# Patient Record
Sex: Female | Born: 1990 | Race: Black or African American | Hispanic: No | Marital: Single | Smoking: Never smoker
Health system: Southern US, Community
[De-identification: ages and names within clinical notes are randomized; demographics above are authoritative.]

## PROBLEM LIST (undated history)

## (undated) ENCOUNTER — Inpatient Hospital Stay (HOSPITAL_COMMUNITY): Payer: Self-pay

## (undated) DIAGNOSIS — R188 Other ascites: Secondary | ICD-10-CM

## (undated) DIAGNOSIS — Z789 Other specified health status: Secondary | ICD-10-CM

## (undated) DIAGNOSIS — R1900 Intra-abdominal and pelvic swelling, mass and lump, unspecified site: Secondary | ICD-10-CM

## (undated) HISTORY — PX: NO PAST SURGERIES: SHX2092

## (undated) HISTORY — PX: THORACENTESIS: SHX235

## (undated) HISTORY — PX: WISDOM TOOTH EXTRACTION: SHX21

---
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
## 2008-02-11 ENCOUNTER — Ambulatory Visit: Payer: Self-pay | Admitting: Family Medicine

## 2008-07-02 ENCOUNTER — Ambulatory Visit: Payer: Self-pay | Admitting: Family Medicine

## 2008-07-02 LAB — CONVERTED CEMR LAB: Beta hcg, urine, semiquantitative: NEGATIVE

## 2008-08-20 ENCOUNTER — Encounter (INDEPENDENT_AMBULATORY_CARE_PROVIDER_SITE_OTHER): Payer: Self-pay | Admitting: Family Medicine

## 2008-08-20 ENCOUNTER — Ambulatory Visit: Payer: Self-pay | Admitting: Family Medicine

## 2008-08-21 LAB — CONVERTED CEMR LAB
Chlamydia, DNA Probe: POSITIVE — AB
GC Probe Amp, Genital: NEGATIVE

## 2008-08-23 ENCOUNTER — Telehealth: Payer: Self-pay | Admitting: *Deleted

## 2008-08-23 ENCOUNTER — Ambulatory Visit: Payer: Self-pay | Admitting: Family Medicine

## 2008-09-30 ENCOUNTER — Ambulatory Visit: Payer: Self-pay | Admitting: Family Medicine

## 2008-12-30 ENCOUNTER — Ambulatory Visit: Payer: Self-pay | Admitting: Family Medicine

## 2009-01-17 ENCOUNTER — Ambulatory Visit: Payer: Self-pay | Admitting: Family Medicine

## 2009-05-03 ENCOUNTER — Telehealth (INDEPENDENT_AMBULATORY_CARE_PROVIDER_SITE_OTHER): Payer: Self-pay | Admitting: *Deleted

## 2009-06-27 ENCOUNTER — Ambulatory Visit: Payer: Self-pay | Admitting: Family Medicine

## 2009-06-27 LAB — CONVERTED CEMR LAB: Beta hcg, urine, semiquantitative: NEGATIVE

## 2009-08-29 ENCOUNTER — Encounter: Payer: Self-pay | Admitting: Family Medicine

## 2009-08-29 ENCOUNTER — Ambulatory Visit: Payer: Self-pay | Admitting: Family Medicine

## 2009-08-29 LAB — CONVERTED CEMR LAB: GC Probe Amp, Genital: NEGATIVE

## 2009-09-23 ENCOUNTER — Ambulatory Visit: Payer: Self-pay | Admitting: Family Medicine

## 2009-12-16 ENCOUNTER — Ambulatory Visit: Payer: Self-pay | Admitting: Family Medicine

## 2009-12-20 ENCOUNTER — Encounter: Payer: Self-pay | Admitting: Family Medicine

## 2009-12-21 ENCOUNTER — Ambulatory Visit: Payer: Self-pay | Admitting: Family Medicine

## 2009-12-21 DIAGNOSIS — B359 Dermatophytosis, unspecified: Secondary | ICD-10-CM | POA: Insufficient documentation

## 2010-05-09 NOTE — Assessment & Plan Note (Signed)
Summary: depo inj,tcb  Nurse Visit   Allergies: No Known Drug Allergies  Medication Administration  Injection # 1:    Medication: Depo-Provera 150mg     Diagnosis: CONTRACEPTIVE MANAGEMENT (ICD-V25.09)    Route: IM    Site: RUOQ gluteus    Exp Date: 06/2012    Lot #: [id number]    Mfr: [email or web address]    Comments: next depo due Nov 24 through Mar 16, 2010    Patient tolerated injection without complications    Given by: Theresia Lo RN (December 16, 2009 10:55 AM)  Orders Added: 1)  Depo-Provera 150mg  [J1055] 2)  Admin of Injection (IM/SQ) [84696]   Medication Administration  Injection # 1:    Medication: Depo-Provera 150mg     Diagnosis: CONTRACEPTIVE MANAGEMENT (ICD-V25.09)    Route: IM    Site: RUOQ gluteus    Exp Date: 06/2012    Lot #: [id number]    Mfr: [email or web address]    Comments: next depo due Nov 24 through Mar 16, 2010    Patient tolerated injection without complications    Given by: Theresia Lo RN (December 16, 2009 10:55 AM)  Orders Added: 1)  Depo-Provera 150mg  [J1055] 2)  Admin of Injection (IM/SQ) [13244]

## 2010-05-09 NOTE — Miscellaneous (Signed)
Summary: depo update  Clinical Lists Changes  Medications: Added new medication of DEPO-PROVERA 150 MG/ML SUSP (MEDROXYPROGESTERONE ACETATE) inject IM q 3 months

## 2010-05-09 NOTE — Progress Notes (Signed)
Summary: Shot Req  Phone Note Call from Patient Call back at [employer] [phone]   Caller: Patient Summary of Call: Would like copy of her shot records. Initial call taken by: Clydell Hakim,  May 03, 2009 10:15 AM  Follow-up for Phone Call        patient notified that record is ready to pick up. Follow-up by: Theresia Lo RN,  May 03, 2009 11:44 AM

## 2010-05-09 NOTE — Assessment & Plan Note (Signed)
Summary: 20 y/o CPE   Vital Signs:  Patient profile:   20 year old female Height:      62.5 inches Weight:      180.4 pounds BMI:     32.59 Temp:     98.4 degrees F oral Pulse rate:   70 / minute BP sitting:   109 / 70  Nutrition Counseling: Patient's BMI is greater than 25 and therefore counseled on weight management options.  Habits & Providers  Alcohol-Tobacco-Diet     Alcohol drinks/day: 0     Tobacco Status: never  Exercise-Depression-Behavior     Does Patient Exercise: no     Exercise Counseling: to improve exercise regimen     Have you felt down or hopeless? no     Have you felt little pleasure in things? no     STD Risk: past     STD Risk Counseling: to avoid increased STD risk     Drug Use: never     Seat Belt Use: always  Well Child Visit/Preventive Care  Age:  20 years old female Patient lives with: grandmother, 3 sibs Concerns: headaches frequently. tylenol and advil help. h/o migraine treated with imitrex in the past  Home:     good family relationships Education:     doing well in school.  Activities:     > 2 hrs TV/Computer Auto/Safety:     seatbelts Diet:     balanced diet and dental hygiene/visit addressed Drugs:     no tobacco use, no alcohol use, and no drug use Sex:     dating Suicide risk:     emotionally healthy, denies feelings of depression, and denies suicidal ideation  Past History:  Past medical, surgical, family and social histories (including risk factors) reviewed, and no changes noted (except as noted below).  Past Medical History: Reviewed history from 08/20/2008 and no changes required. 1/06 Wt= 123 lbs, Ht=5`1`, FT NSVD uncomplicated (patient has never been pregnant), prenatal valproate exposure, menstrual migraine, no hospitalizations   migraines  Past Surgical History: Reviewed history from 08/20/2008 and no changes required. none  Family History: Reviewed history from 08/20/2008 and no changes  required. gparents- HTN, DM, Mom DM, HTN, HLD.  sister healthy      Social History: Reviewed history from 08/20/2008 and no changes required. Lives with grandma and 3 sisters.  Mom is [place], sees occasionally. Currently has boyfriend in prison, so not currently sexually active. No tobacco, EtoH, rec drugs.  [employer] for criminal justice.  Supportive environment.   Goes walking sometimes. Sometimes she runs 15-20 minutes once a week.  Drug Use:  never STD Risk:  past Seat Belt Use:  always Does Patient Exercise:  no  Physical Exam  General:      obese female, NAD. vitals reviewed.  Eyes:      PERRL, EOMI,  fundi normal Ears:      TM's pearly gray with normal light reflex and landmarks, canals clear  Nose:      Clear without Rhinorrhea Mouth:      Clear without erythema, edema or exudate, mucous membranes moist Neck:      supple without adenopathy  Lungs:      Clear to ausc, no crackles, rhonchi or wheezing, no grunting, flaring or retractions  Heart:      RRR without murmur  Abdomen:      BS+, soft, non-tender, no masses, no hepatosplenomegaly  Genitalia:      Pelvic Exam External: normal female genitalia  without lesions or masses Vagina: normal without lesions or masses Cervix: normal without lesions or masses Adnexa: normal bimanual exam without masses or fullness Uterus: normal by palpation Pap smear: not performed Musculoskeletal:      no scoliosis, normal gait, normal posture Pulses:      femoral pulses present  Extremities:      Well perfused with no cyanosis or deformity noted  Neurologic:      Neurologic exam grossly intact  Developmental:      alert and cooperative  Skin:      intact without lesions, rashes  Psychiatric:      alert and cooperative   Impression & Recommendations:  Problem # 1:  PHYSICAL EXAMINATION (ICD-V70.0)  GC/Chl performed. wet prep unremarkable. no pap needed. f/u in 1 year.   Orders: [facility] - Est  18-39 yrs (04540)  Problem  # 2:  MIGRAINE, UNSPEC., W/O INTRACTABLE MIGRAINE (ICD-346.90) Assessment: Deteriorated refill sumatriptan, and rx for ibuprofen provided.   Her updated medication list for this problem includes:    Sumatriptan Succinate 50 Mg Tabs (Sumatriptan succinate) .Marland Kitchen... 1 tablet at onset of headache, may repeat again after 2 hours if headache still present.  max 4 tablets in 1 day.    Ibuprofen 800 Mg Tabs (Ibuprofen) ..... One tab by mouth q8 hours as needed for headache  Other Orders: GC/Chlamydia-[facility] (87591/87491) [facility]  Patient Instructions: 1)  It is important that you exercise reguarly at least 20 minutes 5 times a week. If you develop chest pain, have severe difficulty breathing, or feel very tired, stop exercising immediately and seek medical attention.  2)  You need to lose weight. Consider a lower calorie diet and regular exercise.  3)  If you could be exposed to sexually transmitted diseases. you should use a condom.  Prescriptions: SUMATRIPTAN SUCCINATE 50 MG TABS (SUMATRIPTAN SUCCINATE) 1 tablet at onset of headache, may repeat again after 2 hours if headache still present.  max 4 tablets in 1 day.  #12 x 1   Entered and Authorized by:   Lequita Asal  MD   Signed by:   Lequita Asal  MD on 08/29/2009   Method used:   Electronically to        [facility] (retail)       [address]       [place], [place]  [id number]       Ph: [phone]       Fax: [phone]   RxID:   [id number] IBUPROFEN 800 MG TABS (IBUPROFEN) one tab by mouth q8 hours as needed for headache  #90 x 1   Entered and Authorized by:   Lequita Asal  MD   Signed by:   Lequita Asal  MD on 08/29/2009   Method used:   Electronically to        [facility] [address] [address] (retail)       [address]       [place], [place]  [id number]       Ph: [phone]       Fax: [phone]   RxID:   [id number]  ] Laboratory Results  Date/Time  Received: Aug 29, 2009 10:46 AM  Date/Time Reported: Aug 29, 2009 10:56 AM   [employer] Source: VAG WBC/hpf: >20 Bacteria/hpf: 3+  Rods Clue cells/hpf: none  Negative whiff Yeast/hpf: none Trichomonas/hpf: none Comments: ...............test performed by......Marland KitchenBonnie A. Swaziland, MLS (ASCP)cm

## 2010-05-09 NOTE — Assessment & Plan Note (Signed)
Summary: spot on leg,tcb   Vital Signs:  Patient profile:   20 year old female Height:      62.5 inches Weight:      175.9 pounds BMI:     31.77 Temp:     98.5 degrees F oral Pulse rate:   64 / minute BP sitting:   107 / 65  (left arm) Cuff size:   regular  Vitals Entered By: Garen Grams LPN (December 21, 2009 2:38 PM) CC: knot on leg x 2 weeks Is Patient Diabetic? No Pain Assessment Patient in pain? no        Primary Care Provider:  Sarah Swaziland MD  CC:  knot on leg x 2 weeks.  History of Present Illness: 20 yo here for evaluation of spot on right leg x 2 weeks.  Itchy round spot on anterior right leg.  no fevers, recent outdoor acitvity, pets, or household members with similar spots.  no other areas ntoed.  Has not tried anythign on the area for treatment.  Habits & Providers  Alcohol-Tobacco-Diet     Alcohol drinks/day: 0     Tobacco Status: never  Current Medications (verified): 1)  Sumatriptan Succinate 50 Mg Tabs (Sumatriptan Succinate) .Marland Kitchen.. 1 Tablet At Onset of Headache, May Repeat Again After 2 Hours If Headache Still Present.  Max 4 Tablets in 1 Day. 2)  Ibuprofen 800 Mg Tabs (Ibuprofen) .... One Tab By Mouth Q8 Hours As Needed For Headache 3)  Depo-Provera 150 Mg/ml Susp (Medroxyprogesterone Acetate) .... Inject Im Q 3 Months  Allergies: No Known Drug Allergies PMH-FH-SH reviewed for relevance  Review of Systems      See HPI  Physical Exam  General:  obese female, NAD. vitals reviewed.  Skin:  approx 2 cm diameter red macule consistant with ringworm   Impression & Recommendations:  Problem # 1:  RINGWORM (ICD-110.9)  isolated lesion.  patient given hadnout from [email or web address] and advised to use lamisil OTC on area.  return if no improvement.  Orders: [facility]- Est Level  3 (95621)  Complete Medication List: 1)  Sumatriptan Succinate 50 Mg Tabs (Sumatriptan succinate) .Marland Kitchen.. 1 tablet at onset of headache, may repeat again after 2 hours if  headache still present.  max 4 tablets in 1 day. 2)  Ibuprofen 800 Mg Tabs (Ibuprofen) .... One tab by mouth q8 hours as needed for headache 3)  Depo-provera 150 Mg/ml Susp (Medroxyprogesterone acetate) .... Inject im q 3 months

## 2010-05-09 NOTE — Assessment & Plan Note (Signed)
Summary: depo,df  Nurse Visit   Allergies: No Known Drug Allergies  Medication Administration  Injection # 1:    Medication: Depo-Provera 150mg     Diagnosis: CONTRACEPTIVE MANAGEMENT (ICD-V25.09)    Route: IM    Site: RUOQ gluteus    Exp Date: 05/2012    Lot #: [id number]    Mfr: [email or web address]    Comments: next depo due Sept 2 thru Sept 16, 2011    Patient tolerated injection without complications    Given by: Theresia Lo RN (September 23, 2009 9:41 AM)  Orders Added: 1)  Depo-Provera 150mg  [J1055] 2)  Admin of Injection (IM/SQ) [11914]   Medication Administration  Injection # 1:    Medication: Depo-Provera 150mg     Diagnosis: CONTRACEPTIVE MANAGEMENT (ICD-V25.09)    Route: IM    Site: RUOQ gluteus    Exp Date: 05/2012    Lot #: [id number]    Mfr: [email or web address]    Comments: next depo due Sept 2 thru Sept 16, 2011    Patient tolerated injection without complications    Given by: Theresia Lo RN (September 23, 2009 9:41 AM)  Orders Added: 1)  Depo-Provera 150mg  [J1055] 2)  Admin of Injection (IM/SQ) [21308]

## 2010-05-09 NOTE — Assessment & Plan Note (Signed)
Summary: UPREG/DEPO/KH  Nurse Visit   Allergies: No Known Drug Allergies Laboratory Results   Urine Tests  Date/Time Received: June 27, 2009 1:31 PM  Date/Time Reported: June 27, 2009 1:40 PM     Urine HCG: negative Comments: ...........test performed by...........Marland KitchenTerese Door, CMA     Medication Administration  Injection # 1:    Medication: Depo-Provera 150mg     Diagnosis: CONTRACEPTIVE MANAGEMENT (ICD-V25.09)    Route: IM    Site: LUOQ gluteus    Exp Date: 08/2011    Lot #: [id number]    Mfr: [email or web address]    Comments: next depo due June 6 trhu September 26, 2009    Patient tolerated injection without complications    Given by: Theresia Lo RN (June 27, 2009 2:04 PM)  Orders Added: 1)  U Preg-[facility] [81025] 2)  Depo-Provera 150mg  [J1055] 3)  Admin of Injection (IM/SQ) [78469]    Medication Administration  Injection # 1:    Medication: Depo-Provera 150mg     Diagnosis: CONTRACEPTIVE MANAGEMENT (ICD-V25.09)    Route: IM    Site: LUOQ gluteus    Exp Date: 08/2011    Lot #: [id number]    Mfr: [email or web address]    Comments: next depo due June 6 trhu September 26, 2009    Patient tolerated injection without complications    Given by: Theresia Lo RN (June 27, 2009 2:04 PM)  Orders Added: 1)  U Preg-[facility] [81025] 2)  Depo-Provera 150mg  [J1055] 3)  Admin of Injection (IM/SQ) [41324]  patient denies any sexual  activity in several months. Depo given today with instruction to use extra protection for next 7 days if she does have sex. Theresia Lo RN  June 27, 2009 2:06 PM

## 2010-05-19 ENCOUNTER — Ambulatory Visit: Payer: Self-pay

## 2010-05-23 ENCOUNTER — Ambulatory Visit: Payer: Self-pay

## 2010-08-16 ENCOUNTER — Ambulatory Visit: Payer: Self-pay

## 2010-08-31 ENCOUNTER — Other Ambulatory Visit: Payer: Self-pay | Admitting: Family Medicine

## 2010-08-31 MED ORDER — IBUPROFEN 800 MG PO TABS: 800.0000 mg | ORAL_TABLET | Freq: Three times a day (TID) | ORAL | Status: DC | PRN

## 2010-10-12 ENCOUNTER — Other Ambulatory Visit: Payer: Self-pay | Admitting: Family Medicine

## 2010-11-16 ENCOUNTER — Ambulatory Visit (INDEPENDENT_AMBULATORY_CARE_PROVIDER_SITE_OTHER): Payer: Medicaid Other | Admitting: Family Medicine

## 2010-11-16 DIAGNOSIS — Z3009 Encounter for other general counseling and advice on contraception: Secondary | ICD-10-CM | POA: Insufficient documentation

## 2010-11-16 DIAGNOSIS — Z309 Encounter for contraceptive management, unspecified: Secondary | ICD-10-CM

## 2010-11-16 DIAGNOSIS — G43909 Migraine, unspecified, not intractable, without status migrainosus: Secondary | ICD-10-CM

## 2010-11-16 MED ORDER — MEDROXYPROGESTERONE ACETATE 150 MG/ML IM SUSP: 150.0000 mg | Freq: Once | INTRAMUSCULAR | Status: DC

## 2010-11-16 NOTE — Assessment & Plan Note (Signed)
Headaches 1-2/month.  No red flags, just like previous migraines.  Does well with ibuprofen.  Will refill

## 2010-11-16 NOTE — Progress Notes (Signed)
  Subjective:    Patient ID: Brandy Knight, female    DOB: 10/31/1990, 20 y.o.   MRN: [id number]  HPI HA- migraines 1-2/ month.  They are stronger this month but the character is the same.  She will occasionaly vomit with bad migraines.  The ibuprofen helps and she needs a refill     Review of Systems    Denies CP, SOB, N/V/D, fever  Objective:   Physical Exam  Vital signs reviewed General appearance - alert, well appearing, and in no distress and oriented to person, place, and time Neurological - alert, oriented, normal speech, no focal findings or movement disorder noted, screening mental status exam normal, neck supple without rigidity, cranial nerves II through XII intact       Assessment & Plan:  Family planning Liked depo, would like to start again.  Will restart today. upreg neg  MIGRAINE, UNSPEC., W/O INTRACTABLE MIGRAINE Headaches 1-2/month.  No red flags, just like previous migraines.  Does well with ibuprofen.  Will refill

## 2010-11-16 NOTE — Assessment & Plan Note (Signed)
Liked depo, would like to start again.  Will restart today. upreg neg

## 2011-02-05 ENCOUNTER — Ambulatory Visit: Payer: Medicaid Other | Admitting: Family Medicine

## 2012-07-04 ENCOUNTER — Ambulatory Visit (INDEPENDENT_AMBULATORY_CARE_PROVIDER_SITE_OTHER): Payer: Medicaid Other | Admitting: Family Medicine

## 2012-07-04 VITALS — BP 100/58 | HR 72 | Temp 98.3°F | Ht 62.0 in | Wt 176.8 lb

## 2012-07-04 DIAGNOSIS — J029 Acute pharyngitis, unspecified: Secondary | ICD-10-CM

## 2012-07-04 DIAGNOSIS — B9789 Other viral agents as the cause of diseases classified elsewhere: Secondary | ICD-10-CM

## 2012-07-04 DIAGNOSIS — J028 Acute pharyngitis due to other specified organisms: Secondary | ICD-10-CM | POA: Insufficient documentation

## 2012-07-04 DIAGNOSIS — R070 Pain in throat: Secondary | ICD-10-CM

## 2012-07-04 NOTE — Patient Instructions (Signed)
I am sorry your throat hurts- I think you have a virus causing your sore throat.  Please drink plenty of fluids.  You can take tylenol or ibuprofen for the pain, and try chloraseptic sore throat spray.

## 2012-07-04 NOTE — Assessment & Plan Note (Signed)
Rapid strep negative, Discussed viral pharyngitis and supportive care (see pt instructions).  F/U if not improving in 1 week.

## 2012-07-04 NOTE — Progress Notes (Signed)
  Subjective:    Patient ID: Brandy Knight, female    DOB: Jun 25, 1990, 22 y.o.   MRN: [id number]  HPI  Arina comes in with a sore throat that has been going on for 3-4 days.  She says she noticed a little discomfort at work a few days ago and thought she had just not drank enough water.  Then the next morning she says it was very sore and her voice is quieter than normal.  It is uncomfortable to swallow. She has felt generally sick but not had a fever.  No sick contacts but she does work at [employer].    Review of Systems See HPI    Objective:   Physical Exam BP 100/58  Pulse 72  Temp(Src) 98.3 F (36.8 C) (Oral)  Ht 5\' 2"  (1.575 m)  Wt 176 lb 12.8 oz (80.196 kg)  BMI 32.33 kg/m2  LMP 06/23/2012 General appearance: alert, cooperative and no distress Ears: normal TM's and external ear canals both ears Nose: mild congestion Throat: Mild erythema on tonsills but no exudates, oral mucosa moist Neck: mild anterior cervical adenopathy and supple, symmetrical, trachea midline Lungs: clear to auscultation bilaterally Heart: regular rate and rhythm, S1, S2 normal, no murmur, click, rub or gallop       Assessment & Plan:

## 2012-08-04 ENCOUNTER — Ambulatory Visit: Payer: Medicaid Other | Admitting: Family Medicine

## 2013-04-09 NOTE — L&D Delivery Note (Signed)
Delivery Note At 7:15 AM a viable female was delivered via Vaginal, Spontaneous Delivery (Presentation: Left Occiput Anterior).  APGAR: 9, 9; weight pending.   Placenta status: Intact, Spontaneous.  Cord: 3 vessels with the following complications: None.  Cord pH: N/A  Anesthesia: None  Episiotomy: None Lacerations: 2nd degree;Perineal Suture Repair: 3.0 vicryl Est. Blood Loss (mL): 300 ml   Mom to postpartum.  Baby to Couplet care / Skin to Skin.  Kennith Maes 09/16/2013, 7:55 AM    I was present for this delivery and agree with the above resident's note.  [place], [place] Certified Nurse-Midwife

## 2013-04-16 ENCOUNTER — Ambulatory Visit (INDEPENDENT_AMBULATORY_CARE_PROVIDER_SITE_OTHER): Payer: Medicaid Other | Admitting: Family Medicine

## 2013-04-16 VITALS — BP 111/69 | HR 93 | Temp 98.6°F | Ht 62.0 in | Wt 152.0 lb

## 2013-04-16 DIAGNOSIS — N912 Amenorrhea, unspecified: Secondary | ICD-10-CM

## 2013-04-16 DIAGNOSIS — Z331 Pregnant state, incidental: Secondary | ICD-10-CM

## 2013-04-16 DIAGNOSIS — Z3490 Encounter for supervision of normal pregnancy, unspecified, unspecified trimester: Secondary | ICD-10-CM

## 2013-04-16 LAB — POCT URINE PREGNANCY: Preg Test, Ur: POSITIVE

## 2013-04-16 NOTE — Progress Notes (Signed)
Family Medicine Office Visit Note   Subjective:   Patient ID: Brandy Knight, female  DOB: [birth year], 23 y.o.. MRN: [id number]   Pt that comes today for same-day appointment complaining of amenorrhea for ~ 4 months. She reports her LMP was sometime in September, then she did not have her period in October and had some spotting in Nov and Dec. She denies nausea, vomiting or other symptoms. She thinks is pregnant an would like to get tested for it.  Denies vaginal discharge,or leakage.   Review of Systems:  Pt denies SOB, chest pain, palpitations, headaches, dizziness, numbness or weakness. No changes on urinary or BM habits. Weight loss of 24 Lb in 9 months.   Objective:   Physical Exam: Gen:  NAD HEENT: Moist mucous membranes  CV: Regular rate and rhythm, no murmurs PULM: Normal effort. Clear to auscultation bilaterally.  ABD: Soft, non tender, non distended, normal bowel sounds. FHT present on doppler exam (125bpm). Uterine size felt to be halfway between umbilicus and pubic symphysis.   EXT: No edema Neuro: Alert and oriented x3.  Assessment & Plan:

## 2013-04-16 NOTE — Patient Instructions (Signed)
Congratulations in your pregnancy! Since your period was irregular and there is heart beat in our exam you need to get ultrasound in order to determine the date of pregnancy.  You also need to come for prenatal labs and after that you will be scheduled for your first OB visit here.

## 2013-04-17 DIAGNOSIS — O0990 Supervision of high risk pregnancy, unspecified, unspecified trimester: Secondary | ICD-10-CM | POA: Insufficient documentation

## 2013-04-17 DIAGNOSIS — Z3490 Encounter for supervision of normal pregnancy, unspecified, unspecified trimester: Secondary | ICD-10-CM | POA: Insufficient documentation

## 2013-04-17 NOTE — Assessment & Plan Note (Signed)
Pregnancy test positive. Pt with no planned pregnancy but happy with results and desires to continue pregnancy. Unreliable dating. Ultrasound ordered. Instructed to come for Nurse Visit in order to get OB labs drawn and then start corresponding prenatal care. She wishes to f/u in our [facility] clinic.

## 2013-04-20 ENCOUNTER — Ambulatory Visit (HOSPITAL_COMMUNITY): Payer: Medicaid Other

## 2013-04-27 ENCOUNTER — Ambulatory Visit (HOSPITAL_COMMUNITY): Payer: Medicaid Other

## 2013-04-30 ENCOUNTER — Encounter (HOSPITAL_COMMUNITY): Payer: Self-pay

## 2013-04-30 ENCOUNTER — Inpatient Hospital Stay (HOSPITAL_COMMUNITY)
Admission: AD | Admit: 2013-04-30 | Discharge: 2013-04-30 | Disposition: A | Discharge status: Home / Self Care | Payer: Medicaid Other | Source: Ambulatory Visit | Attending: Obstetrics & Gynecology | Admitting: Obstetrics & Gynecology

## 2013-04-30 DIAGNOSIS — O9989 Other specified diseases and conditions complicating pregnancy, childbirth and the puerperium: Principal | ICD-10-CM

## 2013-04-30 DIAGNOSIS — R109 Unspecified abdominal pain: Secondary | ICD-10-CM | POA: Insufficient documentation

## 2013-04-30 DIAGNOSIS — O99891 Other specified diseases and conditions complicating pregnancy: Secondary | ICD-10-CM | POA: Insufficient documentation

## 2013-04-30 DIAGNOSIS — N949 Unspecified condition associated with female genital organs and menstrual cycle: Secondary | ICD-10-CM

## 2013-04-30 HISTORY — DX: Other specified health status: Z78.9

## 2013-04-30 LAB — URINALYSIS, ROUTINE W REFLEX MICROSCOPIC
Bilirubin Urine: NEGATIVE
Glucose, UA: NEGATIVE mg/dL
Hgb urine dipstick: NEGATIVE
Ketones, ur: NEGATIVE mg/dL
Leukocytes, UA: NEGATIVE
Nitrite: NEGATIVE
PH: 8 (ref 5.0–8.0)
PROTEIN: NEGATIVE mg/dL
SPECIFIC GRAVITY, URINE: 1.02 (ref 1.005–1.030)
UROBILINOGEN UA: 0.2 mg/dL (ref 0.0–1.0)

## 2013-04-30 LAB — WET PREP, GENITAL
Clue Cells Wet Prep HPF POC: NONE SEEN
Trich, Wet Prep: NONE SEEN
Yeast Wet Prep HPF POC: NONE SEEN

## 2013-04-30 LAB — OB RESULTS CONSOLE GC/CHLAMYDIA
Chlamydia: NEGATIVE
Gonorrhea: NEGATIVE

## 2013-04-30 MED ORDER — ACETAMINOPHEN 500 MG PO TABS: 1000.0000 mg | ORAL_TABLET | Freq: Once | ORAL | Status: DC

## 2013-04-30 NOTE — MAU Provider Note (Signed)
Attestation of Attending Supervision of Advanced Practitioner (CNM/NP): Evaluation and management procedures were performed by the Advanced Practitioner under my supervision and collaboration. I have reviewed the Advanced Practitioner's note and chart, and I agree with the management and plan.  LEGGETT,KELLY H. 4:45 PM

## 2013-04-30 NOTE — MAU Note (Signed)
Patient states she has been having lower abdominal pain for about one week off and on. Denies bleeding, nausea or vomiting, sore throat or cough.

## 2013-04-30 NOTE — MAU Provider Note (Signed)
History     CSN: [id number]  Arrival date and time: 04/30/13 1337   First Provider Initiated Contact with Patient 04/30/13 1414      Chief Complaint  Patient presents with  . Abdominal Pain   HPI  Brandy Knight is a 23 yo G1P0 at [redacted]w[redacted]d according to her LMP who presents with lower abdominal pain x 3 days.  Patient reports the pain as sharp, intermittent and radiates to her back. States the pain is worse while lying down and is somewhat relieved while standing.  She denies GI, urinary and vaginal bleeding and/or discharge.  OB History   Grav Para Term Preterm Abortions TAB SAB Ect Mult Living   1               Past Medical History  Diagnosis Date  . Medical history non-contributory     Past Surgical History  Procedure Laterality Date  . Wisdom tooth extraction      History reviewed. No pertinent family history.  History  Substance Use Topics  . Smoking status: Never Smoker   . Smokeless tobacco: Not on file  . Alcohol Use: No    Allergies: No Known Allergies  Facility-administered medications prior to admission  Medication Dose Route Frequency Provider Last Rate Last Dose  . medroxyPROGESTERone (DEPO-PROVERA) injection 150 mg  150 mg Intramuscular Once Judithann Sheen, MD       No prescriptions prior to admission    Review of Systems  Constitutional: Negative for fever and chills.  Eyes: Negative for blurred vision.  Respiratory: Negative for cough.   Cardiovascular: Negative for orthopnea and leg swelling.  Gastrointestinal: Positive for abdominal pain. Negative for nausea, vomiting, diarrhea and constipation.  Genitourinary: Negative for dysuria, urgency, frequency, hematuria and flank pain.  Musculoskeletal: Positive for back pain.  Neurological: Negative for dizziness.   Physical Exam   Blood pressure 111/63, pulse 77, resp. rate 16, height 5' 2.5" (1.588 m), weight 67.677 kg (149 lb 3.2 oz), last menstrual period 01/05/2013, SpO2  100.00%.  Physical Exam  Constitutional: She appears well-developed and well-nourished. No distress.  HENT:  Head: Normocephalic.  Cardiovascular: Normal rate and normal heart sounds.   Respiratory: Effort normal and breath sounds normal.  GI: Soft. Bowel sounds are normal. There is tenderness in the right lower quadrant and left lower quadrant.  Genitourinary:  Speculum exam:  Vagina:  No blood noted in vault.  No erythema. No tenderness. Small amount of thin, white discharge noted.  Cervix:  No blood/fluid seen from os. No external erythema or lesions.  Bimanual exam:  Uterus:  Non tender Cervix: No CMT Adnexal: No tenderness and/or masses noted. Wet prep and GC/Chlamydia collected. Chaperone present for exam    MAU Course  Procedures  MDM FHR - 150 bpm with doppler Urinalysis Wet prep  GC/Chlamydia    Assessment and Plan   Assessment:  #Round ligament pain-Intermittent lower abdominal pain that is positional and presents in the second trimester is indicative of round ligament pain.  Common infectious etiologies have been ruled out with negative urinalysis and wet prep.  GC/chlamydia is pending.   Plan:  1. Discharge in stable condition 2. Reassurance and instruction on common remedies for round ligament pain such as OTC acetaminophen, warm bath/shower and abdominal binder. 3. Establish prenatal care in the [facility] at [facility]. Referral sent to [facility] clinic. They will call the patient with an appointment in [place] 4. Patient given note with lifting restrictions for work 5. Patient  given pregnancy confirmation letter 6. Return to the MAU as needed or if condition were to change or worsen  Toil[place], Tifi 04/30/2013, 2:30 PM   I have seen and evaluated the patient with the PA student. I agree with the assessment and plan as written above.   Farris Has, PA-C 04/30/2013 3:12 PM

## 2013-04-30 NOTE — Discharge Instructions (Signed)
Abdominal Pain During Pregnancy  Belly (abdominal) pain is common during pregnancy. Most of the time, it is not a serious problem. Other times, it can be a sign that something is wrong with the pregnancy. Always tell your doctor if you have belly pain.  HOME CARE  Monitor your belly pain for any changes. The following actions may help you feel better:  · Do not have sex (intercourse) or put anything in your vagina until you feel better.  · Rest until your pain stops.  · Drink clear fluids if you feel sick to your stomach (nauseous). Do not eat solid food until you feel better.  · Only take medicine as told by your doctor.  · Keep all doctor visits as told.  GET HELP RIGHT AWAY IF:   · You are bleeding, leaking fluid, or pieces of tissue come out of your vagina.  · You have more pain or cramping.  · You keep throwing up (vomiting).  · You have pain when you pee (urinate) or have blood in your pee.  · You have a fever.  · You do not feel your baby moving as much.  · You feel very weak or feel like passing out.  · You have trouble breathing, with or without belly pain.  · You have a very bad headache and belly pain.  · You have fluid leaking from your vagina and belly pain.  · You keep having watery poop (diarrhea).  · Your belly pain does not go away after resting, or the pain gets worse.  MAKE SURE YOU:   · Understand these instructions.  · Will watch your condition.  · Will get help right away if you are not doing well or get worse.  Document Released: 03/14/2009 Document Revised: 11/26/2012 Document Reviewed: 10/23/2012  Exit[employer]® Patient Information ©2014 [employer].

## 2013-05-01 LAB — GC/CHLAMYDIA PROBE AMP
CT Probe RNA: NEGATIVE
GC PROBE AMP APTIMA: NEGATIVE

## 2013-05-12 ENCOUNTER — Other Ambulatory Visit: Payer: Medicaid Other

## 2013-05-12 DIAGNOSIS — Z331 Pregnant state, incidental: Secondary | ICD-10-CM

## 2013-05-12 NOTE — Progress Notes (Signed)
OB LABS DONE TODAY Brandy Knight

## 2013-05-13 LAB — OBSTETRIC PANEL
ANTIBODY SCREEN: NEGATIVE
BASOS PCT: 0 % (ref 0–1)
Basophils Absolute: 0 10*3/uL (ref 0.0–0.1)
EOS ABS: 0.1 10*3/uL (ref 0.0–0.7)
Eosinophils Relative: 1 % (ref 0–5)
HEMATOCRIT: 30.9 % — AB (ref 36.0–46.0)
Hemoglobin: 10.4 g/dL — ABNORMAL LOW (ref 12.0–15.0)
Hepatitis B Surface Ag: NEGATIVE
Lymphocytes Relative: 17 % (ref 12–46)
Lymphs Abs: 1.8 10*3/uL (ref 0.7–4.0)
MCH: 25.3 pg — AB (ref 26.0–34.0)
MCHC: 33.7 g/dL (ref 30.0–36.0)
MCV: 75.2 fL — AB (ref 78.0–100.0)
Monocytes Absolute: 0.7 10*3/uL (ref 0.1–1.0)
Monocytes Relative: 6 % (ref 3–12)
NEUTROS ABS: 8.1 10*3/uL — AB (ref 1.7–7.7)
NEUTROS PCT: 76 % (ref 43–77)
PLATELETS: 189 10*3/uL (ref 150–400)
RBC: 4.11 MIL/uL (ref 3.87–5.11)
RDW: 15.9 % — ABNORMAL HIGH (ref 11.5–15.5)
RH TYPE: POSITIVE
Rubella: 0.87 Index (ref <0.90)
WBC: 10.7 10*3/uL — ABNORMAL HIGH (ref 4.0–10.5)

## 2013-05-13 LAB — SICKLE CELL SCREEN: SICKLE CELL SCREEN: NEGATIVE

## 2013-05-13 LAB — HIV ANTIBODY (ROUTINE TESTING W REFLEX): HIV: NONREACTIVE

## 2013-05-14 LAB — CULTURE, OB URINE

## 2013-05-18 ENCOUNTER — Encounter: Payer: Self-pay | Admitting: Family Medicine

## 2013-05-18 ENCOUNTER — Ambulatory Visit (INDEPENDENT_AMBULATORY_CARE_PROVIDER_SITE_OTHER): Payer: Medicaid Other | Admitting: Family Medicine

## 2013-05-18 ENCOUNTER — Other Ambulatory Visit (HOSPITAL_COMMUNITY)
Admission: RE | Admit: 2013-05-18 | Discharge: 2013-05-18 | Disposition: A | Discharge status: Home / Self Care | Payer: Medicaid Other | Source: Ambulatory Visit | Attending: Family Medicine | Admitting: Family Medicine

## 2013-05-18 VITALS — BP 109/53 | Temp 98.9°F | Wt 154.7 lb

## 2013-05-18 DIAGNOSIS — Z3490 Encounter for supervision of normal pregnancy, unspecified, unspecified trimester: Secondary | ICD-10-CM

## 2013-05-18 DIAGNOSIS — Z331 Pregnant state, incidental: Secondary | ICD-10-CM

## 2013-05-18 DIAGNOSIS — A630 Anogenital (venereal) warts: Secondary | ICD-10-CM

## 2013-05-18 DIAGNOSIS — Z01419 Encounter for gynecological examination (general) (routine) without abnormal findings: Secondary | ICD-10-CM | POA: Insufficient documentation

## 2013-05-18 DIAGNOSIS — Z3400 Encounter for supervision of normal first pregnancy, unspecified trimester: Secondary | ICD-10-CM

## 2013-05-18 HISTORY — DX: Anogenital (venereal) warts: A63.0

## 2013-05-18 MED ORDER — CEPHALEXIN 500 MG PO CAPS: 500.0000 mg | ORAL_CAPSULE | Freq: Four times a day (QID) | ORAL | Status: DC

## 2013-05-18 NOTE — Assessment & Plan Note (Signed)
May be candidate for Trichloroacetic acid for removal.  Avoid teratogenic agents for now.  F/U in 4 weeks

## 2013-05-18 NOTE — Patient Instructions (Signed)
Second Trimester of Pregnancy The second trimester is from week 13 through week 28, months 4 through 6. The second trimester is often a time when you feel your best. Your body has also adjusted to being pregnant, and you begin to feel better physically. Usually, morning sickness has lessened or quit completely, you may have more energy, and you may have an increase in appetite. The second trimester is also a time when the fetus is growing rapidly. At the end of the sixth month, the fetus is about 9 inches long and weighs about 1 pounds. You will likely begin to feel the baby move (quickening) between 18 and 20 weeks of the pregnancy. BODY CHANGES Your body goes through many changes during pregnancy. The changes vary from woman to woman.   Your weight will continue to increase. You will notice your lower abdomen bulging out.  You may begin to get stretch marks on your hips, abdomen, and breasts.  You may develop headaches that can be relieved by medicines approved by your caregiver.  You may urinate more often because the fetus is pressing on your bladder.  You may develop or continue to have heartburn as a result of your pregnancy.  You may develop constipation because certain hormones are causing the muscles that push waste through your intestines to slow down.  You may develop hemorrhoids or swollen, bulging veins (varicose veins).  You may have back pain because of the weight gain and pregnancy hormones relaxing your joints between the bones in your pelvis and as a result of a shift in weight and the muscles that support your balance.  Your breasts will continue to grow and be tender.  Your gums may bleed and may be sensitive to brushing and flossing.  Dark spots or blotches (chloasma, mask of pregnancy) may develop on your face. This will likely fade after the baby is born.  A dark line from your belly button to the pubic area (linea nigra) may appear. This will likely fade after the  baby is born. WHAT TO EXPECT AT YOUR PRENATAL VISITS During a routine prenatal visit:  You will be weighed to make sure you and the fetus are growing normally.  Your blood pressure will be taken.  Your abdomen will be measured to track your baby's growth.  The fetal heartbeat will be listened to.  Any test results from the previous visit will be discussed. Your caregiver may ask you:  How you are feeling.  If you are feeling the baby move.  If you have had any abnormal symptoms, such as leaking fluid, bleeding, severe headaches, or abdominal cramping.  If you have any questions. Other tests that may be performed during your second trimester include:  Blood tests that check for:  Low iron levels (anemia).  Gestational diabetes (between 24 and 28 weeks).  Rh antibodies.  Urine tests to check for infections, diabetes, or protein in the urine.  An ultrasound to confirm the proper growth and development of the baby.  An amniocentesis to check for possible genetic problems.  Fetal screens for spina bifida and Down syndrome. HOME CARE INSTRUCTIONS   Avoid all smoking, herbs, alcohol, and unprescribed drugs. These chemicals affect the formation and growth of the baby.  Follow your caregiver's instructions regarding medicine use. There are medicines that are either safe or unsafe to take during pregnancy.  Exercise only as directed by your caregiver. Experiencing uterine cramps is a good sign to stop exercising.  Continue to eat regular,   healthy meals.  Wear a good support bra for breast tenderness.  Do not use hot tubs, steam rooms, or saunas.  Wear your seat belt at all times when driving.  Avoid raw meat, uncooked cheese, cat litter boxes, and soil used by cats. These carry germs that can cause birth defects in the baby.  Take your prenatal vitamins.  Try taking a stool softener (if your caregiver approves) if you develop constipation. Eat more high-fiber foods,  such as fresh vegetables or fruit and whole grains. Drink plenty of fluids to keep your urine clear or pale yellow.  Take warm sitz baths to soothe any pain or discomfort caused by hemorrhoids. Use hemorrhoid cream if your caregiver approves.  If you develop varicose veins, wear support hose. Elevate your feet for 15 minutes, 3 4 times a day. Limit salt in your diet.  Avoid heavy lifting, wear low heel shoes, and practice good posture.  Rest with your legs elevated if you have leg cramps or low back pain.  Visit your dentist if you have not gone yet during your pregnancy. Use a soft toothbrush to brush your teeth and be gentle when you floss.  A sexual relationship may be continued unless your caregiver directs you otherwise.  Continue to go to all your prenatal visits as directed by your caregiver. SEEK MEDICAL CARE IF:   You have dizziness.  You have mild pelvic cramps, pelvic pressure, or nagging pain in the abdominal area.  You have persistent nausea, vomiting, or diarrhea.  You have a bad smelling vaginal discharge.  You have pain with urination. SEEK IMMEDIATE MEDICAL CARE IF:   You have a fever.  You are leaking fluid from your vagina.  You have spotting or bleeding from your vagina.  You have severe abdominal cramping or pain.  You have rapid weight gain or loss.  You have shortness of breath with chest pain.  You notice sudden or extreme swelling of your face, hands, ankles, feet, or legs.  You have not felt your baby move in over an hour.  You have severe headaches that do not go away with medicine.  You have vision changes. Document Released: 03/20/2001 Document Revised: 11/26/2012 Document Reviewed: 05/27/2012 [employer] Patient Information 2014 [employer].  

## 2013-05-18 NOTE — Progress Notes (Signed)
Subjective:    Brandy Knight is being seen today for her first obstetrical visit.  This is not a planned pregnancy. She is at [redacted]w[redacted]d gestation. Her obstetrical history is significant for nothing. Relationship with FOB: significant other, not living together. Patient does intend to breast feed. Pregnancy history fully reviewed.  Menstrual History: OB History   Grav Para Term Preterm Abortions TAB SAB Ect Mult Living   1               Menarche age: 23  Patient's last menstrual period was 01/05/2013.    The following portions of the patient's history were reviewed and updated as appropriate: allergies, current medications, past family history, past medical history, past social history, past surgical history and problem list.  Review of Systems Pertinent items are noted in HPI.    Objective:    BP 109/53  Temp(Src) 98.9 F (37.2 C)  Wt 154 lb 11.2 oz (70.171 kg)  LMP 01/05/2013  General Appearance:    Alert, cooperative, no distress, appears stated age  Head:    Normocephalic, without obvious abnormality, atraumatic  Eyes:    PERRL, conjunctiva/corneas clear, EOM's intact, fundi    benign, both eyes  Ears:    Normal TM's and external ear canals, both ears  Nose:   Nares normal, septum midline, mucosa normal, no drainage    or sinus tenderness  Throat:   Lips, mucosa, and tongue normal; teeth and gums normal  Neck:   Supple, symmetrical, trachea midline, no adenopathy;    thyroid:  no enlargement/tenderness/nodules; no carotid   bruit or JVD  Back:     Symmetric, no curvature, ROM normal, no CVA tenderness  Lungs:     Clear to auscultation bilaterally, respirations unlabored  Chest Wall:    No tenderness or deformity   Heart:    Regular rate and rhythm, S1 and S2 normal, no murmur, rub   or gallop  Breast Exam:    No tenderness, masses, or nipple abnormality  Abdomen:     Soft, non-tender, bowel sounds active all four quadrants,    no masses, no organomegaly  Genitalia:     Normal female without lesion, discharge or tenderness  Rectal:    Normal tone, normal prostate, no masses or tenderness;   guaiac negative stool  Extremities:   Extremities normal, atraumatic, no cyanosis or edema  Pulses:   2+ and symmetric all extremities  Skin:   Skin color, texture, turgor normal, no rashes or lesions  Lymph nodes:   Cervical, supraclavicular, and axillary nodes normal  Neurologic:   CNII-XII intact, normal strength, sensation and reflexes    throughout      Assessment:    Pregnancy at 19 and 0/7 weeks    Plan:    Initial labs reviewed.  Pap obtained  Prenatal vitamins. Problem list reviewed and updated. Role of ultrasound in pregnancy discussed; fetal survey: ordered. Follow up in 4 weeks. UCx + for [facility].   Will tx with Keflex 500 mg QID x 7 days and Test of Cure at next visit.  Pregnancy Medical Home form completed.  PHQ 9 = 0 today.  50% of 60 min visit spent on counseling and coordination of care.

## 2013-05-21 ENCOUNTER — Ambulatory Visit (HOSPITAL_COMMUNITY)
Admission: RE | Admit: 2013-05-21 | Discharge: 2013-05-21 | Disposition: A | Discharge status: Home / Self Care | Payer: Medicaid Other | Source: Ambulatory Visit | Attending: Family Medicine | Admitting: Family Medicine

## 2013-05-21 DIAGNOSIS — Z3400 Encounter for supervision of normal first pregnancy, unspecified trimester: Secondary | ICD-10-CM

## 2013-05-21 DIAGNOSIS — Z3689 Encounter for other specified antenatal screening: Secondary | ICD-10-CM | POA: Insufficient documentation

## 2013-06-12 ENCOUNTER — Encounter: Payer: Medicaid Other | Admitting: Obstetrics and Gynecology

## 2013-06-15 ENCOUNTER — Encounter: Payer: Medicaid Other | Admitting: Family Medicine

## 2013-08-04 ENCOUNTER — Encounter: Payer: Self-pay | Admitting: Family Medicine

## 2013-08-04 ENCOUNTER — Ambulatory Visit (INDEPENDENT_AMBULATORY_CARE_PROVIDER_SITE_OTHER): Payer: Medicaid Other | Admitting: Family Medicine

## 2013-08-04 VITALS — BP 110/61 | HR 94 | Temp 99.2°F | Wt 165.0 lb

## 2013-08-04 DIAGNOSIS — Z3400 Encounter for supervision of normal first pregnancy, unspecified trimester: Secondary | ICD-10-CM

## 2013-08-04 DIAGNOSIS — Z23 Encounter for immunization: Secondary | ICD-10-CM

## 2013-08-04 LAB — GLUCOSE, CAPILLARY: GLUCOSE-CAPILLARY: 103 mg/dL — AB (ref 70–99)

## 2013-08-04 NOTE — Addendum Note (Signed)
Addended by: Levert Feinstein F on: 08/04/2013 02:55 PM   Modules accepted: Orders

## 2013-08-04 NOTE — Patient Instructions (Signed)
Second Trimester of Pregnancy The second trimester is from week 13 through week 28, months 4 through 6. The second trimester is often a time when you feel your best. Your body has also adjusted to being pregnant, and you begin to feel better physically. Usually, morning sickness has lessened or quit completely, you may have more energy, and you may have an increase in appetite. The second trimester is also a time when the fetus is growing rapidly. At the end of the sixth month, the fetus is about 9 inches long and weighs about 1 pounds. You will likely begin to feel the baby move (quickening) between 18 and 20 weeks of the pregnancy. BODY CHANGES Your body goes through many changes during pregnancy. The changes vary from woman to woman.   Your weight will continue to increase. You will notice your lower abdomen bulging out.  You may begin to get stretch marks on your hips, abdomen, and breasts.  You may develop headaches that can be relieved by medicines approved by your caregiver.  You may urinate more often because the fetus is pressing on your bladder.  You may develop or continue to have heartburn as a result of your pregnancy.  You may develop constipation because certain hormones are causing the muscles that push waste through your intestines to slow down.  You may develop hemorrhoids or swollen, bulging veins (varicose veins).  You may have back pain because of the weight gain and pregnancy hormones relaxing your joints between the bones in your pelvis and as a result of a shift in weight and the muscles that support your balance.  Your breasts will continue to grow and be tender.  Your gums may bleed and may be sensitive to brushing and flossing.  Dark spots or blotches (chloasma, mask of pregnancy) may develop on your face. This will likely fade after the baby is born.  A dark line from your belly button to the pubic area (linea nigra) may appear. This will likely fade after the  baby is born. WHAT TO EXPECT AT YOUR PRENATAL VISITS During a routine prenatal visit:  You will be weighed to make sure you and the fetus are growing normally.  Your blood pressure will be taken.  Your abdomen will be measured to track your baby's growth.  The fetal heartbeat will be listened to.  Any test results from the previous visit will be discussed. Your caregiver may ask you:  How you are feeling.  If you are feeling the baby move.  If you have had any abnormal symptoms, such as leaking fluid, bleeding, severe headaches, or abdominal cramping.  If you have any questions. Other tests that may be performed during your second trimester include:  Blood tests that check for:  Low iron levels (anemia).  Gestational diabetes (between 24 and 28 weeks).  Rh antibodies.  Urine tests to check for infections, diabetes, or protein in the urine.  An ultrasound to confirm the proper growth and development of the baby.  An amniocentesis to check for possible genetic problems.  Fetal screens for spina bifida and Down syndrome. HOME CARE INSTRUCTIONS   Avoid all smoking, herbs, alcohol, and unprescribed drugs. These chemicals affect the formation and growth of the baby.  Follow your caregiver's instructions regarding medicine use. There are medicines that are either safe or unsafe to take during pregnancy.  Exercise only as directed by your caregiver. Experiencing uterine cramps is a good sign to stop exercising.  Continue to eat regular,   healthy meals.  Wear a good support bra for breast tenderness.  Do not use hot tubs, steam rooms, or saunas.  Wear your seat belt at all times when driving.  Avoid raw meat, uncooked cheese, cat litter boxes, and soil used by cats. These carry germs that can cause birth defects in the baby.  Take your prenatal vitamins.  Try taking a stool softener (if your caregiver approves) if you develop constipation. Eat more high-fiber foods,  such as fresh vegetables or fruit and whole grains. Drink plenty of fluids to keep your urine clear or pale yellow.  Take warm sitz baths to soothe any pain or discomfort caused by hemorrhoids. Use hemorrhoid cream if your caregiver approves.  If you develop varicose veins, wear support hose. Elevate your feet for 15 minutes, 3 4 times a day. Limit salt in your diet.  Avoid heavy lifting, wear low heel shoes, and practice good posture.  Rest with your legs elevated if you have leg cramps or low back pain.  Visit your dentist if you have not gone yet during your pregnancy. Use a soft toothbrush to brush your teeth and be gentle when you floss.  A sexual relationship may be continued unless your caregiver directs you otherwise.  Continue to go to all your prenatal visits as directed by your caregiver. SEEK MEDICAL CARE IF:   You have dizziness.  You have mild pelvic cramps, pelvic pressure, or nagging pain in the abdominal area.  You have persistent nausea, vomiting, or diarrhea.  You have a bad smelling vaginal discharge.  You have pain with urination. SEEK IMMEDIATE MEDICAL CARE IF:   You have a fever.  You are leaking fluid from your vagina.  You have spotting or bleeding from your vagina.  You have severe abdominal cramping or pain.  You have rapid weight gain or loss.  You have shortness of breath with chest pain.  You notice sudden or extreme swelling of your face, hands, ankles, feet, or legs.  You have not felt your baby move in over an hour.  You have severe headaches that do not go away with medicine.  You have vision changes. Document Released: 03/20/2001 Document Revised: 11/26/2012 Document Reviewed: 05/27/2012 [employer] Patient Information 2014 [employer].  

## 2013-08-04 NOTE — Progress Notes (Signed)
Subjective:    Brandy Knight is being seen today for f/u obstetrical visit.  This is not a planned pregnancy. She is at 71.4 gestation by US performed on February 12,2015 . Her obstetrical history is significant for nothing. Denies Specifically abdominal pain, contractions, nausea, vomiting, edema, vaginal bleeding/discharge/loss of fluid, HA, blurred vision.   Menstrual History: OB History   Grav Para Term Preterm Abortions TAB SAB Ect Mult Living   1               Menarche age: 70  Patient's last menstrual period was 01/05/2013.     Review of Systems Pertinent items are noted in HPI.    Objective:    BP 110/61  Pulse 94  Temp(Src) 99.2 F (37.3 C)  Wt 165 lb (74.844 kg)  LMP 01/05/2013  Gen:  HEENT: [place]/AT Neck: normal thyroid Cardio: RRR Lung: CTA B Extremities: No edema B/L                      Assessment:    Pregnancy at 31.4 wks  Plan:     Due for 1 hr GTT, CBC, RPR, HIV today as well as Tdap Prenatal vitamins. Problem list reviewed and updated. Follow up in 2 weeks  POCT Urine Culture for TOC 2/2 UTI at last visit  Pregnancy Medical Home form completed.  PHQ 9 = 0 today.

## 2013-08-05 LAB — CBC WITH DIFFERENTIAL/PLATELET
BASOS ABS: 0 10*3/uL (ref 0.0–0.1)
Basophils Relative: 0 % (ref 0–1)
EOS ABS: 0.1 10*3/uL (ref 0.0–0.7)
EOS PCT: 1 % (ref 0–5)
HEMATOCRIT: 32.7 % — AB (ref 36.0–46.0)
Hemoglobin: 10.9 g/dL — ABNORMAL LOW (ref 12.0–15.0)
Lymphocytes Relative: 15 % (ref 12–46)
Lymphs Abs: 1.7 10*3/uL (ref 0.7–4.0)
MCH: 25.8 pg — AB (ref 26.0–34.0)
MCHC: 33.3 g/dL (ref 30.0–36.0)
MCV: 77.3 fL — ABNORMAL LOW (ref 78.0–100.0)
Monocytes Absolute: 0.8 10*3/uL (ref 0.1–1.0)
Monocytes Relative: 7 % (ref 3–12)
Neutro Abs: 8.9 10*3/uL — ABNORMAL HIGH (ref 1.7–7.7)
Neutrophils Relative %: 77 % (ref 43–77)
Platelets: 139 10*3/uL — ABNORMAL LOW (ref 150–400)
RBC: 4.23 MIL/uL (ref 3.87–5.11)
RDW: 14.7 % (ref 11.5–15.5)
WBC: 11.6 10*3/uL — ABNORMAL HIGH (ref 4.0–10.5)

## 2013-08-05 LAB — HIV ANTIBODY (ROUTINE TESTING W REFLEX): HIV 1&2 Ab, 4th Generation: NONREACTIVE

## 2013-08-05 LAB — RPR

## 2013-08-17 ENCOUNTER — Ambulatory Visit (INDEPENDENT_AMBULATORY_CARE_PROVIDER_SITE_OTHER): Payer: Medicaid Other | Admitting: Family Medicine

## 2013-08-17 VITALS — BP 107/55 | HR 66 | Temp 98.3°F | Wt 169.4 lb

## 2013-08-17 DIAGNOSIS — N390 Urinary tract infection, site not specified: Secondary | ICD-10-CM

## 2013-08-17 NOTE — Patient Instructions (Signed)
Second Trimester of Pregnancy The second trimester is from week 13 through week 28, months 4 through 6. The second trimester is often a time when you feel your best. Your body has also adjusted to being pregnant, and you begin to feel better physically. Usually, morning sickness has lessened or quit completely, you may have more energy, and you may have an increase in appetite. The second trimester is also a time when the fetus is growing rapidly. At the end of the sixth month, the fetus is about 9 inches long and weighs about 1 pounds. You will likely begin to feel the baby move (quickening) between 18 and 20 weeks of the pregnancy. BODY CHANGES Your body goes through many changes during pregnancy. The changes vary from woman to woman.   Your weight will continue to increase. You will notice your lower abdomen bulging out.  You may begin to get stretch marks on your hips, abdomen, and breasts.  You may develop headaches that can be relieved by medicines approved by your caregiver.  You may urinate more often because the fetus is pressing on your bladder.  You may develop or continue to have heartburn as a result of your pregnancy.  You may develop constipation because certain hormones are causing the muscles that push waste through your intestines to slow down.  You may develop hemorrhoids or swollen, bulging veins (varicose veins).  You may have back pain because of the weight gain and pregnancy hormones relaxing your joints between the bones in your pelvis and as a result of a shift in weight and the muscles that support your balance.  Your breasts will continue to grow and be tender.  Your gums may bleed and may be sensitive to brushing and flossing.  Dark spots or blotches (chloasma, mask of pregnancy) may develop on your face. This will likely fade after the baby is born.  A dark line from your belly button to the pubic area (linea nigra) may appear. This will likely fade after the  baby is born. WHAT TO EXPECT AT YOUR PRENATAL VISITS During a routine prenatal visit:  You will be weighed to make sure you and the fetus are growing normally.  Your blood pressure will be taken.  Your abdomen will be measured to track your baby's growth.  The fetal heartbeat will be listened to.  Any test results from the previous visit will be discussed. Your caregiver may ask you:  How you are feeling.  If you are feeling the baby move.  If you have had any abnormal symptoms, such as leaking fluid, bleeding, severe headaches, or abdominal cramping.  If you have any questions. Other tests that may be performed during your second trimester include:  Blood tests that check for:  Low iron levels (anemia).  Gestational diabetes (between 24 and 28 weeks).  Rh antibodies.  Urine tests to check for infections, diabetes, or protein in the urine.  An ultrasound to confirm the proper growth and development of the baby.  An amniocentesis to check for possible genetic problems.  Fetal screens for spina bifida and Down syndrome. HOME CARE INSTRUCTIONS   Avoid all smoking, herbs, alcohol, and unprescribed drugs. These chemicals affect the formation and growth of the baby.  Follow your caregiver's instructions regarding medicine use. There are medicines that are either safe or unsafe to take during pregnancy.  Exercise only as directed by your caregiver. Experiencing uterine cramps is a good sign to stop exercising.  Continue to eat regular,   healthy meals.  Wear a good support bra for breast tenderness.  Do not use hot tubs, steam rooms, or saunas.  Wear your seat belt at all times when driving.  Avoid raw meat, uncooked cheese, cat litter boxes, and soil used by cats. These carry germs that can cause birth defects in the baby.  Take your prenatal vitamins.  Try taking a stool softener (if your caregiver approves) if you develop constipation. Eat more high-fiber foods,  such as fresh vegetables or fruit and whole grains. Drink plenty of fluids to keep your urine clear or pale yellow.  Take warm sitz baths to soothe any pain or discomfort caused by hemorrhoids. Use hemorrhoid cream if your caregiver approves.  If you develop varicose veins, wear support hose. Elevate your feet for 15 minutes, 3 4 times a day. Limit salt in your diet.  Avoid heavy lifting, wear low heel shoes, and practice good posture.  Rest with your legs elevated if you have leg cramps or low back pain.  Visit your dentist if you have not gone yet during your pregnancy. Use a soft toothbrush to brush your teeth and be gentle when you floss.  A sexual relationship may be continued unless your caregiver directs you otherwise.  Continue to go to all your prenatal visits as directed by your caregiver. SEEK MEDICAL CARE IF:   You have dizziness.  You have mild pelvic cramps, pelvic pressure, or nagging pain in the abdominal area.  You have persistent nausea, vomiting, or diarrhea.  You have a bad smelling vaginal discharge.  You have pain with urination. SEEK IMMEDIATE MEDICAL CARE IF:   You have a fever.  You are leaking fluid from your vagina.  You have spotting or bleeding from your vagina.  You have severe abdominal cramping or pain.  You have rapid weight gain or loss.  You have shortness of breath with chest pain.  You notice sudden or extreme swelling of your face, hands, ankles, feet, or legs.  You have not felt your baby move in over an hour.  You have severe headaches that do not go away with medicine.  You have vision changes. Document Released: 03/20/2001 Document Revised: 11/26/2012 Document Reviewed: 05/27/2012 [employer] Patient Information 2014 [employer].  

## 2013-08-17 NOTE — Progress Notes (Signed)
Subjective:    Brandy Knight is being seen today for f/u obstetrical visit.   She is at 25 gestation by US performed on February 12,2015 . Her obstetrical history is significant for nothing. Denies Specifically abdominal pain, contractions, nausea, vomiting, edema, vaginal bleeding/discharge/loss of fluid, HA, blurred vision.   Menstrual History: OB History   Grav Para Term Preterm Abortions TAB SAB Ect Mult Living   1               Menarche age: 23  Patient's last menstrual period was 01/05/2013.     Review of Systems Pertinent items are noted in HPI.    Objective:    BP 107/55  Pulse 66  Temp(Src) 98.3 F (36.8 C)  Wt 169 lb 6.4 oz (76.839 kg)  LMP 01/05/2013  Gen:  HEENT: [place]/AT Neck: normal thyroid Cardio: RRR Lung: CTA B Extremities: No edema B/L                      Assessment:    Pregnancy at 25 wks  Plan:     Lab work reviewed, no evidence of gestational DM Prenatal vitamins. Problem list reviewed and updated. Follow up in 2 weeks  Needs POCT Ob Culture for TOC 2/2 UTI in February

## 2013-09-01 ENCOUNTER — Encounter: Payer: Medicaid Other | Admitting: Family Medicine

## 2013-09-09 ENCOUNTER — Inpatient Hospital Stay (HOSPITAL_COMMUNITY)
Admission: AD | Admit: 2013-09-09 | Discharge: 2013-09-09 | Disposition: A | Discharge status: Home / Self Care | Payer: Medicaid Other | Source: Ambulatory Visit | Attending: Obstetrics & Gynecology | Admitting: Obstetrics & Gynecology

## 2013-09-09 ENCOUNTER — Encounter (HOSPITAL_COMMUNITY): Payer: Self-pay | Admitting: *Deleted

## 2013-09-09 DIAGNOSIS — O9989 Other specified diseases and conditions complicating pregnancy, childbirth and the puerperium: Principal | ICD-10-CM

## 2013-09-09 DIAGNOSIS — R109 Unspecified abdominal pain: Secondary | ICD-10-CM | POA: Insufficient documentation

## 2013-09-09 DIAGNOSIS — O99891 Other specified diseases and conditions complicating pregnancy: Secondary | ICD-10-CM | POA: Insufficient documentation

## 2013-09-09 LAB — URINALYSIS, ROUTINE W REFLEX MICROSCOPIC
Bilirubin Urine: NEGATIVE
Glucose, UA: NEGATIVE mg/dL
Hgb urine dipstick: NEGATIVE
Ketones, ur: NEGATIVE mg/dL
NITRITE: POSITIVE — AB
PROTEIN: NEGATIVE mg/dL
Specific Gravity, Urine: 1.01 (ref 1.005–1.030)
Urobilinogen, UA: 0.2 mg/dL (ref 0.0–1.0)
pH: 6.5 (ref 5.0–8.0)

## 2013-09-09 LAB — URINE MICROSCOPIC-ADD ON

## 2013-09-09 MED ORDER — NITROFURANTOIN MONOHYD MACRO 100 MG PO CAPS: 100.0000 mg | ORAL_CAPSULE | Freq: Two times a day (BID) | ORAL | Status: DC

## 2013-09-09 NOTE — Discharge Instructions (Signed)
Braxton Hicks Contractions Pregnancy is commonly associated with contractions of the uterus throughout the pregnancy. Towards the end of pregnancy (32 to 34 weeks), these contractions [facility] Ishmael Holter) can develop more often and may become more forceful. This is not true labor because these contractions do not result in opening (dilatation) and thinning of the cervix. They are sometimes difficult to tell apart from true labor because these contractions can be forceful and people have different pain tolerances. You should not feel embarrassed if you go to the hospital with false labor. Sometimes, the only way to tell if you are in true labor is for your caregiver to follow the changes in the cervix. How to tell the difference between true and false labor:  False labor.  The contractions of false labor are usually shorter, irregular and not as hard as those of true labor.  They are often felt in the front of the lower abdomen and in the groin.  They may leave with walking around or changing positions while lying down.  They get weaker and are shorter lasting as time goes on.  These contractions are usually irregular.  They do not usually become progressively stronger, regular and closer together as with true labor.  True labor.  Contractions in true labor last 30 to 70 seconds, become very regular, usually become more intense, and increase in frequency.  They do not go away with walking.  The discomfort is usually felt in the top of the uterus and spreads to the lower abdomen and low back.  True labor can be determined by your caregiver with an exam. This will show that the cervix is dilating and getting thinner. If there are no prenatal problems or other health problems associated with the pregnancy, it is completely safe to be sent home with false labor and await the onset of true labor. HOME CARE INSTRUCTIONS   Keep up with your usual exercises and instructions.  Take medications as  directed.  Keep your regular prenatal appointment.  Eat and drink lightly if you think you are going into labor.  If BH contractions are making you uncomfortable:  Change your activity position from lying down or resting to walking/walking to resting.  Sit and rest in a tub of warm water.  Drink 2 to 3 glasses of water. Dehydration may cause B-H contractions.  Do slow and deep breathing several times an hour. SEEK IMMEDIATE MEDICAL CARE IF:   Your contractions continue to become stronger, more regular, and closer together.  You have a gushing, burst or leaking of fluid from the vagina.  An oral temperature above 102 F (38.9 C) develops.  You have passage of blood-tinged mucus.  You develop vaginal bleeding.  You develop continuous belly (abdominal) pain.  You have low back pain that you never had before.  You feel the baby's head pushing down causing pelvic pressure.  The baby is not moving as much as it used to. Document Released: 03/26/2005 Document Revised: 06/18/2011 Document Reviewed: 01/05/2013 [facility] Patient Information 2014 [place], [place]. Pregnancy and Urinary Tract Infection A urinary tract infection (UTI) is a bacterial infection of the urinary tract. Infection of the urinary tract can include the ureters, kidneys (pyelonephritis), bladder (cystitis), and urethra (urethritis). All pregnant women should be screened for bacteria in the urinary tract. Identifying and treating a UTI will decrease the risk of preterm labor and developing more serious infections in both the mother and baby. CAUSES Bacteria germs cause almost all UTIs.  RISK FACTORS Many  factors can increase your chances of getting a UTI during pregnancy. These include:  Having a short urethra.  Poor toilet and hygiene habits.  Sexual intercourse.  Blockage of urine along the urinary tract.  Problems with the pelvic muscles or nerves.  Diabetes.  Obesity.  Bladder problems after  having several children.  Previous history of UTI. SIGNS AND SYMPTOMS   Pain, burning, or a stinging feeling when urinating.  Suddenly feeling the need to urinate right away (urgency).  Loss of bladder control (urinary incontinence).  Frequent urination, more than is common with pregnancy.  Lower abdominal or back discomfort.  Cloudy urine.  Blood in the urine (hematuria).  Fever. When the kidneys are infected, the symptoms may be:  Back pain.  Flank pain on the right side more so than the left.  Fever.  Chills.  Nausea.  Vomiting. DIAGNOSIS  A urinary tract infection is usually diagnosed through urine tests. Additional tests and procedures are sometimes done. These may include:  Ultrasound exam of the kidneys, ureters, bladder, and urethra.  Looking in the bladder with a lighted tube (cystoscopy). TREATMENT Typically, UTIs can be treated with antibiotic medicines.  HOME CARE INSTRUCTIONS   Only take over-the-counter or prescription medicines as directed by your health care provider. If you were prescribed antibiotics, take them as directed. Finish them even if you start to feel better.  Drink enough fluids to keep your urine clear or pale yellow.  Do not have sexual intercourse until the infection is gone and your health care provider says it is okay.  Make sure you are tested for UTIs throughout your pregnancy. These infections often come back. Preventing a UTI in the Future  Practice good toilet habits. Always wipe from front to back. Use the tissue only once.  Do not hold your urine. Empty your bladder as soon as possible when the urge comes.  Do not douche or use deodorant sprays.  Wash with soap and warm water around the genital area and the anus.  Empty your bladder before and after sexual intercourse.  Wear underwear with a cotton crotch.  Avoid caffeine and carbonated drinks. They can irritate the bladder.  Drink cranberry juice or take  cranberry pills. This may decrease the risk of getting a UTI.  Do not drink alcohol.  Keep all your appointments and tests as scheduled. SEEK MEDICAL CARE IF:   Your symptoms get worse.  You are still having fevers 2 or more days after treatment begins.  You have a rash.  You feel that you are having problems with medicines prescribed.  You have abnormal vaginal discharge. SEEK IMMEDIATE MEDICAL CARE IF:   You have back or flank pain.  You have chills.  You have blood in your urine.  You have nausea and vomiting.  You have contractions of your uterus.  You have a gush of fluid from the vagina. MAKE SURE YOU:  Understand these instructions.   Will watch your condition.   Will get help right away if you are not doing well or get worse.  Document Released: 07/21/2010 Document Revised: 01/14/2013 Document Reviewed: 10/23/2012 [facility] Patient Information 2014 [place], [place].

## 2013-09-09 NOTE — MAU Note (Signed)
Mid left abdominal pain, intermittent, started at 3pm today. States sometimes feels like abdomen tightens with the pain. Denies vaginal bleeding/discharge or leaking. Positive fetal movement. Denies dysuria. Nauseated and vomited x 1 today.

## 2013-09-15 ENCOUNTER — Inpatient Hospital Stay (HOSPITAL_COMMUNITY)
Admission: AD | Admit: 2013-09-15 | Discharge: 2013-09-19 | DRG: 774 | Disposition: A | Discharge status: Home / Self Care | Payer: Medicaid Other | Source: Ambulatory Visit | Attending: Family Medicine | Admitting: Family Medicine

## 2013-09-15 DIAGNOSIS — Z833 Family history of diabetes mellitus: Secondary | ICD-10-CM

## 2013-09-15 DIAGNOSIS — O2390 Unspecified genitourinary tract infection in pregnancy, unspecified trimester: Secondary | ICD-10-CM | POA: Diagnosis present

## 2013-09-15 DIAGNOSIS — O864 Pyrexia of unknown origin following delivery: Secondary | ICD-10-CM | POA: Diagnosis not present

## 2013-09-15 DIAGNOSIS — N390 Urinary tract infection, site not specified: Secondary | ICD-10-CM | POA: Diagnosis present

## 2013-09-15 DIAGNOSIS — IMO0001 Reserved for inherently not codable concepts without codable children: Secondary | ICD-10-CM

## 2013-09-15 NOTE — MAU Note (Signed)
Reports pressure in the lower abdomen that started today. Is also having pain in the left abdomen, thinks its related to baby's position. Denies vaginal bleeding and LOF.

## 2013-09-16 ENCOUNTER — Encounter (HOSPITAL_COMMUNITY): Payer: Self-pay | Admitting: *Deleted

## 2013-09-16 DIAGNOSIS — IMO0001 Reserved for inherently not codable concepts without codable children: Secondary | ICD-10-CM

## 2013-09-16 DIAGNOSIS — O864 Pyrexia of unknown origin following delivery: Secondary | ICD-10-CM

## 2013-09-16 DIAGNOSIS — O2390 Unspecified genitourinary tract infection in pregnancy, unspecified trimester: Secondary | ICD-10-CM

## 2013-09-16 DIAGNOSIS — N390 Urinary tract infection, site not specified: Secondary | ICD-10-CM

## 2013-09-16 LAB — CBC
HEMATOCRIT: 33.3 % — AB (ref 36.0–46.0)
Hemoglobin: 11 g/dL — ABNORMAL LOW (ref 12.0–15.0)
MCH: 26.4 pg (ref 26.0–34.0)
MCHC: 33 g/dL (ref 30.0–36.0)
MCV: 79.9 fL (ref 78.0–100.0)
Platelets: 176 10*3/uL (ref 150–400)
RBC: 4.17 MIL/uL (ref 3.87–5.11)
RDW: 14.1 % (ref 11.5–15.5)
WBC: 22.3 10*3/uL — ABNORMAL HIGH (ref 4.0–10.5)

## 2013-09-16 LAB — RPR

## 2013-09-16 LAB — GROUP B STREP BY PCR: Group B strep by PCR: NEGATIVE

## 2013-09-16 LAB — OB RESULTS CONSOLE GBS: GBS: NEGATIVE

## 2013-09-16 MED ORDER — ONDANSETRON HCL 4 MG/2ML IJ SOLN
4.0000 mg | Freq: Four times a day (QID) | INTRAMUSCULAR | Status: DC | PRN
  Administered 2013-09-16: 4 mg via INTRAVENOUS
  Filled 2013-09-16: qty 2

## 2013-09-16 MED ORDER — FLEET ENEMA 7-19 GM/118ML RE ENEM: 1.0000 | ENEMA | RECTAL | Status: DC | PRN

## 2013-09-16 MED ORDER — DIPHENHYDRAMINE HCL 25 MG PO CAPS: 25.0000 mg | ORAL_CAPSULE | Freq: Four times a day (QID) | ORAL | Status: DC | PRN

## 2013-09-16 MED ORDER — CITRIC ACID-SODIUM CITRATE 334-500 MG/5ML PO SOLN: 30.0000 mL | ORAL | Status: DC | PRN

## 2013-09-16 MED ORDER — SENNOSIDES-DOCUSATE SODIUM 8.6-50 MG PO TABS
2.0000 | ORAL_TABLET | ORAL | Status: DC
  Administered 2013-09-17 – 2013-09-18 (×3): 2 via ORAL
  Filled 2013-09-16 (×3): qty 2

## 2013-09-16 MED ORDER — OXYTOCIN BOLUS FROM INFUSION: 500.0000 mL | INTRAVENOUS | Status: DC

## 2013-09-16 MED ORDER — PRENATAL MULTIVITAMIN CH
1.0000 | ORAL_TABLET | Freq: Every day | ORAL | Status: DC
  Administered 2013-09-17 – 2013-09-18 (×2): 1 via ORAL
  Filled 2013-09-16 (×2): qty 1

## 2013-09-16 MED ORDER — ONDANSETRON HCL 4 MG PO TABS: 4.0000 mg | ORAL_TABLET | ORAL | Status: DC | PRN

## 2013-09-16 MED ORDER — OXYTOCIN 10 UNIT/ML IJ SOLN
INTRAMUSCULAR | Status: AC
  Administered 2013-09-16: 10 [IU]
  Filled 2013-09-16: qty 1

## 2013-09-16 MED ORDER — OXYTOCIN 40 UNITS IN LACTATED RINGERS INFUSION - SIMPLE MED
62.5000 mL/h | INTRAVENOUS | Status: DC
  Filled 2013-09-16: qty 1000

## 2013-09-16 MED ORDER — BENZOCAINE-MENTHOL 20-0.5 % EX AERO
1.0000 "application " | INHALATION_SPRAY | CUTANEOUS | Status: DC | PRN
  Administered 2013-09-16: 1 via TOPICAL
  Filled 2013-09-16: qty 56

## 2013-09-16 MED ORDER — LACTATED RINGERS IV SOLN: 500.0000 mL | INTRAVENOUS | Status: DC | PRN

## 2013-09-16 MED ORDER — LANOLIN HYDROUS EX OINT: TOPICAL_OINTMENT | CUTANEOUS | Status: DC | PRN

## 2013-09-16 MED ORDER — IBUPROFEN 600 MG PO TABS
600.0000 mg | ORAL_TABLET | Freq: Four times a day (QID) | ORAL | Status: DC
  Administered 2013-09-16 – 2013-09-19 (×11): 600 mg via ORAL
  Filled 2013-09-16 (×11): qty 1

## 2013-09-16 MED ORDER — IBUPROFEN 600 MG PO TABS
600.0000 mg | ORAL_TABLET | Freq: Four times a day (QID) | ORAL | Status: DC | PRN
  Administered 2013-09-16: 600 mg via ORAL
  Filled 2013-09-16: qty 1

## 2013-09-16 MED ORDER — DIBUCAINE 1 % RE OINT: 1.0000 "application " | TOPICAL_OINTMENT | RECTAL | Status: DC | PRN

## 2013-09-16 MED ORDER — ACETAMINOPHEN 325 MG PO TABS: 650.0000 mg | ORAL_TABLET | ORAL | Status: DC | PRN

## 2013-09-16 MED ORDER — FENTANYL CITRATE 0.05 MG/ML IJ SOLN
100.0000 ug | INTRAMUSCULAR | Status: DC | PRN
  Administered 2013-09-16 (×2): 100 ug via INTRAVENOUS
  Filled 2013-09-16 (×2): qty 2

## 2013-09-16 MED ORDER — OXYCODONE-ACETAMINOPHEN 5-325 MG PO TABS: 1.0000 | ORAL_TABLET | ORAL | Status: DC | PRN

## 2013-09-16 MED ORDER — LACTATED RINGERS IV SOLN
INTRAVENOUS | Status: DC
  Administered 2013-09-16: 03:00:00 via INTRAVENOUS

## 2013-09-16 MED ORDER — ZOLPIDEM TARTRATE 5 MG PO TABS: 5.0000 mg | ORAL_TABLET | Freq: Every evening | ORAL | Status: DC | PRN

## 2013-09-16 MED ORDER — SIMETHICONE 80 MG PO CHEW: 80.0000 mg | CHEWABLE_TABLET | ORAL | Status: DC | PRN

## 2013-09-16 MED ORDER — WITCH HAZEL-GLYCERIN EX PADS: 1.0000 "application " | MEDICATED_PAD | CUTANEOUS | Status: DC | PRN

## 2013-09-16 MED ORDER — ONDANSETRON HCL 4 MG/2ML IJ SOLN: 4.0000 mg | INTRAMUSCULAR | Status: DC | PRN

## 2013-09-16 MED ORDER — TETANUS-DIPHTH-ACELL PERTUSSIS 5-2.5-18.5 LF-MCG/0.5 IM SUSP: 0.5000 mL | Freq: Once | INTRAMUSCULAR | Status: DC

## 2013-09-16 MED ORDER — LIDOCAINE HCL (PF) 1 % IJ SOLN
30.0000 mL | INTRAMUSCULAR | Status: AC | PRN
  Administered 2013-09-16: 30 mL via SUBCUTANEOUS
  Filled 2013-09-16: qty 30

## 2013-09-16 NOTE — H&P (Signed)
LABOR ADMISSION HISTORY AND PHYSICAL  Brandy Knight is a 23 y.o. female G75P0 with IUP at [redacted]w[redacted]d presenting for active labor.  She is feeling increased pressure today. The pressure has been getting more intense throughout the day. Her pregnancy has been uncomplicated. Dates confirmed by [place] on February 12,2015.     She denies any LOF or VB.   [facility] 16 wks  Prenatal History/Complications:  Past Medical History: Past Medical History  Diagnosis Date  . Medical history non-contributory     Past Surgical History: Past Surgical History  Procedure Laterality Date  . Wisdom tooth extraction    . No past surgeries      Obstetrical History: OB History   Grav Para Term Preterm Abortions TAB SAB Ect Mult Living   1               Social History: History   Social History  . Marital Status: Single    Spouse Name: N/A    Number of Children: N/A  . Years of Education: N/A   Social History Main Topics  . Smoking status: Never Smoker   . Smokeless tobacco: Never Used  . Alcohol Use: No  . Drug Use: No  . Sexual Activity: Yes   Other Topics Concern  . None   Social History Narrative  . None    Family History: Family History  Problem Relation Age of Onset  . Diabetes Mother   . Diabetes Father     Allergies: No Known Allergies  Prescriptions prior to admission  Medication Sig Dispense Refill  . nitrofurantoin, macrocrystal-monohydrate, (MACROBID) 100 MG capsule Take 1 capsule (100 mg total) by mouth 2 (two) times daily.  14 capsule  0  . Prenatal Vit-Fe Fumarate-FA (MULTIVITAMIN-PRENATAL) 27-0.8 MG TABS tablet Take 1 tablet by mouth daily at 12 noon.         Review of Systems   All systems reviewed and negative except as stated in HPI  Blood pressure 113/59, pulse 84, temperature 97.9 F (36.6 C), temperature source Oral, resp. rate 18, height 5\' 2"  (1.575 m), weight 79.096 kg (174 lb 6 oz), last menstrual period 01/05/2013, SpO2 100.00%. General  appearance: alert, cooperative and no distress Abdomen: soft, non-tender; bowel sounds normal Extremities: Homans sign is negative, no sign of DVT Presentation: cephalic Fetal monitoringBaseline: 135 bpm, Variability: Good {> 6 bpm), Accelerations: Reactive and Decelerations: Absent Uterine activityFrequency: Every 3-4 minutes Dilation: 7 Effacement (%): 80 Station: -2 Exam by:: Dr Langston Masker   Prenatal labs: ABO, Rh: AB/POS/-- (02/03 1010) Antibody: NEG (02/03 1010) Rubella:   RPR: NON REAC (04/28 1451)  HBsAg: NEGATIVE (02/03 1010)  HIV: NONREACTIVE (04/28 1451)  GBS:    1 hr Glucola 103 Genetic screening  Too late  Anatomy US normal   Prenatal Transfer Tool  Maternal Diabetes: No Genetic Screening: not performed  Maternal Ultrasounds/Referrals: Normal Fetal Ultrasounds or other Referrals:  None Maternal Substance Abuse:  No Significant Maternal Medications:  None Significant Maternal Lab Results: GBS unknown, GBS PCR: pending   Office  [facility] [place] [address] [place], [phone], Pager [phone]   Genetic Screen  Too Late   Anatomic [place]    Glucose Screen    GC / Chlamydia  Negative   GBS    Feeding Preference    Contraception    Peds    Circumcision        Results for orders placed during the hospital encounter of 09/15/13 (from the past  24 hour(s))  CBC   Collection Time    09/16/13  1:48 AM      Result Value Ref Range   WBC 22.3 (*) 4.0 - 10.5 K/uL   RBC 4.17  3.87 - 5.11 MIL/uL   Hemoglobin 11.0 (*) 12.0 - 15.0 g/dL   HCT 33.3 (*) 36.0 - 46.0 %   MCV 79.9  78.0 - 100.0 fL   MCH 26.4  26.0 - 34.0 pg   MCHC 33.0  30.0 - 36.0 g/dL   RDW 14.1  11.5 - 15.5 %   Platelets 176  150 - 400 K/uL    Assessment: Brandy Knight is a 23 y.o. G1P0 at [redacted]w[redacted]d here for active labor   #Labor: progressing spontaneously  #Pain: Fentanyl PRN, Epidural upon maternal request.  #FWB: Cat 1  #ID:  GBS unknown. GBS pcr: pending  #MOF: Bottle  #MOC:  depo #Circ:  Female, outpatient   [name] 09/16/2013, 3:23 AM  I have seen this patient and agree with the above resident's note.  [place], [place] Certified Nurse-Midwife

## 2013-09-16 NOTE — Progress Notes (Signed)
Brandy Knight is a 23 y.o. G1P0 at [redacted]w[redacted]d admitted for active labor  Subjective: Feeling more pressure and pain.   Objective: BP 108/66  Pulse 88  Temp(Src) 97.9 F (36.6 C) (Oral)  Resp 18  Ht 5\' 2"  (1.575 m)  Wt 79.096 kg (174 lb 6 oz)  BMI 31.89 kg/m2  SpO2 100%  LMP 01/05/2013      FHT:  FHR: 130 bpm, variability: moderate,  accelerations:  Present,  decelerations:  Absent UC:   irregular, every 2-4 minutes SVE:   Dilation: 8.5 Effacement (%): 100 Station: 0;-1 Exam by:: Dr. Raeford Razor   Labs: Lab Results  Component Value Date   WBC 22.3* 09/16/2013   HGB 11.0* 09/16/2013   HCT 33.3* 09/16/2013   MCV 79.9 09/16/2013   PLT 176 09/16/2013    Assessment / Plan: Spontaneous labor, progressing normally  Labor: SROM, progressing spontaneously  Fetal Wellbeing:  Category I Pain Control:  Fentanyl PRN I/D:  GBS pcr pending Anticipated MOD:  NSVD  [name] 09/16/2013, 5:01 AM

## 2013-09-16 NOTE — Progress Notes (Signed)
Ur chart review completed.  

## 2013-09-17 LAB — CBC
HEMATOCRIT: 32.4 % — AB (ref 36.0–46.0)
HEMOGLOBIN: 10.9 g/dL — AB (ref 12.0–15.0)
MCH: 26.5 pg (ref 26.0–34.0)
MCHC: 33.6 g/dL (ref 30.0–36.0)
MCV: 78.8 fL (ref 78.0–100.0)
Platelets: 165 10*3/uL (ref 150–400)
RBC: 4.11 MIL/uL (ref 3.87–5.11)
RDW: 13.9 % (ref 11.5–15.5)
WBC: 22.3 10*3/uL — ABNORMAL HIGH (ref 4.0–10.5)

## 2013-09-17 MED ORDER — PIPERACILLIN-TAZOBACTAM 3.375 G IVPB
3.3750 g | Freq: Three times a day (TID) | INTRAVENOUS | Status: DC
  Administered 2013-09-17 – 2013-09-19 (×5): 3.375 g via INTRAVENOUS
  Filled 2013-09-17 (×6): qty 50

## 2013-09-17 NOTE — Clinical Social Work Maternal (Signed)
    Clinical Social Work Department PSYCHOSOCIAL ASSESSMENT - MATERNAL/CHILD 09/17/2013  Patient:  Brandy Knight, Brandy Knight  Account Number:  [id number]  Admit Date:  09/15/2013  Ardine Eng Name:   Dicie Beam    Clinical Social Worker:  Gerri Spore, LCSW   Date/Time:  09/17/2013 04:12 PM  Date Referred:  09/17/2013   Referral source  CN     Referred reason  Other - See comment   Other referral source:    I:  FAMILY / Vivian legal guardian:  PARENT  Guardian - Name Guardian - Age Guardian - Address  Shequita Peplinski 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:  Medicaid If [place]:  [place] Other  [place] / Grade:   Maternity Care Coordinator / Child Services Coordination / Early Interventions:   Yes  Cultural issues impacting care:    III  STRENGTHS Strengths  Adequate Resources  Home prepared for Child (including basic supplies)  Supportive family/friends   Strength comment:    IV  RISK FACTORS AND CURRENT PROBLEMS Current Problem:  YES   Risk Factor & Current Problem Patient Issue Family Issue Risk Factor / Current Problem Comment  Other - See comment Y N Limited PNC (3 visits)    V  SOCIAL WORK ASSESSMENT CSW referral received to assess pt's reason for limited PNC.  Pt works at [employer] was not allowed any time off work to attend appointments.  She denies any illegal substance use & verbalized an understanding of hospital drug testing policy.  UDS is negative, meconium results are pending.  Pt identified her grandmother as her primary support person.  FOB is incarcerated for drug charges, per pt.  She has all the necessary supplies for the infant & appears to  be bonding well.  CSW will continue to monitor drug screen results & make a referral.      VI SOCIAL WORK PLAN Social Work Plan  No Further Intervention Required / No Barriers to Discharge   Type of pt/family education:   If child protective services report - county:   If child protective services report - date:   Information/referral to community resources comment:   Other social work plan:

## 2013-09-17 NOTE — Progress Notes (Signed)
Post Partum Day 1 Subjective: no complaints, up ad lib, voiding, tolerating PO and + flatus  Objective: Blood pressure 89/57, pulse 59, temperature 97.9 F (36.6 C), temperature source Oral, resp. rate 20, height 5\' 2"  (1.575 m), weight 174 lb 6 oz (79.096 kg), last menstrual period 01/05/2013, SpO2 99.00%, unknown if currently breastfeeding.  Physical Exam:  General: alert, cooperative and appears stated age Lochia: appropriate Uterine Fundus: firm DVT Evaluation: No evidence of DVT seen on physical exam.   Recent Labs  09/16/13 0148 09/17/13 0550  HGB 11.0* 10.9*  HCT 33.3* 32.4*    Assessment/Plan: Plan for discharge tomorrow   LOS: 2 days   Kennith Maes 09/17/2013, 8:19 AM   I have seen and examined this patient and agree with above documentation in the resident's note.   Ebbie Latus, M.D. [facility] Fellow 09/17/2013 8:56 AM

## 2013-09-17 NOTE — Progress Notes (Signed)
Pt has had 2 fevers, 101.3-103.  Blood and urine cx pending.  Placed on zosyn per Dr. Gala Romney.  Examined by Dr. Olevia Bowens and does not appear ill.

## 2013-09-17 NOTE — Discharge Summary (Signed)
Obstetric Discharge Summary Reason for Admission: onset of labor Prenatal Procedures: none Intrapartum Procedures: spontaneous vaginal delivery Postpartum Procedures: none Complications-Operative and Postpartum: 2nd degree perineal lac, postpartum fevers, UTI  CBC Latest Ref Rng 09/17/2013 09/16/2013 08/04/2013  WBC 4.0 - 10.5 K/uL 22.3(H) 22.3(H) 11.6(H)  Hemoglobin 12.0 - 15.0 g/dL 10.9(L) 11.0(L) 10.9(L)  Hematocrit 36.0 - 46.0 % 32.4(L) 33.3(L) 32.7(L)  Platelets 150 - 400 K/uL 165 176 139(L)    Results for orders placed during the hospital encounter of 09/15/13 (from the past 48 hour(s))  CULTURE, BLOOD (ROUTINE X 2)     Status: None   Collection Time    09/17/13 12:40 PM      Result Value Ref Range   Specimen Description BLOOD RIGHT ARM     Special Requests       Value: BOTTLES DRAWN AEROBIC AND ANAEROBIC 5CC EACH BOTTLE   Culture  Setup Time       Value: 09/17/2013 16:21     Performed at [facility]   Culture       Value:        BLOOD CULTURE RECEIVED NO GROWTH TO DATE CULTURE WILL BE HELD FOR 5 DAYS BEFORE ISSUING A FINAL NEGATIVE REPORT     Performed at [facility]   Report Status PENDING    CULTURE, BLOOD (ROUTINE X 2)     Status: None   Collection Time    09/17/13 12:50 PM      Result Value Ref Range   Specimen Description BLOOD LEFT ARM     Special Requests BOTTLES DRAWN AEROBIC ONLY 3CC EACH BOTTLE     Culture  Setup Time       Value: 09/17/2013 16:21     Performed at [facility]   Culture       Value:        BLOOD CULTURE RECEIVED NO GROWTH TO DATE CULTURE WILL BE HELD FOR 5 DAYS BEFORE ISSUING A FINAL NEGATIVE REPORT     Performed at [facility]   Report Status PENDING    URINE CULTURE     Status: None   Collection Time    09/17/13  2:35 PM      Result Value Ref Range   Specimen Description URINE, CLEAN CATCH     Special Requests NONE     Culture  Setup Time       Value: 09/17/2013 18:55     Performed at [facility]       Value: >=100,000 COLONIES/ML     Performed at [facility]   Culture       Value: ESCHERICHIA COLI     Performed at [facility]   Report Status PENDING      Delivery Note At 7:15 AM a viable female was delivered via Vaginal, Spontaneous Delivery (Presentation: Left Occiput Anterior).  APGAR: 9, 9; weight pending.   Placenta status: Intact, Spontaneous.  Cord: 3 vessels with the following complications: None.  Cord pH: N/A Anesthesia: None  Episiotomy: None Lacerations: 2nd degree;Perineal Suture Repair: 3.0 vicryl Est. Blood Loss (mL): 300 ml  Mom to postpartum.  Baby to Couplet care / Skin to Skin. Brandy Knight 09/16/2013, 7:55 AM I was present for this delivery and agree with the above resident's note. Knight, Brandy Certified Nurse-Midwife  Physical Exam:  Temp:  [97.7 F (36.5 C)-98.8 F (37.1 C)] 97.7 F (36.5 C) (06/13 0530) Pulse Rate:  [73-97]  73 (06/13 0530) Resp:  [18] 18 (06/13 0530) BP: (96-108)/(59-62) 96/62 mmHg (06/13 0530) General: alert, cooperative and appears stated age Lochia: appropriate Abd: No tenderness on exam Uterine Fundus: firm DVT Evaluation: No evidence of DVT seen on physical exam.  Brief Hospital Course: 23 y.o. female G1P1 PPD#3 s/p SVD, after presenting for active labor at [redacted]w[redacted]d.  Uncomplicated delivery and was initially stable. However, she developed postpartum fevers and abdominal pain; Tm 103 at 2330 on 09/17/13. She was started on Zosyn and this helped alleviate her symptoms.  Labs showed leukocytosis, and cultures showed 100K E.coli in urine, blood culture results pending at time of discharge.  She remained afebrile for over 24 hours.  She was discharged on Levofloxacin and Metronidazole x 10 days, will follow up cultures.  She will be seen in clinic on 09/21/13.   Discharge Diagnoses: Term Pregnancy-delivered, postpartum fevers, UTI  Discharge Information: Date: 09/17/2013 Activity:  pelvic rest Diet: routine Medications: Ibuprofen, Percocet, Levofloxacin, Metronidazole Condition: stable Instructions: refer to practice specific booklet Discharge to: home  Newborn Data: Live born female  Birth Weight: 6 lb 7.9 oz (2945 g) APGAR: 9, 9  Home with mother.  She plans on bottle feed, would like Depo for [facility], and baby to be circumcised in outpatient clinic.   Will follow up with Dr. Awanda Mink on 09/21/13 at [place], MD, [place] Attending [place], [facility]

## 2013-09-17 NOTE — H&P (Signed)
Attestation of Attending Supervision of Advanced Practitioner (PA/CNM/NP): Evaluation and management procedures were performed by the Advanced Practitioner under my supervision and collaboration.  I have reviewed the Advanced Practitioner's note and chart, and I agree with the management and plan.  Donnamae Jude, MD [facility] Attending 09/17/2013 11:07 AM

## 2013-09-18 ENCOUNTER — Ambulatory Visit: Payer: Medicaid Other

## 2013-09-18 MED ORDER — MEPERIDINE HCL 25 MG/ML IJ SOLN: 6.2500 mg | INTRAMUSCULAR | Status: AC

## 2013-09-18 NOTE — Progress Notes (Signed)
Post Partum Day 2 Subjective: up ad lib, voiding, tolerating PO and c/o rigors and chills overnight  Objective: Blood pressure 96/48, pulse 82, temperature 99.1 F (37.3 C), temperature source Oral, resp. rate 18, height 5\' 2"  (1.575 m), weight 174 lb 6 oz (79.096 kg), last menstrual period 01/05/2013, SpO2 100.00%, unknown if currently breastfeeding.  **temperature has been taken after eating ice.  RN in room when I asked pt about last oral intake and she said she just ate ice prior to the temperature being taken this morning.  Physical Exam:  General: alert, cooperative and no distress; Pt is shivering at time of exam Lochia: appropriate Uterine Fundus: firm Incision: n/a DVT Evaluation: No evidence of DVT seen on physical exam. Negative Homan's sign.   Recent Labs  09/16/13 0148 09/17/13 0550  HGB 11.0* 10.9*  HCT 33.3* 32.4*    Assessment/Plan: 23 yo black female s/p NSVD with pp fevers 1-Continue Zosyn 2-Demerol for rigors 3-Follow culture   LOS: 3 days   Shaquavia Whisonant H. 09/18/2013, 6:45 AM

## 2013-09-19 LAB — URINE CULTURE: Colony Count: >=100000

## 2013-09-19 MED ORDER — METRONIDAZOLE 500 MG PO TABS
500.0000 mg | ORAL_TABLET | Freq: Two times a day (BID) | ORAL | Status: AC
Start: 2013-09-19 — End: 2013-09-26

## 2013-09-19 MED ORDER — OXYCODONE-ACETAMINOPHEN 5-325 MG PO TABS: 1.0000 | ORAL_TABLET | ORAL | Status: DC | PRN

## 2013-09-19 MED ORDER — IBUPROFEN 600 MG PO TABS: 600.0000 mg | ORAL_TABLET | Freq: Four times a day (QID) | ORAL | Status: DC

## 2013-09-19 MED ORDER — LEVOFLOXACIN 500 MG PO TABS: 500.0000 mg | ORAL_TABLET | Freq: Every day | ORAL | Status: DC

## 2013-09-23 LAB — CULTURE, BLOOD (ROUTINE X 2)
CULTURE: NO GROWTH
Culture: NO GROWTH

## 2013-10-06 ENCOUNTER — Encounter: Payer: Self-pay | Admitting: Family Medicine

## 2013-10-06 ENCOUNTER — Telehealth: Payer: Self-pay | Admitting: *Deleted

## 2013-10-06 NOTE — Telephone Encounter (Signed)
Left message on patient voicemail for a return call,form was completed by Dr Gerlean Ren replacing Dr Waldemar Dickens would need to sign medical release form before we could  Fax for privacy/HIPPA reason.will wait until return call before placing upfront.[place], Brandy Knight

## 2013-10-06 NOTE — Progress Notes (Signed)
Filled out form.  Please have patient pick up at front desk and sign.  Thanks!

## 2013-10-06 NOTE — Progress Notes (Unsigned)
Patient dropped off work form to be filled out.  Please fax when completed.

## 2013-10-07 NOTE — Telephone Encounter (Signed)
Pt return phone call from [place], [place].  Pt informed she will need to come into office and sign release of information before information will be faxed.  Pt stated understanding.  Derl Barrow, RN

## 2013-10-22 ENCOUNTER — Ambulatory Visit: Payer: Medicaid Other | Admitting: Family Medicine

## 2013-11-06 ENCOUNTER — Encounter: Payer: Self-pay | Admitting: Family Medicine

## 2013-11-06 ENCOUNTER — Ambulatory Visit (INDEPENDENT_AMBULATORY_CARE_PROVIDER_SITE_OTHER): Payer: Medicaid Other | Admitting: Family Medicine

## 2013-11-06 NOTE — Progress Notes (Signed)
Brandy Knight is a 23 y.o. female who presents today for post partum follow up.  Since delivery, pt has been doing well and has no complaints.  She is doing well with the baby and is ready to go back to work.  Denies any breast pain, vaginal pain, or abdominal pain.  Has started doing activity w/o complaints.   Past Medical History  Diagnosis Date  . Medical history non-contributory     History  Smoking status  . Never Smoker   Smokeless tobacco  . Never Used    Family History  Problem Relation Age of Onset  . Diabetes Mother   . Diabetes Father     Current Outpatient Prescriptions on File Prior to Visit  Medication Sig Dispense Refill  . ibuprofen (ADVIL,MOTRIN) 600 MG tablet Take 1 tablet (600 mg total) by mouth every 6 (six) hours.  60 tablet  2  . levofloxacin (LEVAQUIN) 500 MG tablet Take 1 tablet (500 mg total) by mouth daily.  10 tablet  0  . oxyCODONE-acetaminophen (PERCOCET/ROXICET) 5-325 MG per tablet Take 1-2 tablets by mouth every 4 (four) hours as needed for severe pain (moderate - severe pain).  30 tablet  0   No current facility-administered medications on file prior to visit.    ROS: Per HPI.  All other systems reviewed and are negative.   Physical Exam Filed Vitals:   11/06/13 0902  BP: 109/68  Pulse: 59  Temp: 98.2 F (36.8 C)    Physical Examination: General appearance - alert, well appearing, and in no distress Neck - supple, no significant adenopathy Chest - clear to auscultation, no wheezes, rales or rhonchi, symmetric air entry Heart - normal rate and regular rhythm Abdomen - soft, nontender, nondistended, no masses or organomegaly   Lab Results  Component Value Date   WBC 22.3* 09/17/2013   HGB 10.9* 09/17/2013   HCT 32.4* 09/17/2013   MCV 78.8 09/17/2013   PLT 165 09/17/2013

## 2013-11-06 NOTE — Patient Instructions (Signed)
Ms [place], it was nice seeing you today.  Please call if you need anything.    Thank you, Dr. Awanda Mink

## 2013-11-06 NOTE — Assessment & Plan Note (Signed)
Pt doing well at visit today, has no complaints.  She had a 0 on her [place] and PHQ 9 today and shows no S/Sx of depression.  For Birth control, she is continuing to use depo and will get in another 6 weeks.  Denies any weight gain, headache, or breath through bleeding.

## 2014-02-08 ENCOUNTER — Encounter: Payer: Self-pay | Admitting: Family Medicine

## 2014-05-21 ENCOUNTER — Ambulatory Visit: Payer: Medicaid Other | Admitting: Family Medicine

## 2014-09-16 ENCOUNTER — Encounter: Payer: Self-pay | Admitting: Family Medicine

## 2014-09-16 ENCOUNTER — Ambulatory Visit (INDEPENDENT_AMBULATORY_CARE_PROVIDER_SITE_OTHER): Payer: Self-pay | Admitting: Family Medicine

## 2014-09-16 VITALS — BP 106/54 | HR 62 | Temp 97.7°F | Ht 62.0 in | Wt 180.4 lb

## 2014-09-16 DIAGNOSIS — L03113 Cellulitis of right upper limb: Secondary | ICD-10-CM

## 2014-09-16 MED ORDER — DOXYCYCLINE HYCLATE 100 MG PO TABS: 100.0000 mg | ORAL_TABLET | Freq: Two times a day (BID) | ORAL | Status: DC

## 2014-09-16 NOTE — Progress Notes (Signed)
I was preceptor the day of this visit.   

## 2014-09-16 NOTE — Patient Instructions (Signed)
Thank you for coming in, today!  I think you have a skin infection called cellulitis. You probably did have a bug bite of some type.  I want you to take an antibiotic called doxycycline for 7 days. Take one pill twice a day with food. This medicine can sensitize you to sunlight. If you go out in the sun while taking this medicine, make sure you use sunscreen or wear long-sleeved shirts / pants. You can take cetirizine (Zytec) or loratadine (Claritin) or similar antihistamine medicines to help with the itching.  Come back to see Dr. Lacinda Axon on Monday at 4 PM. If you feel like things are doing much better by Sunday, call Monday morning and cancel the appoitnment. If you get worse between now and then, come back sooner or go to the emergency room.  Please feel free to call with any questions or concerns at any time, at [phone]. --Dr. Venetia Maxon

## 2014-09-16 NOTE — Progress Notes (Signed)
   Subjective:    Patient ID: Brandy Knight, female    DOB: 1991-02-10, 24 y.o.   MRN: [id number]  HPI: Pt presents to clinic for SDA for concern for bug / spider bite. She noticed it about 2 days ago in the morning when she woke up. She had a small bump on her right forearm and put alcohol on it. Through the day that and yesterday, the area around the bite got red, slightly tender, and slightly swollen with some itching. The area has gotten somewater larger through the past 2 days. The area is warm to the touch and has gotten more itchy but is not painful. She has no broken skin, bleeding, or draining. She has no systemic symptoms (specifically denies fever, chills, N/V, abdominal pain, other arm swelling or other rashes). She has not taken any medications. The area is larger, as above, but she otherwise doesn't think it is frankly getting worse in terms of how she feels, otherwise.  She reports no sick contacts and has no recent outdoor activity or pet exposure where she could have been exposed to ticks. She did not notice any definite bug or spider on her arm when she noticed the bite or before.  Review of Systems: As above.     Objective:   Physical Exam BP 106/54 mmHg  Pulse 62  Temp(Src) 97.7 F (36.5 C) (Oral)  Ht 5\' 2"  (1.575 m)  Wt 180 lb 6.4 oz (81.829 kg)  BMI 32.99 kg/m2  LMP 09/10/2014 Gen: well-appearing young adult female in NAD HEENT: [place]/AT, EOMI, PERRLA, MMM Cardio: RRR, no murmur appreciated Pulm: CTAB, no wheezes, normal WOB Abd: soft, nontender, BS+ Ext: warm, well-perfused, no LE edema Skin:  ~0.5 cm welt-like lesion on right flexor forearm, nontender, without obvious break in skin  ~2 cm surrounding mild induration immediately around welt-like lesion  Erythema surrounding welt in ovoid pattern another ~2cm past extent of induration (total ~8 cm diameter)  No broken skin, bleeding, or drainage noted  No areas of frank fluctuance noted  No other rashes       Assessment & Plan:  24yo female with likely forearm cellulitis possibly secondary to insect or spider bite, without systemic symptoms and no abscess amenable to drainage - Rx for doxycycline 100 mg BID for 7 days - recommended OTC antihistamine for itching - counseled on photosensitivity with doxycycline and recommended occlusive clothing or use of sunscreen while taking abx - pt works at [employer]; advised covering area of skin while at work to prevent burns or further damage - scheduled f/u with Dr. Lacinda Axon on Monday 6/13 for recheck; advised pt she can cancel this appointment Monday morning if the lesion is cleared but advised completion of abx regardless - counseled at length on signs / symptoms of worsening infection (development of pain, worse redness / induration / etc, or development of systemic symptoms) - advised close f/u tomorrow or presentation to the ED over the weekend if any frank worsening despite abx - f/u with PCP Dr. Gerlean Ren, otherwise  Note FYI to Drs. [place] and [place].  Emmaline Kluver, MD PGY-3, [facility] 09/16/2014, 10:28 AM

## 2014-09-20 ENCOUNTER — Ambulatory Visit: Payer: Medicaid Other | Admitting: Family Medicine

## 2016-07-03 ENCOUNTER — Inpatient Hospital Stay (HOSPITAL_COMMUNITY)
Admission: AD | Admit: 2016-07-03 | Discharge: 2016-07-03 | Disposition: A | Discharge status: Home / Self Care | Payer: Medicaid Other | Source: Ambulatory Visit | Attending: Family Medicine | Admitting: Family Medicine

## 2016-07-03 ENCOUNTER — Encounter (HOSPITAL_COMMUNITY): Payer: Self-pay | Admitting: *Deleted

## 2016-07-03 DIAGNOSIS — J028 Acute pharyngitis due to other specified organisms: Secondary | ICD-10-CM

## 2016-07-03 DIAGNOSIS — J069 Acute upper respiratory infection, unspecified: Secondary | ICD-10-CM

## 2016-07-03 DIAGNOSIS — Z79899 Other long term (current) drug therapy: Secondary | ICD-10-CM | POA: Insufficient documentation

## 2016-07-03 DIAGNOSIS — N912 Amenorrhea, unspecified: Secondary | ICD-10-CM | POA: Insufficient documentation

## 2016-07-03 DIAGNOSIS — B348 Other viral infections of unspecified site: Secondary | ICD-10-CM | POA: Insufficient documentation

## 2016-07-03 DIAGNOSIS — J029 Acute pharyngitis, unspecified: Secondary | ICD-10-CM | POA: Insufficient documentation

## 2016-07-03 DIAGNOSIS — B9789 Other viral agents as the cause of diseases classified elsewhere: Secondary | ICD-10-CM

## 2016-07-03 LAB — RAPID STREP SCREEN (MED CTR MEBANE ONLY): Streptococcus, Group A Screen (Direct): NEGATIVE

## 2016-07-03 NOTE — MAU Note (Signed)
PT  SAYS HER THROAT  HURTS -    HURTS  TO SWALLOW-    STARTED  Sunday HAS BEEN USING  OTC   COUGH   MED- NOT  WORKING.   NO FEVER.   LAST SEX-  YESTERDAY- NO BIRTH  CONTROL-     NO HPT

## 2016-07-03 NOTE — MAU Provider Note (Signed)
History     CSN: [id number]  Arrival date and time: 07/03/16 1935   None     Chief Complaint  Patient presents with  . Sore Throat   26 y.o. Female here with sore throat x3 days. Endorses productive cough since yesterday. Mucous is thin and white. No fevers or chills. No SOB. No sick contacts. She also reports no menses since January. No VB or pain. She has not taken a HPT.     Past Medical History:  Diagnosis Date  . Medical history non-contributory     Past Surgical History:  Procedure Laterality Date  . NO PAST SURGERIES    . WISDOM TOOTH EXTRACTION      Family History  Problem Relation Age of Onset  . Diabetes Mother   . Diabetes Father     Social History  Substance Use Topics  . Smoking status: Never Smoker  . Smokeless tobacco: Never Used  . Alcohol use No    Allergies: No Known Allergies  Prescriptions Prior to Admission  Medication Sig Dispense Refill Last Dose  . doxycycline (VIBRA-TABS) 100 MG tablet Take 1 tablet (100 mg total) by mouth 2 (two) times daily. 14 tablet 0   . ibuprofen (ADVIL,MOTRIN) 600 MG tablet Take 1 tablet (600 mg total) by mouth every 6 (six) hours. 60 tablet 2   . oxyCODONE-acetaminophen (PERCOCET/ROXICET) 5-325 MG per tablet Take 1-2 tablets by mouth every 4 (four) hours as needed for severe pain (moderate - severe pain). 30 tablet 0     Review of Systems  HENT: Positive for sore throat. Negative for congestion.   Respiratory: Positive for cough. Negative for shortness of breath.   Gastrointestinal: Negative for abdominal pain.  Genitourinary: Negative for pelvic pain and vaginal bleeding.   Physical Exam   Blood pressure 112/68, pulse 94, temperature 98.2 F (36.8 C), temperature source Oral, resp. rate 20, height 5\' 2"  (1.575 m), weight 84 kg (185 lb 4 oz), last menstrual period 04/30/2016, unknown if currently breastfeeding.  Physical Exam  Nursing note and vitals reviewed. Constitutional: She is oriented to person,  place, and time. She appears well-developed and well-nourished. No distress.  HENT:  Head: Normocephalic and atraumatic.  Nose: Nose normal.  Mouth/Throat: Posterior oropharyngeal erythema (mild) present. No oropharyngeal exudate, posterior oropharyngeal edema or tonsillar abscesses.  Neck: Normal range of motion.  Cardiovascular: Normal rate, regular rhythm and normal heart sounds.   Respiratory: Effort normal and breath sounds normal. No respiratory distress. She has no wheezes.  Musculoskeletal: Normal range of motion.  Neurological: She is alert and oriented to person, place, and time.  Skin: Skin is warm and dry.  Psychiatric: She has a normal mood and affect.    MAU Course  Procedures  MDM Without concerning sx routine pregnancy test is not performed today. Recommend she take a HPT or can go to [facility] for UPT. Sx likely viral but rapid strep pending, will notify if positive. Supportive measures reviewed. Stable for discharge home.  Assessment and Plan   1. Sore throat (viral)   2. Upper respiratory virus   3. Amenorrhea    Discharge home Hydrate Rest Mucinex q12 hrs Tylenol prn Robitussin prn cough  Allergies as of 07/03/2016   No Known Allergies     Medication List    STOP taking these medications   doxycycline 100 MG tablet Commonly known as:  VIBRA-TABS   oxyCODONE-acetaminophen 5-325 MG tablet Commonly known as:  PERCOCET/ROXICET     TAKE these medications  ibuprofen 600 MG tablet Commonly known as:  ADVIL,MOTRIN Take 1 tablet (600 mg total) by mouth every 6 (six) hours.      Julianne Handler, CNM 07/03/2016, 8:03 PM

## 2016-07-03 NOTE — Discharge Instructions (Signed)
°  Upper Respiratory Infection, Adult Most upper respiratory infections (URIs) are caused by a virus. A URI affects the nose, throat, and upper air passages. The most common type of URI is often called "the common cold." Follow these instructions at home:  Take medicines only as told by your doctor.  Gargle warm saltwater or take cough drops to comfort your throat as told by your doctor.  Use a warm mist humidifier or inhale steam from a shower to increase air moisture. This may make it easier to breathe.  Drink enough fluid to keep your pee (urine) clear or pale yellow.  Eat soups and other clear broths.  Have a healthy diet.  Rest as needed.  Go back to work when your fever is gone or your doctor says it is okay.  You may need to stay home longer to avoid giving your URI to others.  You can also wear a face mask and wash your hands often to prevent spread of the virus.  Use your inhaler more if you have asthma.  Do not use any tobacco products, including cigarettes, chewing tobacco, or electronic cigarettes. If you need help quitting, ask your doctor. Contact a doctor if:  You are getting worse, not better.  Your symptoms are not helped by medicine.  You have chills.  You are getting more short of breath.  You have brown or red mucus.  You have yellow or brown discharge from your nose.  You have pain in your face, especially when you bend forward.  You have a fever.  You have puffy (swollen) neck glands.  You have pain while swallowing.  You have white areas in the back of your throat. Get help right away if:  You have very bad or constant:  Headache.  Ear pain.  Pain in your forehead, behind your eyes, and over your cheekbones (sinus pain).  Chest pain.  You have long-lasting (chronic) lung disease and any of the following:  Wheezing.  Long-lasting cough.  Coughing up blood.  A change in your usual mucus.  You have a stiff neck.  You have  changes in your:  Vision.  Hearing.  Thinking.  Mood. This information is not intended to replace advice given to you by your health care provider. Make sure you discuss any questions you have with your health care provider. Document Released: 09/12/2007 Document Revised: 11/27/2015 Document Reviewed: 07/01/2013 Elsevier Interactive Patient Education  2017 Elsevier Inc.   Tylenol as needed Mucinex (or generic) every 12 hours Cough medication over the counter as needed Drink a lot of water Rest

## 2016-07-06 LAB — CULTURE, GROUP A STREP (THRC)

## 2017-03-08 ENCOUNTER — Emergency Department (HOSPITAL_COMMUNITY)
Admission: EM | Admit: 2017-03-08 | Discharge: 2017-03-08 | Disposition: A | Discharge status: Home / Self Care | Payer: Self-pay | Attending: Emergency Medicine | Admitting: Emergency Medicine

## 2017-03-08 ENCOUNTER — Encounter (HOSPITAL_COMMUNITY): Payer: Self-pay | Admitting: Emergency Medicine

## 2017-03-08 DIAGNOSIS — L02416 Cutaneous abscess of left lower limb: Secondary | ICD-10-CM | POA: Insufficient documentation

## 2017-03-08 DIAGNOSIS — L0291 Cutaneous abscess, unspecified: Secondary | ICD-10-CM

## 2017-03-08 MED ORDER — LIDOCAINE-EPINEPHRINE (PF) 2 %-1:200000 IJ SOLN
10.0000 mL | Freq: Once | INTRAMUSCULAR | Status: AC
  Administered 2017-03-08: 10 mL
  Filled 2017-03-08: qty 20

## 2017-03-08 MED ORDER — DOXYCYCLINE HYCLATE 100 MG PO CAPS: 100.0000 mg | ORAL_CAPSULE | Freq: Two times a day (BID) | ORAL | 0 refills | Status: DC

## 2017-03-08 NOTE — ED Notes (Signed)
I&D tray placed at bedside for provider.

## 2017-03-08 NOTE — ED Triage Notes (Signed)
Patient reports skin abscess at left shin onset 2 days ago with redness/swelling .

## 2017-03-08 NOTE — ED Provider Notes (Signed)
[facility] EMERGENCY DEPARTMENT Provider Note   CSN: [id number] Arrival date & time: 03/08/17  0224     History   Chief Complaint Chief Complaint  Patient presents with  . Abscess    HPI Brandy Knight is a 26 y.o. female.  Patient presents to the ED with a chief complaint of abscess to left lower leg.  She states that she noticed the infection 2 days ago.  She reports that it has gradually worsened.  She states that she may have been bitten by something, but didn't see anything bite her.  She denies fevers, chills, nausea, or vomiting.  She denies any other associated symptoms.  She has not taken anything for her symptoms.   The history is provided by the patient. No language interpreter was used.    Past Medical History:  Diagnosis Date  . Medical history non-contributory     Patient Active Problem List   Diagnosis Date Noted  . Routine postpartum follow-up 11/06/2013  . 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 NO PAST SURGERIES    . WISDOM TOOTH EXTRACTION      OB History    Gravida Para Term Preterm AB Living   1 1 1     1    SAB TAB Ectopic Multiple Live Births           1       Home Medications    Prior to Admission medications   Medication Sig Start Date End Date Taking? Authorizing Provider  ibuprofen (ADVIL,MOTRIN) 600 MG tablet Take 1 tablet (600 mg total) by mouth every 6 (six) hours. 09/19/13   Osborne Oman, MD    Family History Family History  Problem Relation Age of Onset  . Diabetes Mother   . Diabetes Father     Social History Social History   Tobacco Use  . Smoking status: Never Smoker  . Smokeless tobacco: Never Used  Substance Use Topics  . Alcohol use: No  . Drug use: No     Allergies   Patient has no known allergies.   Review of Systems Review of Systems  All other systems reviewed and are negative.    Physical Exam Updated Vital  Signs BP 129/81 (BP Location: Right Arm)   Pulse 90   Temp 98.1 F (36.7 C) (Oral)   Resp 18   LMP 03/01/2017 (Approximate)   SpO2 100%   Physical Exam  Constitutional: She is oriented to person, place, and time. She appears well-developed and well-nourished.  HENT:  Head: Normocephalic and atraumatic.  Eyes: Conjunctivae and EOM are normal.  Neck: Normal range of motion.  Cardiovascular: Normal rate.  Pulmonary/Chest: Effort normal.  Abdominal: She exhibits no distension.  Musculoskeletal: Normal range of motion.  Neurological: She is alert and oriented to person, place, and time.  Skin: Skin is dry.  Small 1x1 cm abscess to left shin with 4x4cm area of surrounding erythema.  Psychiatric: She has a normal mood and affect. Her behavior is normal. Judgment and thought content normal.  Nursing note and vitals reviewed.    ED Treatments / Results  Labs (all labs ordered are listed, but only abnormal results are displayed) Labs Reviewed - No data to display  EKG  EKG Interpretation None       Radiology No results found.  Procedures Procedures (including critical care time) INCISION AND DRAINAGE Performed by: Montine Circle Consent: Verbal consent obtained. Risks  and benefits: risks, benefits and alternatives were discussed Type: abscess  Body area: left shin  Anesthesia: local infiltration  Incision was made with a scalpel.  Local anesthetic: lidocaine 1% with epinephrine  Anesthetic total: 4 ml  Complexity: complex Blunt dissection to break up loculations  Drainage: purulent  Drainage amount: mild  Packing material: not packed  Patient tolerance: Patient tolerated the procedure well with no immediate complications.    Medications Ordered in ED Medications  lidocaine-EPINEPHrine (XYLOCAINE W/EPI) 2 %-1:200000 (PF) injection 10 mL (not administered)     Initial Impression / Assessment and Plan / ED Course  I have reviewed the triage vital  signs and the nursing notes.  Pertinent labs & imaging results that were available during my care of the patient were reviewed by me and considered in my medical decision making (see chart for details).     Patient with skin abscess amenable to incision and drainage.  Abscess was not large enough to warrant packing or drain,  wound recheck in 2 days. Encouraged home warm soaks and flushing.  Mild signs of cellulitis is surrounding skin.  Will d/c to home.  No antibiotic therapy is indicated.   Final Clinical Impressions(s) / ED Diagnoses   Final diagnoses:  Abscess    ED Discharge Orders    None       Montine Circle, PA-C 03/08/17 0445    Ezequiel Essex, MD 03/08/17 [phone]

## 2017-03-08 NOTE — ED Notes (Signed)
ED Provider at bedside. 

## 2018-02-26 ENCOUNTER — Encounter (HOSPITAL_COMMUNITY): Payer: Self-pay | Admitting: *Deleted

## 2018-02-26 ENCOUNTER — Emergency Department (HOSPITAL_COMMUNITY)
Admission: EM | Admit: 2018-02-26 | Discharge: 2018-02-26 | Disposition: A | Discharge status: Home / Self Care | Payer: Self-pay | Attending: Emergency Medicine | Admitting: Emergency Medicine

## 2018-02-26 DIAGNOSIS — H0014 Chalazion left upper eyelid: Secondary | ICD-10-CM | POA: Insufficient documentation

## 2018-02-26 DIAGNOSIS — Z79899 Other long term (current) drug therapy: Secondary | ICD-10-CM | POA: Insufficient documentation

## 2018-02-26 NOTE — Discharge Instructions (Addendum)
Please read attached information. If you experience any new or worsening signs or symptoms please return to the emergency room for evaluation. Please follow-up with your primary care provider or specialist as discussed.  °

## 2018-02-26 NOTE — ED Provider Notes (Signed)
[facility] EMERGENCY DEPARTMENT Provider Note   CSN: [id number] Arrival date & time: 02/26/18  0940   History   Chief Complaint Chief Complaint  Patient presents with  . Eye Pain    HPI Brandy Knight is a 27 y.o. female.  HPI   27 year old female presents today with complaints of eyelid swelling.  Patient notes symptoms started 2 days ago with redness and pain to the left upper eyelid.  She notes yesterday she had some watering of her eye, no discharge, no watering today, no vision changes, no surrounding redness or fever.  No history of the same.  Patient notes she did put a warm wash rag on the eye this morning which seemed to improve her symptoms.  Past Medical History:  Diagnosis Date  . Medical history non-contributory     Patient Active Problem List   Diagnosis Date Noted  . Routine postpartum follow-up 11/06/2013  . 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 NO PAST SURGERIES    . WISDOM TOOTH EXTRACTION       OB History    Gravida  1   Para  1   Term  1   Preterm      AB      Living  1     SAB      TAB      Ectopic      Multiple      Live Births  1            Home Medications    Prior to Admission medications   Medication Sig Start Date End Date Taking? Authorizing Provider  doxycycline (VIBRAMYCIN) 100 MG capsule Take 1 capsule (100 mg total) by mouth 2 (two) times daily. 03/08/17   Montine Circle, PA-C  ibuprofen (ADVIL,MOTRIN) 600 MG tablet Take 1 tablet (600 mg total) by mouth every 6 (six) hours. 09/19/13   Osborne Oman, MD    Family History Family History  Problem Relation Age of Onset  . Diabetes Mother   . Diabetes Father     Social History Social History   Tobacco Use  . Smoking status: Never Smoker  . Smokeless tobacco: Never Used  Substance Use Topics  . Alcohol use: No  . Drug use: No     Allergies   Patient has no known  allergies.   Review of Systems Review of Systems  All other systems reviewed and are negative.    Physical Exam Updated Vital Signs BP 115/63   Pulse 62   Temp 98.9 F (37.2 C) (Oral)   Resp 20   SpO2 100%   Physical Exam  Constitutional: She is oriented to person, place, and time. She appears well-developed and well-nourished.  HENT:  Head: Normocephalic and atraumatic.  Eyes: Pupils are equal, round, and reactive to light. Conjunctivae are normal. Right eye exhibits no discharge. Left eye exhibits no discharge. No scleral icterus.  Left upper eyelid with slight redness and firmness along the eyelashes, no fluctuance, no discharge, no surrounding redness, no pain with ocular movements, palpebral and bulbar conjunctive a clear with no erythema vision normal  Neck: Normal range of motion. No JVD present. No tracheal deviation present.  Pulmonary/Chest: Effort normal. No stridor.  Neurological: She is alert and oriented to person, place, and time. Coordination normal.  Psychiatric: She has a normal mood and affect. Her behavior is normal. Judgment and thought content normal.  Nursing  note and vitals reviewed.    ED Treatments / Results  Labs (all labs ordered are listed, but only abnormal results are displayed) Labs Reviewed - No data to display  EKG None  Radiology No results found.  Procedures Procedures (including critical care time)  Medications Ordered in ED Medications - No data to display   Initial Impression / Assessment and Plan / ED Course  I have reviewed the triage vital signs and the nursing notes.  Pertinent labs & imaging results that were available during my care of the patient were reviewed by me and considered in my medical decision making (see chart for details).     27 year old female presents today with chalazion.  No comp gating features, warm compress instructions given, strict return precautions given.  Patient verbalized understanding  and agreement to today's plan.  Final Clinical Impressions(s) / ED Diagnoses   Final diagnoses:  Chalazion of left upper eyelid    ED Discharge Orders    None       Okey Regal, PA-C 02/26/18 1001    Charlesetta Shanks, MD 02/27/18 [phone]

## 2018-02-26 NOTE — ED Triage Notes (Signed)
Pt in c/o right eye pain and swelling, noticed discharge this am, denies vision changes

## 2019-10-07 ENCOUNTER — Ambulatory Visit: Payer: Self-pay

## 2020-04-21 ENCOUNTER — Inpatient Hospital Stay (HOSPITAL_COMMUNITY)
Admission: AD | Admit: 2020-04-21 | Discharge: 2020-04-23 | DRG: 607 | Disposition: A | Discharge status: Home / Self Care | Payer: Medicaid Other | Attending: Obstetrics and Gynecology | Admitting: Obstetrics and Gynecology

## 2020-04-21 ENCOUNTER — Other Ambulatory Visit: Payer: Self-pay

## 2020-04-21 ENCOUNTER — Encounter (HOSPITAL_COMMUNITY): Payer: Self-pay | Admitting: Obstetrics and Gynecology

## 2020-04-21 ENCOUNTER — Inpatient Hospital Stay (HOSPITAL_COMMUNITY): Payer: Medicaid Other

## 2020-04-21 DIAGNOSIS — Z3687 Encounter for antenatal screening for uncertain dates: Secondary | ICD-10-CM

## 2020-04-21 DIAGNOSIS — D4959 Neoplasm of unspecified behavior of other genitourinary organ: Secondary | ICD-10-CM | POA: Diagnosis not present

## 2020-04-21 DIAGNOSIS — D279 Benign neoplasm of unspecified ovary: Secondary | ICD-10-CM | POA: Diagnosis present

## 2020-04-21 DIAGNOSIS — Q820 Hereditary lymphedema: Principal | ICD-10-CM

## 2020-04-21 DIAGNOSIS — J90 Pleural effusion, not elsewhere classified: Secondary | ICD-10-CM

## 2020-04-21 DIAGNOSIS — Z833 Family history of diabetes mellitus: Secondary | ICD-10-CM

## 2020-04-21 DIAGNOSIS — O0933 Supervision of pregnancy with insufficient antenatal care, third trimester: Secondary | ICD-10-CM

## 2020-04-21 DIAGNOSIS — N9489 Other specified conditions associated with female genital organs and menstrual cycle: Secondary | ICD-10-CM | POA: Diagnosis not present

## 2020-04-21 DIAGNOSIS — Z20822 Contact with and (suspected) exposure to covid-19: Secondary | ICD-10-CM | POA: Diagnosis present

## 2020-04-21 DIAGNOSIS — R188 Other ascites: Secondary | ICD-10-CM

## 2020-04-21 DIAGNOSIS — Z9889 Other specified postprocedural states: Secondary | ICD-10-CM | POA: Diagnosis not present

## 2020-04-21 DIAGNOSIS — D3911 Neoplasm of uncertain behavior of right ovary: Secondary | ICD-10-CM | POA: Diagnosis not present

## 2020-04-21 LAB — CBC
HCT: 48.5 % — ABNORMAL HIGH (ref 36.0–46.0)
Hemoglobin: 15.1 g/dL — ABNORMAL HIGH (ref 12.0–15.0)
MCH: 24.6 pg — ABNORMAL LOW (ref 26.0–34.0)
MCHC: 31.1 g/dL (ref 30.0–36.0)
MCV: 79.1 fL — ABNORMAL LOW (ref 80.0–100.0)
Platelets: 211 10*3/uL (ref 150–400)
RBC: 6.13 MIL/uL — ABNORMAL HIGH (ref 3.87–5.11)
RDW: 14.5 % (ref 11.5–15.5)
WBC: 6.7 10*3/uL (ref 4.0–10.5)
nRBC: 0 % (ref 0.0–0.2)

## 2020-04-21 LAB — COMPREHENSIVE METABOLIC PANEL
ALT: 21 U/L (ref 0–44)
AST: 35 U/L (ref 15–41)
Albumin: 3 g/dL — ABNORMAL LOW (ref 3.5–5.0)
Alkaline Phosphatase: 50 U/L (ref 38–126)
Anion gap: 13 (ref 5–15)
BUN: 9 mg/dL (ref 6–20)
CO2: 22 mmol/L (ref 22–32)
Calcium: 8.8 mg/dL — ABNORMAL LOW (ref 8.9–10.3)
Chloride: 95 mmol/L — ABNORMAL LOW (ref 98–111)
Creatinine, Ser: 1.05 mg/dL — ABNORMAL HIGH (ref 0.44–1.00)
GFR, Estimated: >60 mL/min (ref >60)
Glucose, Bld: 116 mg/dL — ABNORMAL HIGH (ref 70–99)
Potassium: 5.7 mmol/L — ABNORMAL HIGH (ref 3.5–5.1)
Sodium: 130 mmol/L — ABNORMAL LOW (ref 135–145)
Total Bilirubin: 0.9 mg/dL (ref 0.3–1.2)
Total Protein: 7 g/dL (ref 6.5–8.1)

## 2020-04-21 LAB — URINALYSIS, ROUTINE W REFLEX MICROSCOPIC
Glucose, UA: NEGATIVE mg/dL
Hgb urine dipstick: NEGATIVE
Ketones, ur: 5 mg/dL — AB
Nitrite: NEGATIVE
Protein, ur: 100 mg/dL — AB
Specific Gravity, Urine: 1.029 (ref 1.005–1.030)
pH: 5 (ref 5.0–8.0)

## 2020-04-21 LAB — HIV ANTIBODY (ROUTINE TESTING W REFLEX): HIV Screen 4th Generation wRfx: NONREACTIVE

## 2020-04-21 LAB — C-REACTIVE PROTEIN: CRP: <0.5 mg/dL (ref <1.0)

## 2020-04-21 LAB — PROTIME-INR
INR: 1.2 (ref 0.8–1.2)
Prothrombin Time: 14.7 seconds (ref 11.4–15.2)

## 2020-04-21 LAB — HEMOGLOBIN A1C
Hgb A1c MFr Bld: 5.5 % (ref 4.8–5.6)
Mean Plasma Glucose: 111.15 mg/dL

## 2020-04-21 LAB — HEPATITIS B SURFACE ANTIGEN: Hepatitis B Surface Ag: NONREACTIVE

## 2020-04-21 LAB — HCG, SERUM, QUALITATIVE: Preg, Serum: NEGATIVE

## 2020-04-21 LAB — HCG, QUANTITATIVE, PREGNANCY: hCG, Beta Chain, Quant, S: <1 m[IU]/mL (ref <5)

## 2020-04-21 LAB — TYPE AND SCREEN
ABO/RH(D): AB POS
Antibody Screen: NEGATIVE

## 2020-04-21 LAB — APTT: aPTT: 30 seconds (ref 24–36)

## 2020-04-21 LAB — LACTATE DEHYDROGENASE: LDH: 447 U/L — ABNORMAL HIGH (ref 98–192)

## 2020-04-21 LAB — HEPATITIS C ANTIBODY: HCV Ab: NONREACTIVE

## 2020-04-21 LAB — SARS CORONAVIRUS 2 (TAT 6-24 HRS): SARS Coronavirus 2: NEGATIVE

## 2020-04-21 MED ORDER — IOHEXOL 300 MG/ML  SOLN
100.0000 mL | Freq: Once | INTRAMUSCULAR | Status: AC | PRN
  Administered 2020-04-21: 100 mL via INTRAVENOUS

## 2020-04-21 MED ORDER — OXYCODONE HCL 5 MG PO TABS: 5.0000 mg | ORAL_TABLET | ORAL | Status: DC | PRN

## 2020-04-21 MED ORDER — ACETAMINOPHEN 325 MG PO TABS: 650.0000 mg | ORAL_TABLET | ORAL | Status: DC | PRN

## 2020-04-21 MED ORDER — ONDANSETRON HCL 4 MG PO TABS: 4.0000 mg | ORAL_TABLET | Freq: Four times a day (QID) | ORAL | Status: DC | PRN

## 2020-04-21 MED ORDER — POLYETHYLENE GLYCOL 3350 17 G PO PACK
17.0000 g | PACK | Freq: Every day | ORAL | Status: DC | PRN
  Filled 2020-04-21: qty 1

## 2020-04-21 MED ORDER — PANTOPRAZOLE SODIUM 40 MG PO TBEC
40.0000 mg | DELAYED_RELEASE_TABLET | Freq: Every day | ORAL | Status: DC | PRN
  Filled 2020-04-21: qty 1

## 2020-04-21 MED ORDER — ONDANSETRON HCL 4 MG/2ML IJ SOLN: 4.0000 mg | Freq: Four times a day (QID) | INTRAMUSCULAR | Status: DC | PRN

## 2020-04-21 NOTE — ED Provider Notes (Signed)
HPI:  30 year old female G2, P1, approximately 7-8 months pregnant, unknown last LMP who presents for evaluation of lower abdominal cramping.  Began 2 to 3 days ago.  Pain is not worsened.  No recent trauma.  No fluid leakage, vaginal bleeding.  States she feels baby move.  She has had no established prenatal care. No medications. Rates pain  3/10. Described as cramping.  ROS:   Pos- Abdominal pain, lower abdomen Neg- No vaginal bleeding, fluid leakage, fever, chills, nausea, diarrhea, weakness  PE:   General-appears overall well Head- Atraumatic  CV- Clear Pulm- No respiratory distress Abd- gravid abdomen GU- deferred to MAU MSK- Moves all extremities Neuro- Ambulatory without difficulty Skin- No rshes  Patient medically cleared from ED.  Discussed with APP in MAU.  They agreed to accept patient in transfer for further work-up of abdominal pain in setting of pregnancy.  Patient hemodynamically stable at time of transfer.  MSE was initiated and I personally evaluated the patient and placed orders (if any) at  3:26 PM on April 21, 2020.  The patient appears stable so that the remainder of the MSE may be completed by another provider.   Cristopher Ciccarelli A, PA-C 04/21/20 1527    Elnora Morrison, MD 04/21/20 [phone]

## 2020-04-21 NOTE — ED Notes (Signed)
PA assessed pt in triage. Report given to MAU charge. Pt transported to MAU.

## 2020-04-21 NOTE — MAU Provider Note (Addendum)
History     BP:[id number]  Arrival date and time: 04/21/20 1510    Chief Complaint  Patient presents with  . Shortness of Breath  . Abdominal Pain     HPI Brandy Knight is a 30 y.o. at Unknown gestational age with PMHx notable for 1 prior uncomplicated vaginal delivery, who presents for abdominal pain.   Patient initially presented to the main ED reporting she was 7 to 8 months pregnant and having lower abdominal pain.  Given patient report of pregnancy and gravid appearing abdomen she was transferred to the MAU.  On my history patient reports that she had a positive pregnancy test about a month ago.  She notes that her abdomen has become increasingly uncomfortable over the last several days, and that it has become more distended over the past several months.  She has not been using any form of birth control, she is unsure of her last period.  She denies any loss of fluid or vaginal bleeding, she states she does feel baby move.  She has not had any prenatal care during this pregnancy, unable to articulate to me why.  She denies the use of any illicit substances or alcohol.   --/--/AB POS (01/13 1605)  OB History    Gravida  2   Para  1   Term  1   Preterm      AB      Living  1     SAB      IAB      Ectopic      Multiple      Live Births  1           Past Medical History:  Diagnosis Date  . Medical history non-contributory     Past Surgical History:  Procedure Laterality Date  . NO PAST SURGERIES    . WISDOM TOOTH EXTRACTION      Family History  Problem Relation Age of Onset  . Diabetes Mother   . Diabetes Father     Social History   Socioeconomic History  . Marital status: Single    Spouse name: Not on file  . Number of children: Not on file  . Years of education: Not on file  . Highest education level: Not on file  Occupational History  . Not on file  Tobacco Use  . Smoking status: Never Smoker  . Smokeless tobacco: Never Used   Substance and Sexual Activity  . Alcohol use: No  . Drug use: No  . Sexual activity: Yes  Other Topics Concern  . Not on file  Social History Narrative  . Not on file   Social Determinants of Health   Financial Resource Strain: Not on file  Food Insecurity: Not on file  Transportation Needs: Not on file  Physical Activity: Not on file  Stress: Not on file  Social Connections: Not on file  Intimate Partner Violence: Not on file    No Known Allergies  No current facility-administered medications on file prior to encounter.   Current Outpatient Medications on File Prior to Encounter  Medication Sig Dispense Refill  . doxycycline (VIBRAMYCIN) 100 MG capsule Take 1 capsule (100 mg total) by mouth 2 (two) times daily. 20 capsule 0  . ibuprofen (ADVIL,MOTRIN) 600 MG tablet Take 1 tablet (600 mg total) by mouth every 6 (six) hours. 60 tablet 2     ROS Pertinent positives and negative per HPI, all others reviewed and negative  Physical Exam  BP (!) 118/91   Pulse (!) 124   Temp 98.2 F (36.8 C)   Resp 18   Ht 5\' 2"  (1.575 m)   Wt 77.1 kg   SpO2 96%   Breastfeeding Unknown   BMI 31.09 kg/m   Physical Exam Vitals reviewed.  Constitutional:      General: She is not in acute distress.    Appearance: She is well-developed and well-nourished. She is not diaphoretic.  Eyes:     General: No scleral icterus. Pulmonary:     Effort: Pulmonary effort is normal. No respiratory distress.  Abdominal:     General: There is distension.     Palpations: Abdomen is soft.     Tenderness: There is no abdominal tenderness. There is no guarding or rebound.  Musculoskeletal:        General: No edema.  Skin:    General: Skin is warm and dry.  Neurological:     Mental Status: She is alert.     Coordination: Coordination normal.  Psychiatric:        Mood and Affect: Mood and affect normal.      Cervical Exam    Bedside Ultrasound Pt informed that the ultrasound is  considered a limited OB ultrasound and is not intended to be a complete ultrasound exam.  Patient also informed that the ultrasound is not being completed with the intent of assessing for fetal or placental anomalies or any pelvic abnormalities.  Explained that the purpose of today's ultrasound is to assess for  presentation and viability.  Patient acknowledges the purpose of the exam and the limitations of the study.    My interpretation: Bedside ultrasound demonstrated an empty uterus with large volume ascites throughout the abdomen.  Uterus enlarged and with abnormal appearance.  No pregnancy identified.  FHT n/a  Labs Results for orders placed or performed during the hospital encounter of 04/21/20 (from the past 24 hour(s))  CBC     Status: Abnormal   Collection Time: 04/21/20  3:57 PM  Result Value Ref Range   WBC 6.7 4.0 - 10.5 K/uL   RBC 6.13 (H) 3.87 - 5.11 MIL/uL   Hemoglobin 15.1 (H) 12.0 - 15.0 g/dL   HCT 48.5 (H) 36.0 - 46.0 %   MCV 79.1 (L) 80.0 - 100.0 fL   MCH 24.6 (L) 26.0 - 34.0 pg   MCHC 31.1 30.0 - 36.0 g/dL   RDW 14.5 11.5 - 15.5 %   Platelets 211 150 - 400 K/uL   nRBC 0.0 0.0 - 0.2 %  Hemoglobin A1c     Status: None   Collection Time: 04/21/20  3:57 PM  Result Value Ref Range   Hgb A1c MFr Bld 5.5 4.8 - 5.6 %   Mean Plasma Glucose 111.15 mg/dL  Type and screen [place]     Status: None   Collection Time: 04/21/20  4:05 PM  Result Value Ref Range   ABO/RH(D) AB POS    Antibody Screen NEG    Sample Expiration      04/24/2020,2359 Performed at [facility], [place] [address], [place], [place] [address]     Imaging No results found.  MAU Course  Procedures  Lab Orders     Urine Culture     HIV Antibody (routine testing w rflx)     Hepatitis B surface antigen     Hepatitis C antibody     Urinalysis, Routine w reflex microscopic     CEA  AFP tumor marker     hCG, serum, qualitative     Lactate dehydrogenase     C-reactive  protein     CA 125     Comprehensive metabolic panel     Protime-INR     APTT     CBC with Differential No orders of the defined types were placed in this encounter.   Imaging Orders     CT ABDOMEN PELVIS W CONTRAST  MDM High  Assessment and Plan  #Abdominal Ascites Patient presenting reporting enlarging abdomen and positive pregnancy test previously, however on bedside ultrasound no pregnancy identified a large volume ascites seen throughout the abdomen.  Was concerning for malignancy given her presentation, possibly hormone secreting tumor given positive pregnancy test previously.  Will evaluate with abdominal CT with contrast and also draw basic labs and tumor markers. 5:27 PM  Labs so far mostly unrevealing.  MP abnormal with sodium of 130, potassium 5.7, creatinine 1.05, normal LFTs and bili.  Coags are normal, LDH mildly elevated at 447, HIV/hep C/hep B are all negative.  hCG negative as well, tumor marker still pending.  Patient has completed CT abdomen pelvis with contrast however final read is not available.  On my review of the images large volume ascites is confirmed, possible mass arising from right adnexa but difficult to interpret.  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  Care signed out to Hansel Feinstein, CNM with likely plan to admit to an oncology service at [facility] pending final read of CT scan. 10:22 PM   Clarnce Flock CT results reviewed CT ABDOMEN PELVIS W CONTRAST  Result Date: 04/21/2020 CLINICAL DATA:  Ascites 29 y/o presented thinking she was pregnant, no pregnancy seen on [place], massive ascites with abnormal appearance of uterus on bedside US EXAM: CT ABDOMEN AND PELVIS WITH CONTRAST TECHNIQUE: Multidetector CT imaging of the abdomen and pelvis was performed using the standard protocol following bolus administration of intravenous contrast. CONTRAST:  132mL OMNIPAQUE IOHEXOL 300 MG/ML  SOLN  COMPARISON:  None. FINDINGS: Lower chest: Large right pleural effusion. Compressive atelectasis of the included right lung. There is leftward mediastinal shift. Hepatobiliary: Low-density adjacent to the falciform ligament is typical of focal fatty infiltration. There is no discrete focal liver lesion. The gallbladder is physiologically distended. There is no calcified gallstone. No intrahepatic biliary ductal dilatation. Common bile duct is not well seen. Pancreas: No ductal dilatation or inflammation. There is no evidence of pancreatic mass. Spleen: Normal in size. Cleft posteriorly. No focal splenic lesion. Adrenals/Urinary Tract: No adrenal nodule. No hydronephrosis. There is no perinephric edema. The urinary bladder appears completely decompressed. Stomach/Bowel: Bowel evaluation is limited in the absence of enteric contrast and presence of massive ascites. Ascites causes central displacement of small-bowel loops. No small bowel distension. Normal appendix tentatively visualized, series 3, image 65. Majority of the colon is decompressed. There is no obvious colonic wall thickening. Vascular/Lymphatic: Normal caliber abdominal aorta. Patent portal vein. There is prominence of the left ovarian and gonadal vasculature. No evidence of adenopathy, allowing for limitations related to ascites. Reproductive: Heterogeneous enhancing large right adnexal mass measures 14.7 x 12.8 x 13.6 cm. Lobulated enhancing components peripherally with central low-density. No normal right ovary is seen. There is a clear fat plane between this lesion and the uterus. Normal left ovary is tentatively visualized superior to the uterus just to the left of midline. Cystic structure in the region of the cervix is likely  a nabothian cyst. Other: Massive abdominopelvic ascites. Small amount ascites leaks into the umbilicus. There is generalized subcutaneous edema. No free air. There is no obvious omental thickening, however degree of ascites  partially obscures. Musculoskeletal: There are no acute or suspicious osseous abnormalities. IMPRESSION: 1. 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 Ovarian lesion is typically a fibroma in this setting. There is a cleavage plane between the adnexal mass and the uterus, which makes a pedunculated fibroid unlikely. 2. There is a probable nabothian cyst in the cervix. 3. Ascites causes mass effect on the abdominopelvic structures. Electronically Signed   By: Keith Rake M.D.   On: 04/21/2020 21:59   Consulted Dr Ilda Basset with presentation and results He will arrange for transfer to Gyn/Onc service

## 2020-04-21 NOTE — MAU Note (Incomplete)
Attempted to get FHR in triage. Maternal HR running between 125-135b/m. Fetal doppler picking up maternal HR unable to find fetal HR. Had pt go into exam room and layed her down. Attempted to doppler FHR again. Still hearing only materna HR at 125-135. Requested Dr. Dione Plover to come to bedside with U/S. U/S showed no fetus un uterus but a lot of acuities in abd.. Pt stated she had a positive pregnancy test about a month ago.

## 2020-04-21 NOTE — H&P (Signed)
History     BP:[id number]  Arrival date and time: 04/21/20 1510       Chief Complaint  Patient presents with  . Shortness of Breath  . Abdominal Pain     HPI Brandy Knight is a 30 y.o. at Unknown gestational age with PMHx notable for 1 prior uncomplicated vaginal delivery, who presents for abdominal pain.   Patient initially presented to the main ED reporting she was 7 to 8 months pregnant and having lower abdominal pain.  Given patient report of pregnancy and gravid appearing abdomen she was transferred to the MAU.  On my history patient reports that she had a positive pregnancy test about a month ago.  She notes that her abdomen has become increasingly uncomfortable over the last several days, and that it has become more distended over the past several months.  She has not been using any form of birth control, she is unsure of her last period.  She denies any loss of fluid or vaginal bleeding, she states she does feel baby move.  She has not had any prenatal care during this pregnancy, unable to articulate to me why.  She denies the use of any illicit substances or alcohol.   --/--/AB POS (01/13 1605)          OB History    Gravida  2   Para  1   Term  1   Preterm      AB      Living  1     SAB      IAB      Ectopic      Multiple      Live Births  1               Past Medical History:  Diagnosis Date  . Medical history non-contributory          Past Surgical History:  Procedure Laterality Date  . NO PAST SURGERIES    . WISDOM TOOTH EXTRACTION           Family History  Problem Relation Age of Onset  . Diabetes Mother   . Diabetes Father     Social History        Socioeconomic History  . Marital status: Single    Spouse name: Not on file  . Number of children: Not on file  . Years of education: Not on file  . Highest education level: Not on file  Occupational History  . Not on file  Tobacco Use  .  Smoking status: Never Smoker  . Smokeless tobacco: Never Used  Substance and Sexual Activity  . Alcohol use: No  . Drug use: No  . Sexual activity: Yes  Other Topics Concern  . Not on file  Social History Narrative  . Not on file   Social Determinants of Health   Financial Resource Strain: Not on file  Food Insecurity: Not on file  Transportation Needs: Not on file  Physical Activity: Not on file  Stress: Not on file  Social Connections: Not on file  Intimate Partner Violence: Not on file    No Known Allergies  No current facility-administered medications on file prior to encounter.         Current Outpatient Medications on File Prior to Encounter  Medication Sig Dispense Refill  . doxycycline (VIBRAMYCIN) 100 MG capsule Take 1 capsule (100 mg total) by mouth 2 (two) times daily. 20 capsule 0  . ibuprofen (ADVIL,MOTRIN) 600 MG  tablet Take 1 tablet (600 mg total) by mouth every 6 (six) hours. 60 tablet 2     ROS Pertinent positives and negative per HPI, all others reviewed and negative  Physical Exam   BP (!) 118/91   Pulse (!) 124   Temp 98.2 F (36.8 C)   Resp 18   Ht 5\' 2"  (1.575 m)   Wt 77.1 kg   SpO2 96%   Breastfeeding Unknown   BMI 31.09 kg/m   Physical Exam Vitals reviewed.  Constitutional:      General: She is not in acute distress.    Appearance: She is well-developed and well-nourished. She is not diaphoretic.  Eyes:     General: No scleral icterus. Pulmonary:     Effort: Pulmonary effort is normal. No respiratory distress.  Abdominal:     General: There is distension.     Palpations: Abdomen is soft.     Tenderness: There is no abdominal tenderness. There is no guarding or rebound.  Musculoskeletal:        General: No edema.  Skin:    General: Skin is warm and dry.  Neurological:     Mental Status: She is alert.     Coordination: Coordination normal.  Psychiatric:        Mood and Affect: Mood and affect normal.       Cervical Exam  Bedside Ultrasound Pt informed that the ultrasound is considered a limited OB ultrasound and is not intended to be a complete ultrasound exam.  Patient also informed that the ultrasound is not being completed with the intent of assessing for fetal or placental anomalies or any pelvic abnormalities.  Explained that the purpose of today's ultrasound is to assess for  presentation and viability.  Patient acknowledges the purpose of the exam and the limitations of the study.    My interpretation: Bedside ultrasound demonstrated an empty uterus with large volume ascites throughout the abdomen.  Uterus enlarged and with abnormal appearance.  No pregnancy identified.  FHT n/a  Labs Lab Results Last 24 Hours        Results for orders placed or performed during the hospital encounter of 04/21/20 (from the past 24 hour(s))  CBC     Status: Abnormal   Collection Time: 04/21/20  3:57 PM  Result Value Ref Range   WBC 6.7 4.0 - 10.5 K/uL   RBC 6.13 (H) 3.87 - 5.11 MIL/uL   Hemoglobin 15.1 (H) 12.0 - 15.0 g/dL   HCT 48.5 (H) 36.0 - 46.0 %   MCV 79.1 (L) 80.0 - 100.0 fL   MCH 24.6 (L) 26.0 - 34.0 pg   MCHC 31.1 30.0 - 36.0 g/dL   RDW 14.5 11.5 - 15.5 %   Platelets 211 150 - 400 K/uL   nRBC 0.0 0.0 - 0.2 %  Hemoglobin A1c     Status: None   Collection Time: 04/21/20  3:57 PM  Result Value Ref Range   Hgb A1c MFr Bld 5.5 4.8 - 5.6 %   Mean Plasma Glucose 111.15 mg/dL  Type and screen [place]     Status: None   Collection Time: 04/21/20  4:05 PM  Result Value Ref Range   ABO/RH(D) AB POS    Antibody Screen NEG    Sample Expiration      04/24/2020,2359 Performed at [facility], [place] [address] [place], [place] [address]       Imaging No results found.  MAU Course  Procedures  Lab Orders     Urine Culture     HIV Antibody (routine testing w rflx)     Hepatitis B surface antigen     Hepatitis C antibody      Urinalysis, Routine w reflex microscopic     CEA     AFP tumor marker     hCG, serum, qualitative     Lactate dehydrogenase     C-reactive protein     CA 125     Comprehensive metabolic panel     Protime-INR     APTT     CBC with Differential No orders of the defined types were placed in this encounter.   Imaging Orders     CT ABDOMEN PELVIS W CONTRAST  MDM High  Assessment and Plan  #Abdominal Ascites Patient presenting reporting enlarging abdomen and positive pregnancy test previously, however on bedside ultrasound no pregnancy identified a large volume ascites seen throughout the abdomen.  Was concerning for malignancy given her presentation, possibly hormone secreting tumor given positive pregnancy test previously.  Will evaluate with abdominal CT with contrast and also draw basic labs and tumor markers. 5:27 PM  Labs so far mostly unrevealing.  MP abnormal with sodium of 130, potassium 5.7, creatinine 1.05, normal LFTs and bili.  Coags are normal, LDH mildly elevated at 447, HIV/hep C/hep B are all negative.  hCG negative as well, tumor marker still pending.  Patient has completed CT abdomen pelvis with contrast however final read is not available.  On my review of the images large volume ascites is confirmed, possible mass arising from right adnexa but difficult to interpret.  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  Care signed out to Hansel Feinstein, CNM with likely plan to admit to an oncology service at [facility] pending final read of CT scan. 10:22 PM   Clarnce Flock    Admit to GYN. I d/w Dr. Delsa Sale and plan is to call clinic in AM and have Gyn Onc see patient for consult. I told patient to be NPO after midnight in case for any procedures tomorrow  Repeat BMP and additional labs ordered.  Pulmonary eLink called to go over pleural effusion findings and tachycardia. Pt is satting  fine on RA, normal breathing and talking and no chest pain or discomfort. ECG and continuous pulse ox ordered and eLink MD doesn't feell need to do anything additional overnight or any need to rule out PE.   Durene Romans MD Attending [facility] for [place] [facility]ulty [facility]) GYN Consult Phone: [phone] (M-F, 0800-1700) & [phone] (Off hours, weekends, holidays) Time: 2300

## 2020-04-21 NOTE — MAU Note (Signed)
Pt reports she is Due 06/16/2020. No prenatal care. Started having abd pain and tighting about a week ago. C/O SOB that stared 3 days ago. Good fetal movement reported. Denies any vag bleeding or leaking.

## 2020-04-22 ENCOUNTER — Inpatient Hospital Stay (HOSPITAL_COMMUNITY): Payer: Medicaid Other

## 2020-04-22 ENCOUNTER — Other Ambulatory Visit (HOSPITAL_COMMUNITY): Payer: Self-pay | Admitting: Physician Assistant

## 2020-04-22 ENCOUNTER — Other Ambulatory Visit: Payer: Self-pay | Admitting: Gynecologic Oncology

## 2020-04-22 ENCOUNTER — Other Ambulatory Visit: Payer: Self-pay

## 2020-04-22 DIAGNOSIS — R1900 Intra-abdominal and pelvic swelling, mass and lump, unspecified site: Secondary | ICD-10-CM

## 2020-04-22 DIAGNOSIS — Z9889 Other specified postprocedural states: Secondary | ICD-10-CM | POA: Diagnosis not present

## 2020-04-22 DIAGNOSIS — N9489 Other specified conditions associated with female genital organs and menstrual cycle: Secondary | ICD-10-CM

## 2020-04-22 HISTORY — PX: IR PARACENTESIS: IMG2679

## 2020-04-22 LAB — BASIC METABOLIC PANEL
Anion gap: 14 (ref 5–15)
BUN: 8 mg/dL (ref 6–20)
CO2: 23 mmol/L (ref 22–32)
Calcium: 9.1 mg/dL (ref 8.9–10.3)
Chloride: 97 mmol/L — ABNORMAL LOW (ref 98–111)
Creatinine, Ser: 0.99 mg/dL (ref 0.44–1.00)
GFR, Estimated: >60 mL/min (ref >60)
Glucose, Bld: 103 mg/dL — ABNORMAL HIGH (ref 70–99)
Potassium: 4.3 mmol/L (ref 3.5–5.1)
Sodium: 134 mmol/L — ABNORMAL LOW (ref 135–145)

## 2020-04-22 LAB — CA 125: Cancer Antigen (CA) 125: 979 U/mL — ABNORMAL HIGH (ref 0.0–38.1)

## 2020-04-22 LAB — CEA: CEA: 1.5 ng/mL (ref 0.0–4.7)

## 2020-04-22 LAB — RUBELLA SCREEN: Rubella: 1.09 index (ref >0.99)

## 2020-04-22 LAB — AFP TUMOR MARKER: AFP, Serum, Tumor Marker: 6 ng/mL (ref 0.0–8.3)

## 2020-04-22 LAB — RPR: RPR Ser Ql: NONREACTIVE

## 2020-04-22 MED ORDER — LIDOCAINE HCL (PF) 1 % IJ SOLN
INTRAMUSCULAR | Status: DC | PRN
  Administered 2020-04-22: 10 mL

## 2020-04-22 MED ORDER — LIDOCAINE HCL 1 % IJ SOLN
INTRAMUSCULAR | Status: AC
  Filled 2020-04-22: qty 20

## 2020-04-22 NOTE — Plan of Care (Signed)

## 2020-04-22 NOTE — Progress Notes (Signed)
This pt surgery was added on today for 04-27-2020 for dr Berline Lopes.  Reviewed pt chart and had anesthesia, Konrad Felix PA, to review if pt candidate for [facility].  Janett Billow stated due to pt having a large pleural effusion pt surgery would be best done at main OR. Sent secure chat to Joylene John NP (NP for dr Berline Lopes at cancer center) informed her anesthesia decision.

## 2020-04-22 NOTE — Progress Notes (Signed)
Received Pt back to unit on a bed, alert and oriented x4, not in distress, VS taken and recorded. Will continue to monitor.

## 2020-04-22 NOTE — Progress Notes (Addendum)
Gynecology Progress Note  Admission Date: 04/21/2020 Current Date: 04/22/2020 10:02 AM  Brandy Knight is a 30 y.o. 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  History complicated by: Patient Active Problem List   Diagnosis Date Noted  . Ascites 04/21/2020  . Routine postpartum follow-up 11/06/2013  . 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 Denies nausea/vomiting. Currently NPO. Minimal pain. Denies shortness of breath or difficulty breathing. Resting comfortably in bed.  Objective:   Vitals:   04/22/20 0010 04/22/20 0052 04/22/20 0340 04/22/20 0744  BP:  105/73 100/63 97/68  Pulse:  (!) 101 99 93  Resp:  16 16 14   Temp:  98 F (36.7 C) 97.9 F (36.6 C) 97.9 F (36.6 C)  TempSrc:  Oral Oral Oral  SpO2: 96% 98% 96% 96%  Weight:      Height:        No intake/output data recorded. No intake or output data in the 24 hours ending 04/22/20 1002   Physical exam: BP 97/68   Pulse 93   Temp 97.9 F (36.6 C) (Oral)   Resp 14   Ht 5\' 2"  (1.575 m)   Wt 77.1 kg   SpO2 96%   Breastfeeding Unknown   BMI 31.09 kg/m  CONSTITUTIONAL: Well-developed, well-nourished female in no acute distress.  HENT:  Normocephalic, atraumatic, External right and left ear normal. Oropharynx is clear and moist EYES: Conjunctivae and EOM are normal. Pupils are equal, round, and reactive to light. No scleral icterus.  NECK: Normal range of motion, supple, no masses.  Normal thyroid.  SKIN: Skin is warm and dry. No rash noted. Not diaphoretic. No erythema. No pallor. NEUROLOGIC: Alert and oriented to person, place, and time. Normal reflexes, muscle tone coordination. No cranial nerve deficit noted. PSYCHIATRIC: Normal mood and affect. Normal behavior. Normal judgment and thought content. CARDIOVASCULAR: Normal heart rate noted RESPIRATORY: Effort  normal, no problems with respiration noted. ABDOMEN: Soft, massively distended  PELVIC: deferred MUSCULOSKELETAL: Normal range of motion. No tenderness.  No cyanosis, clubbing, or edema.    UOP: voiding spontaneously  Labs   CBC Latest Ref Rng & Units 04/21/2020 09/17/2013 09/16/2013  WBC 4.0 - 10.5 K/uL 6.7 22.3(H) 22.3(H)  Hemoglobin 12.0 - 15.0 g/dL 15.1(H) 10.9(L) 11.0(L)  Hematocrit 36.0 - 46.0 % 48.5(H) 32.4(L) 33.3(L)  Platelets 150 - 400 K/uL 211 165 176   CMP Latest Ref Rng & Units 04/22/2020 04/21/2020  Glucose 70 - 99 mg/dL 103(H) 116(H)  BUN 6 - 20 mg/dL 8 9  Creatinine 0.44 - 1.00 mg/dL 0.99 1.05(H)  Sodium 135 - 145 mmol/L 134(L) 130(L)  Potassium 3.5 - 5.1 mmol/L 4.3 5.7(H)  Chloride 98 - 111 mmol/L 97(L) 95(L)  CO2 22 - 32 mmol/L 23 22  Calcium 8.9 - 10.3 mg/dL 9.1 8.8(L)  Total Protein 6.5 - 8.1 g/dL - 7.0  Total Bilirubin 0.3 - 1.2 mg/dL - 0.9  Alkaline Phos 38 - 126 U/L - 50  AST 15 - 41 U/L - 35  ALT 0 - 44 U/L - 21     Assessment & Plan:   Patient is 30 y.o. G1P1001 HD#2 admitted for large right adnexal mass with massive ascites, right pleural effusion. Admitted for management, reviewed case with Dr. Berline Lopes of [facility], who recommends paracentesis for patient comfort then discharge as long as she is stable.   IR consult for paracentesis  today NPO Possible discharge to home as long as she remains stable   K. Arvilla Meres, MD, [place] for [facility])   Reviewed with IR, they recommend paracentesis today, thoracentesis tomorrow. So ordered.   Feliz Beam, MD, [place] for [facility])

## 2020-04-22 NOTE — Procedures (Signed)
PROCEDURE SUMMARY:  Successful US guided paracentesis from left abdomen.  Yielded 9.1 L of clear yellow fluid.  No immediate complications.  Pt tolerated well.   Specimen sent for labs.  EBL < 1 mL  Theresa Duty, NP 04/22/2020 1:02 PM

## 2020-04-23 ENCOUNTER — Encounter (HOSPITAL_COMMUNITY): Payer: Self-pay | Admitting: Obstetrics and Gynecology

## 2020-04-23 ENCOUNTER — Other Ambulatory Visit: Payer: Self-pay

## 2020-04-23 ENCOUNTER — Inpatient Hospital Stay (HOSPITAL_COMMUNITY): Payer: Medicaid Other

## 2020-04-23 DIAGNOSIS — D3911 Neoplasm of uncertain behavior of right ovary: Secondary | ICD-10-CM

## 2020-04-23 DIAGNOSIS — Z9889 Other specified postprocedural states: Secondary | ICD-10-CM

## 2020-04-23 LAB — URINE CULTURE: Culture: 10000 — AB

## 2020-04-23 LAB — ESTRADIOL: Estradiol: 656 pg/mL

## 2020-04-23 LAB — TESTOSTERONE: Testosterone: 83 ng/dL — ABNORMAL HIGH (ref 8–60)

## 2020-04-23 MED ORDER — IBUPROFEN 600 MG PO TABS: 600.0000 mg | ORAL_TABLET | Freq: Four times a day (QID) | ORAL | 1 refills | Status: DC | PRN

## 2020-04-23 MED ORDER — LIDOCAINE HCL (PF) 1 % IJ SOLN
INTRAMUSCULAR | Status: AC
  Filled 2020-04-23: qty 30

## 2020-04-23 NOTE — Progress Notes (Addendum)
Provided discharge education/instructions, all questions and concerns addressed, Pt not in distress. Pt denies dizziness, no lightheadedness, no chest pain or palpitations, no SOB. She stated she feels so much better. Pt to discharge home with belongings.  At 18:15 Pt not in distress, Pt states she feels fine. Discharged home with belongings accompanied by significant other.

## 2020-04-23 NOTE — Progress Notes (Signed)
   04/23/20 0851  Vitals  Temp 98.2 F (36.8 C)  Temp Source Oral  BP 97/68  MAP (mmHg) 77  BP Location Left Arm  BP Method Automatic  Patient Position (if appropriate) Lying  Pulse Rate (!) 103  Pulse Rate Source Dinamap  Resp 17  Level of Consciousness  Level of Consciousness Alert  MEWS COLOR  MEWS Score Color Yellow  Oxygen Therapy  SpO2 99 %  O2 Device Room Air  Pain Assessment  Pain Scale 0-10  Pain Score 0  PCA/Epidural/Spinal Assessment  Respiratory Pattern Regular;Unlabored;Symmetrical  MEWS Score  MEWS Temp 0  MEWS Systolic 1  MEWS Pulse 1  MEWS RR 0  MEWS LOC 0  MEWS Score 2  Provider Notification  Provider Name/Title Dr. Ilda Basset  Date Provider Notified 04/23/20  Time Provider Notified [phone]  Notification Type  (EPIC secure chat)  Notification Reason Other (Comment) (HR slightly elevated, BP soft)  Rapid Response Notification  Name of Rapid Response RN Notified n/a    Pt alert and oriented x 4, not in distress, denies chest pain, no SOB, no palpitations, no dizziness, lightheadedness and headache. Pt states she feels fine. Dr. Harolyn Rutherford notified and rounded on Pt, no new orders received. Agricultural consultant notified. Will continue to monitor.

## 2020-04-23 NOTE — Discharge Instructions (Signed)
Thoracentesis, Care After This sheet gives you information about how to care for yourself after your procedure. Your health care provider may also give you more specific instructions. If you have problems or questions, contact your health care provider. What can I expect after the procedure? After your procedure, it is common to have some pain at the site where the needle was inserted (puncture site). Follow these instructions at home: Care of the puncture site  Follow instructions from your health care provider about how to take care of your puncture site. Make sure you: ? Wash your hands with soap and water before you change your bandage (dressing). If soap and water are not available, use hand sanitizer. ? Change your dressing as told by your health care provider.  Check the puncture site every day for signs of infection. Check for: ? Redness, swelling, or pain. ? Fluid or blood. ? Warmth. ? Pus or a bad smell.  Do not take baths, swim, or use a hot tub until your health care provider approves. General instructions  Take over-the-counter and prescription medicines only as told by your health care provider.  Do not drive for 24 hours if you were given a medicine to help you relax (sedative) during your procedure.  Drink enough fluid to keep your urine pale yellow.  You may return to your normal diet and normal activities as told by your health care provider.  Keep all follow-up visits as told by your health care provider. This is important.   Contact a health care provider if you:  Have redness, swelling, or pain at your puncture site.  Have fluid or blood coming from your puncture site.  Notice that your puncture site feels warm to the touch.  Have pus or a bad smell coming from your puncture site.  Have a fever.  Have chills.  Have nausea or vomiting.  Have trouble breathing.  Develop a worsening cough. Get help right away if you:  Have extreme shortness of  breath.  Develop chest pain.  Faint or feel light-headed. Summary  After your procedure, it is common to have some pain at the site where the needle was inserted (puncture site).  Wash your hands with soap and water before you change your bandage (dressing).  Check your puncture site every day for signs of infection.  Take over-the-counter and prescription medicines only as told by your health care provider. This information is not intended to replace advice given to you by your health care provider. Make sure you discuss any questions you have with your health care provider. Document Revised: 11/26/2019 Document Reviewed: 11/26/2019 Elsevier Patient Education  2021 [employer]    Paracentesis, Care After This sheet gives you information about how to care for yourself after your procedure. Your health care provider may also give you more specific instructions. If you have problems or questions, contact your health care provider. What can I expect after the procedure? After the procedure, it is common to have a small amount of clear fluid coming from the puncture site. Follow these instructions at home: Puncture site care  Follow instructions from your health care provider about how to take care of your puncture site. Make sure you: ? Wash your hands with soap and water before and after you change your bandage (dressing). If soap and water are not available, use hand sanitizer. ? Change your dressing as told by your health care provider.  Check your puncture area every day for signs of infection.   Check for: ? Redness, swelling, or pain. ? More fluid or blood. ? Warmth. ? Pus or a bad smell.   General instructions  Return to your normal activities as told by your health care provider. Ask your health care provider what activities are safe for you.  Take over-the-counter and prescription medicines only as told by your health care provider.  Do not take baths, swim, or use a  hot tub until your health care provider approves. Ask your health care provider if you may take showers. You may only be allowed to take sponge baths.  Keep all follow-up visits as told by your health care provider. This is important. Contact a health care provider if:  You have redness, swelling, or pain at your puncture site.  You have more fluid or blood coming from your puncture site.  Your puncture site feels warm to the touch.  You have pus or a bad smell coming from your puncture site.  You have a fever. Get help right away if:  You have chest pain or shortness of breath.  You develop increasing pain, discomfort, or swelling in your abdomen.  You feel dizzy or light-headed or you faint. Summary  After the procedure, it is common to have a small amount of clear fluid coming from the puncture site.  Follow instructions from your health care provider about how to take care of your puncture site.  Check your puncture area every day signs of infection.  Keep all follow-up visits as told by your health care provider. This information is not intended to replace advice given to you by your health care provider. Make sure you discuss any questions you have with your health care provider. Document Revised: 10/07/2018 Document Reviewed: 01/14/2018 Elsevier Patient Education  2021 [employer].    Ovarian Tumors  Ovarian tumors are solid growths on one or both ovaries. The ovaries are the parts of the female reproductive system that produce eggs and female hormones (estrogen and progesterone). The ovaries are located on each side of the uterus. Ovarian tumors are different from ovarian cysts, which are filled with fluid. Tumors can be cancerous (malignant) or noncancerous (benign). All solid tumors should be checked by a health care provider to make sure that they are not cancerous. What are the causes? The exact cause of this condition is not known. What increases the  risk? This condition is more likely to develop in women who:  Are age 75 or older.  Have gone through menopause.  Have a personal or family history of endometrial, ovarian, colon, or breast cancer.  Are overweight or obese.  Carry the genes associated with breast and ovarian cancer (BRCA1 or BRCA2) or other gene mutations related to inherited family cancer syndromes, such as Lynch syndrome.  Have undergone some types of fertility treatment, including in vitro fertilization.  Have had hormone replacement therapy.  Became pregnant for the first time at age 29 or older or have never been pregnant. What are the signs or symptoms? Ovarian tumors may not cause any symptoms in the beginning. Benign tumors may never cause symptoms. Cancerous tumors may cause symptoms that are minor and resemble other health problems. Symptoms of cancerous ovarian tumors may include:  Bloating, swelling, a lump, or pain in the abdomen.  Unexplained weight loss.  Indigestion, increased gas, and constipation.  Pain or pressure in the back or in the area between the hip bones (pelvis).  Loss of appetite or feeling full more quickly when eating.  Frequent urination or pressure on your bladder.  Pain during sex.  Fatigue. How is this diagnosed? This condition may be diagnosed based on the results of:  Your medical history.  A physical exam to check your ovaries, uterus, vulva, cervix, vagina, bladder, rectum, and fallopian tubes.  Tests. These may include: ? Imaging procedures such as an ultrasound, X-ray, CT scan, PET scan, or MRI. ? A biopsy. This is a test in which a small piece of tumor tissue is removed and examined under a microscope. ? Paracentesis. This is a procedure to remove built-up fluid from the abdomen (ascites). The fluid is then examined under a microscope. ? Laparoscopy. This is a procedure in which a thin, lighted tube is inserted into a small incision in the lower abdomen to take  images of your pelvic organs. ? Blood tests that may include genetic testing. How is this treated? If you have a benign tumor, the most common treatment is surgery to remove the tumor. If you have a cancerous tumor, treatment may include one or more of the following:  Surgery to remove the tumor and ovary. In some cases, surgery may involve removing both ovaries, the fallopian tubes, uterus, cervix, and surrounding lymph nodes to check whether cancer has spread.  Chemotherapy. This uses medicines to kill cancer cells. Intravenous (IV) or intraperitoneal (IP) chemotherapy is given directly in the abdomen.  Hormone therapy. This uses hormones or hormone-blocking medicines to fight cancer cells.  Targeted therapy. This uses drugs to attack specific areas within cancer cells to kill them or stop them from growing. It does not affect normal cells.  Immunotherapy or biotherapy. This uses medicines to help your immune system fight cancer cells. Follow these instructions at home: Medicines  Take over-the-counter and prescription medicines only as told by your health care provider.  Ask your health care provider if the medicine prescribed to you: ? Requires you to avoid driving or using machinery. ? Can cause constipation. You may need to take these actions to prevent or treat constipation:  Drink enough fluid to keep your urine pale yellow.  Take over-the-counter or prescription medicines.  Eat foods that are high in fiber, such as beans, whole grains, and fresh fruits and vegetables.  Limit foods that are high in fat and processed sugars, such as fried or sweet foods. Lifestyle  Do not use any products that contain nicotine or tobacco, such as cigarettes, e-cigarettes, and chewing tobacco. If you need help quitting, ask your health care provider.  Do moderate exercise regularly as told by your health care provider.  Try to eat regular, healthy meals. ? Avoid or eat limited amounts of  processed meats, sugary drinks, and highly processed foods. ? Some of your treatments might affect your appetite. If you are having problems with eating or your appetite, ask to meet with a dietitian. General instructions  Return to your normal activities as told by your health care provider. Ask your health care provider what activities are safe for you.  You may need to have regular blood tests and imaging tests to monitor your response to treatment.  Keep all follow-up visits. This is important. Contact a health care provider if you:  Are not able to follow a prescribed treatment plan or take a medicine.  Have any symptoms or changes that concern you.  Feel generally ill.  Have changes in your bowel or bladder habits. Get help right away if you have:  A fever or chills. This is important.  Serious side effects or an allergic reaction to a treatment or medicine.  Increased pain, swelling, or bloating in your abdomen.  New or sudden symptoms that do not go away. Summary  Ovarian tumors are solid growths on the organs that produce eggs and the hormones estrogen and progesterone in women (ovaries). The ovaries are located on each side of the uterus.  Ovarian tumors may not cause any symptoms in the beginning. Noncancerous (benign) tumors may never cause symptoms. Cancerous (malignant) tumors may cause symptoms that are minor and resemble other health problems.  If you have a benign tumor, the most common treatment is surgery to remove the tumor.  A cancerous tumor may be treated with surgery. Chemotherapy, targeted therapy, hormonal therapy, or a combination of these therapies may also be recommended.  Keep all follow-up visits. This is important. This information is not intended to replace advice given to you by your health care provider. Make sure you discuss any questions you have with your health care provider. Document Revised: 08/25/2019 Document Reviewed:  08/25/2019 Elsevier Patient Education  2021 [employer]  

## 2020-04-23 NOTE — Discharge Summary (Signed)
Physician Discharge Summary  Patient ID: Brandy Knight MRN: [id number] DOB/AGE: Feb 01, 1991 30 y.o.  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 G1P1001 who presented to the [facility] ER with report of having contractions and being about 7-8 months pregnant. Also reported SOB.  She was transferred to [facility]U, but upon initial evaluation no FHR was detected on doppler.  On bedside ultrasound, no pregnancy identified but large volume ascites seen throughout the abdomen. Follow up CT imaging showed large volume ascites, large right pleural effusion, and a 15 cm right ovarian tumor.  Gynecology Oncology was consulted given concern about possible malignancy, also consulted about surgical management. The leading diagnosis was of possible Meigs' syndrome given the large tumor, ascites and pleural effusion.  Interventional Radiology was also consulted for paracentesis and thoracentesis; these procedures were subsequently performed.  On 04/22/20, paracentesis yielded 9.1 liters of fluid and thoracentesis on 04/23/20 yielded 2.1 liters of fluid.  Patient felt significantly better after both procedures and was stable.  As per the recommendations of Dr. Jeral Pinch (Gynecology Oncology), patient was discharged to home. She is already scheduled to see Dr. Berline Lopes on 04/26/20 with plan for surgery the next day.  Patient was sent home in stable conditions, given precautions to return to [facility] ER with any worsening  symptoms.   Significant Diagnostic Studies:  Recent Results (from the past 2160 hour(s))  CBC     Status: Abnormal   Collection Time: 04/21/20  3:57 PM  Result Value Ref Range   WBC 6.7 4.0 - 10.5 K/uL   RBC 6.13 (H) 3.87 - 5.11 MIL/uL   Hemoglobin 15.1 (H) 12.0 - 15.0 g/dL   HCT 48.5 (H) 36.0 - 46.0 %   MCV 79.1 (L) 80.0 - 100.0 fL   MCH 24.6 (L) 26.0 - 34.0 pg   MCHC 31.1 30.0 - 36.0 g/dL   RDW 14.5 11.5 - 15.5 %   Platelets 211 150 - 400 K/uL    Comment: REPEATED TO VERIFY   nRBC 0.0 0.0 - 0.2 %    Comment: Performed at [facility], [place] [address] [place], [place] [address]  RPR     Status: None   Collection Time: 04/21/20  3:57 PM  Result Value Ref Range   RPR Ser Ql NON REACTIVE NON REACTIVE    Comment: Performed at [facility], [place] [address], [place], [place] [address]  HIV Antibody (routine testing w rflx)     Status: None   Collection Time: 04/21/20  3:57 PM  Result Value Ref Range   HIV Screen 4th Generation wRfx Non Reactive Non Reactive    Comment: Performed at [facility], [place] [address] [place], [place] [address]  Rubella screen     Status: None   Collection Time: 04/21/20  3:57 PM  Result Value Ref Range   Rubella 1.09 Immune >0.99 index    Comment: (NOTE)  Non-immune       <0.90                                Equivocal  0.90 - 0.99                                Immune           >0.99 Performed At: [facility] [place], [place] [id number] Rush Farmer MD [id number]   Hepatitis B surface antigen     Status: None   Collection Time: 04/21/20  3:57 PM  Result Value Ref Range   Hepatitis B Surface Ag NON REACTIVE NON REACTIVE    Comment: Performed at [place] [address] [place], [place] [address]  Hemoglobin A1c     Status: None   Collection Time: 04/21/20  3:57 PM  Result Value Ref Range   Hgb A1c MFr Bld 5.5 4.8 - 5.6 %    Comment: (NOTE) Pre diabetes:           5.7%-6.4%  Diabetes:              >6.4%  Glycemic control for   <7.0% adults with diabetes    Mean Plasma Glucose 111.15 mg/dL    Comment: Performed at [facility] [address], [place], [place] [address]  Hepatitis C antibody     Status: None   Collection Time: 04/21/20  3:57 PM  Result Value Ref Range   HCV Ab NON REACTIVE NON REACTIVE    Comment: (NOTE) Nonreactive HCV antibody screen is consistent with no HCV infections,  unless recent infection is suspected or other evidence exists to indicate HCV infection.  Performed at [facility], [place] [address] [place], [place] [address]   Type and screen [place]     Status: None   Collection Time: 04/21/20  4:05 PM  Result Value Ref Range   ABO/RH(D) AB POS    Antibody Screen NEG    Sample Expiration      04/24/2020,2359 Performed at [facility], [place] [address], [place], [place] [address]   Urinalysis, Routine w reflex microscopic     Status: Abnormal   Collection Time: 04/21/20  4:55 PM  Result Value Ref Range   Color, Urine AMBER (A) YELLOW    Comment: BIOCHEMICALS MAY BE AFFECTED BY COLOR   APPearance CLOUDY (A) CLEAR   Specific Gravity, Urine 1.029 1.005 - 1.030   pH 5.0 5.0 - 8.0   Glucose, UA NEGATIVE NEGATIVE mg/dL   Hgb urine dipstick NEGATIVE NEGATIVE   Bilirubin Urine SMALL (A) NEGATIVE   Ketones, ur 5 (A) NEGATIVE mg/dL   Protein, ur 100 (A) NEGATIVE mg/dL   Nitrite NEGATIVE NEGATIVE   Leukocytes,Ua MODERATE (A) NEGATIVE   RBC / HPF 0-5 0 - 5 RBC/hpf   WBC, UA 11-20 0 - 5 WBC/hpf   Bacteria, UA FEW (A) NONE SEEN   Squamous Epithelial / LPF 11-20 0 - 5   Mucus PRESENT     Comment: Performed at [facility], [address], [place],  [address]  Urine Culture     Status: Abnormal   Collection Time: 04/21/20  4:55 PM   Specimen: Urine, Random  Result Value Ref Range   Specimen Description URINE, RANDOM  Special Requests NONE    Culture (A)      10,000 COLONIES/mL STREPTOCOCCUS AGALACTIAE TESTING AGAINST S. AGALACTIAE NOT ROUTINELY PERFORMED DUE TO PREDICTABILITY OF AMP/PEN/VAN SUSCEPTIBILITY. Performed at [facility], [place] [address], [place], [place] [address]    Report Status 04/23/2020 FINAL   CEA     Status: None   Collection Time: 04/21/20  4:55 PM  Result Value Ref Range   CEA 1.5 0.0 - 4.7 ng/mL    Comment: (NOTE)                             Nonsmokers          <3.9                             Smokers             <5.6 Roche [employer] Electrochemiluminescence Immunoassay (ECLIA) Values obtained with different assay methods or kits cannot be used interchangeably.  Results cannot be interpreted as absolute evidence of the presence or absence of malignant disease. Performed At: [facility] [place], [place] [id number] Rush Farmer MD [id number]   AFP tumor marker     Status: None   Collection Time: 04/21/20  4:55 PM  Result Value Ref Range   AFP, Serum, Tumor Marker 6.0 0.0 - 8.3 ng/mL    Comment: (NOTE) [employer]ostics Electrochemiluminescence Immunoassay (ECLIA) Values obtained with different assay methods or kits cannot be used interchangeably.  Results cannot be interpreted as absolute evidence of the presence or absence of malignant disease. This test is not interpretable in pregnant females. Performed At: [facility] [address] [place], [place] [id number] Rush Farmer MD [id number]   hCG, serum, qualitative     Status: None   Collection Time: 04/21/20  4:55 PM  Result Value Ref Range   Preg, Serum NEGATIVE NEGATIVE    Comment:        THE SENSITIVITY OF THIS METHODOLOGY IS >10 mIU/mL. Performed at [facility], [place] [address] [place], [place] [address]   Lactate dehydrogenase     Status: Abnormal   Collection Time: 04/21/20  4:55 PM  Result Value Ref Range   LDH 447 (H) 98 - 192 U/L    Comment: Performed at [facility], [place] [address] [place],  [address]  C-reactive protein     Status: None   Collection Time: 04/21/20  4:55 PM  Result Value Ref Range   CRP <0.5 <1.0 mg/dL    Comment: Performed at [facility], [place] [address] [place], [place] [address]  CA 125     Status: Abnormal   Collection Time: 04/21/20  4:55 PM  Result Value Ref Range   Cancer Antigen (CA) 125 979.0 (H) 0.0 - 38.1 U/mL    Comment: (NOTE) Roche [employer]ostics Electrochemiluminescence Immunoassay (ECLIA) Values obtained with different assay methods or kits cannot be used interchangeably.  Results cannot be interpreted as absolute evidence of the presence or absence of malignant disease. Performed At: [facility] [place], [place] [id number] Rush Farmer MD [id number]   Comprehensive metabolic panel     Status: Abnormal   Collection Time: 04/21/20  4:55 PM  Result Value Ref Range   Sodium 130 (L) 135 - 145 mmol/L   Potassium 5.7 (H) 3.5 - 5.1 mmol/L    Comment:  HEMOLYSIS AT THIS LEVEL MAY AFFECT RESULT   Chloride 95 (L) 98 - 111 mmol/L   CO2 22 22 - 32 mmol/L   Glucose, Bld 116 (H) 70 - 99 mg/dL    Comment: Glucose reference range applies only to samples taken after fasting for at least 8 hours.   BUN 9 6 - 20 mg/dL   Creatinine, Ser 1.05 (H) 0.44 - 1.00 mg/dL   Calcium 8.8 (L) 8.9 - 10.3 mg/dL   Total Protein 7.0 6.5 - 8.1 g/dL   Albumin 3.0 (L) 3.5 - 5.0 g/dL   AST 35 15 - 41 U/L   ALT 21 0 - 44 U/L   Alkaline Phosphatase 50 38 - 126 U/L   Total Bilirubin 0.9 0.3 - 1.2 mg/dL   GFR, Estimated >60 >60 mL/min    Comment: (NOTE) Calculated using the CKD-EPI Creatinine Equation (2021)    Anion gap 13 5 - 15    Comment: Performed at [facility], [place] [address], [place], [place] [address]  hCG, quantitative, pregnancy     Status: None   Collection Time: 04/21/20  4:55 PM  Result Value Ref Range   hCG, Beta Chain, Quant, S <1 <5 mIU/mL    Comment:          GEST. AGE      CONC.   (mIU/mL)   <=1 WEEK        5 - 50     2 WEEKS       50 - 500     3 WEEKS       100 - 10,000     4 WEEKS     1,000 - 30,000     5 WEEKS     3,500 - 115,000   6-8 WEEKS     12,000 - 270,000    12 WEEKS     15,000 - 220,000        FEMALE AND NON-PREGNANT FEMALE:     LESS THAN 5 mIU/mL Performed at [facility], [place] [address], [place], [place] [address]   Protime-INR     Status: None   Collection Time: 04/21/20  6:08 PM  Result Value Ref Range   Prothrombin Time 14.7 11.4 - 15.2 seconds   INR 1.2 0.8 - 1.2    Comment: (NOTE) INR goal varies based on device and disease states. Performed at [facility], [place] [address] [place], [place] [address]   APTT     Status: None   Collection Time: 04/21/20  6:08 PM  Result Value Ref Range   aPTT 30 24 - 36 seconds    Comment: Performed at [facility] [address], [place], [place] [address]  SARS CORONAVIRUS 2 (TAT 6-24 HRS) Nasopharyngeal Nasopharyngeal Swab     Status: None   Collection Time: 04/21/20  6:18 PM   Specimen: Nasopharyngeal Swab  Result Value Ref Range   SARS Coronavirus 2 NEGATIVE NEGATIVE    Comment: (NOTE) SARS-CoV-2 target nucleic acids are NOT DETECTED.  The SARS-CoV-2 RNA is generally detectable in upper and lower respiratory specimens during the acute phase of infection. Negative results do not preclude SARS-CoV-2 infection, do not rule out co-infections with other pathogens, and should not be used as the sole basis for treatment or other patient management decisions. Negative results must be combined with clinical observations, patient history, and epidemiological information. The expected result is Negative.  Fact Sheet for Patients: SugarRoll.be  Fact  Sheet for Healthcare Providers: https://www.woods-mathews.com/  This test is not yet approved or cleared by the [place] FDA and  has been authorized for detection and/or diagnosis of  SARS-CoV-2 by FDA under an Emergency Use Authorization (EUA). This EUA will remain  in effect (meaning this test can be used) for the duration of the COVID-19 declaration under Se ction 564(b)(1) of the Act, 21 U.S.C. section 360bbb-3(b)(1), unless the authorization is terminated or revoked sooner.  Performed at [facility], [place] [address] [place], [place] [id number]   Basic metabolic panel     Status: Abnormal   Collection Time: 04/22/20  3:03 AM  Result Value Ref Range   Sodium 134 (L) 135 - 145 mmol/L   Potassium 4.3 3.5 - 5.1 mmol/L   Chloride 97 (L) 98 - 111 mmol/L   CO2 23 22 - 32 mmol/L   Glucose, Bld 103 (H) 70 - 99 mg/dL    Comment: Glucose reference range applies only to samples taken after fasting for at least 8 hours.   BUN 8 6 - 20 mg/dL   Creatinine, Ser 0.99 0.44 - 1.00 mg/dL   Calcium 9.1 8.9 - 10.3 mg/dL   GFR, Estimated >60 >60 mL/min    Comment: (NOTE) Calculated using the CKD-EPI Creatinine Equation (2021)    Anion gap 14 5 - 15    Comment: Performed at [place] [address] [place], [place] [address]  Estradiol     Status: None   Collection Time: 04/22/20  3:03 AM  Result Value Ref Range   Estradiol 656.0 pg/mL    Comment: (NOTE)                    Adult Female:                      Follicular phase   [id number] -   166.0                      Ovulation phase    85.8 -   498.0                      Luteal phase       43.8 -   211.0                      Postmenopausal     <6.0 -    54.7                    Pregnancy                      1st trimester     215.0 - >4300.0 Roche ECLIA methodology Performed At: [facility] [place], [place] [id number] Rush Farmer MD [id number]   Testosterone     Status: Abnormal   Collection Time: 04/22/20  3:03 AM  Result Value Ref Range   Testosterone 83 (H) 8 - 60 ng/dL    Comment: (NOTE) Performed At: [facility] [place], [place] [id number] Rush Farmer MD [id number]      DG Chest 1 View  Result Date: 04/23/2020 CLINICAL DATA:  Status post right thoracentesis. EXAM: CHEST  1 VIEW COMPARISON:  None. FINDINGS: The heart size and mediastinal contours are within normal limits. Left lung is clear. No pneumothorax is noted. Small pleural effusion is noted  along the lateral wall of the right lung base. The visualized skeletal structures are unremarkable. IMPRESSION: No pneumothorax seen status post right-sided thoracentesis. Small right pleural effusion is noted. Electronically Signed   By: Marijo Conception M.D.   On: 04/23/2020 12:29   CT ABDOMEN PELVIS W CONTRAST  Result Date: 04/21/2020 CLINICAL DATA:  Ascites 30 y/o presented thinking she was pregnant, no pregnancy seen on [place], massive ascites with abnormal appearance of uterus on bedside US EXAM: CT ABDOMEN AND PELVIS WITH CONTRAST TECHNIQUE: Multidetector CT imaging of the abdomen and pelvis was performed using the standard protocol following bolus administration of intravenous contrast. CONTRAST:  15mL OMNIPAQUE IOHEXOL 300 MG/ML  SOLN COMPARISON:  None. FINDINGS: Lower chest: Large right pleural effusion. Compressive atelectasis of the included right lung. There is leftward mediastinal shift. Hepatobiliary: Low-density adjacent to the falciform ligament is typical of focal fatty infiltration. There is no discrete focal liver lesion. The gallbladder is physiologically distended. There is no calcified gallstone. No intrahepatic biliary ductal dilatation. Common bile duct is not well seen. Pancreas: No ductal dilatation or inflammation. There is no evidence of pancreatic mass. Spleen: Normal in size. Cleft posteriorly. No focal splenic lesion. Adrenals/Urinary Tract: No adrenal nodule. No hydronephrosis. There is no perinephric edema. The urinary bladder appears completely decompressed. Stomach/Bowel: Bowel evaluation is limited in the absence of enteric contrast and presence of massive ascites.  Ascites causes central displacement of small-bowel loops. No small bowel distension. Normal appendix tentatively visualized, series 3, image 65. Majority of the colon is decompressed. There is no obvious colonic wall thickening. Vascular/Lymphatic: Normal caliber abdominal aorta. Patent portal vein. There is prominence of the left ovarian and gonadal vasculature. No evidence of adenopathy, allowing for limitations related to ascites. Reproductive: Heterogeneous enhancing large right adnexal mass measures 14.7 x 12.8 x 13.6 cm. Lobulated enhancing components peripherally with central low-density. No normal right ovary is seen. There is a clear fat plane between this lesion and the uterus. Normal left ovary is tentatively visualized superior to the uterus just to the left of midline. Cystic structure in the region of the cervix is likely a nabothian cyst. Other: Massive abdominopelvic ascites. Small amount ascites leaks into the umbilicus. There is generalized subcutaneous edema. No free air. There is no obvious omental thickening, however degree of ascites partially obscures. Musculoskeletal: There are no acute or suspicious osseous abnormalities. IMPRESSION: 1. 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 Ovarian lesion is typically a fibroma in this setting. There is a cleavage plane between the adnexal mass and the uterus, which makes a pedunculated fibroid unlikely. 2. There is a probable nabothian cyst in the cervix. 3. Ascites causes mass effect on the abdominopelvic structures. Electronically Signed   By: Keith Rake M.D.   On: 04/21/2020 21:59   IR Paracentesis  Result Date: 04/22/2020 INDICATION: Patient admitted for right adnexal/ovarian mass with massive abdominopelvic ascites concerning for Meig's syndrome. Interventional radiology asked to perform a therapeutic and diagnostic paracentesis.  EXAM: ULTRASOUND GUIDED PARACENTESIS MEDICATIONS: 1% lidocaine 15 mL COMPLICATIONS: None immediate. PROCEDURE: Informed written consent was obtained from the patient after a discussion of the risks, benefits and alternatives to treatment. A timeout was performed prior to the initiation of the procedure. Initial ultrasound scanning demonstrates a large amount of ascites within the left lower abdominal quadrant. The left lower abdomen was prepped and draped in the usual sterile fashion. 1% lidocaine was used for  local anesthesia. Following this, a 19 gauge, 7-cm, Yueh catheter was introduced. An ultrasound image was saved for documentation purposes. The paracentesis was performed. The catheter was removed and a dressing was applied. The patient tolerated the procedure well without immediate post procedural complication. FINDINGS: A total of approximately 9.1 L of clear yellow fluid was removed. Samples were sent to the laboratory as requested by the clinical team. IMPRESSION: Successful ultrasound-guided paracentesis yielding 9.1 liters of peritoneal fluid. Read by: Soyla Dryer, NP Electronically Signed   By: Sandi Mariscal M.D.   On: 04/22/2020 13:10   US THORACENTESIS ASP PLEURAL SPACE W/IMG GUIDE  Result Date: 04/23/2020 INDICATION: Shortness of breath. Large right pleural effusion. Request diagnostic and therapeutic thoracentesis. EXAM: ULTRASOUND GUIDED RIGHT THORACENTESIS MEDICATIONS: 1% plain lidocaine, 5 mL. COMPLICATIONS: None immediate. Postprocedural chest x-ray negative for pneumothorax. PROCEDURE: An ultrasound guided thoracentesis was thoroughly discussed with the patient and questions answered. The benefits, risks, alternatives and complications were also discussed. The patient understands and wishes to proceed with the procedure. Written consent was obtained. Ultrasound was performed to localize and mark an adequate pocket of fluid in the right chest. The area was then prepped and draped in the  normal sterile fashion. 1% Lidocaine was used for local anesthesia. Under ultrasound guidance a 6 Fr Safe-T-Centesis catheter was introduced. Thoracentesis was performed. The catheter was removed and a dressing applied. FINDINGS: A total of approximately 1.9 L of slightly hazy yellow fluid was removed. Samples were sent to the laboratory as requested by the clinical team. IMPRESSION: Successful ultrasound guided right thoracentesis yielding 1.9 L of pleural fluid. Read by: Ascencion Dike PA-C Electronically Signed   By: Markus Daft M.D.   On: 04/23/2020 13:13    Discharge Exam: Blood pressure 92/61, pulse (!) 112, temperature 98.2 F (36.8 C), temperature source Oral, resp. rate 16, height 5\' 2"  (1.575 m), weight 63.7 kg, SpO2 93 %, unknown if currently breastfeeding. CONSTITUTIONAL: Well-developed, well-nourished female in no acute distress.  NEUROLOGIC: Alert and oriented to person, place, and time.  No cranial nerve deficit noted. PSYCHIATRIC: Normal mood and affect. Normal behavior. Normal judgment and thought content. CARDIOVASCULAR: Normal heart rate noted RESPIRATORY: Effort normal, no problems with respiration noted. ABDOMEN: Soft, mildly-moderately distended, nontender PELVIC: deferred MUSCULOSKELETAL: Normal range of motion. No tenderness.  No cyanosis, clubbing, or edema.    Discharge disposition: 01-Home or Self Care      Allergies as of 04/23/2020   No Known Allergies     Medication List    STOP taking these medications   doxycycline 100 MG capsule Commonly known as: VIBRAMYCIN     TAKE these medications   ibuprofen 600 MG tablet Commonly known as: ADVIL Take 1 tablet (600 mg total) by mouth every 6 (six) hours as needed for headache, mild pain, moderate pain or cramping. What changed:   when to take this  reasons to take this       Discharge planning time: 25 minutes   Signed: Verita Schneiders, MD 04/23/2020, 4:44 PM

## 2020-04-23 NOTE — Progress Notes (Signed)
Received Pt in bed from ultrasound-post thoracentesis. Pt alert and oriented x4, not in distress, no drainage noted to dressing, vital signs taken and recorded. Pt states she feels a whole lot better than before. Will continue to monitor.

## 2020-04-23 NOTE — Procedures (Signed)
PROCEDURE SUMMARY:  Successful US guided right thoracentesis. Yielded 1.9 L of slightly hazy yellow fluid. Pt tolerated procedure well. No immediate complications.  Specimen was sent for labs. CXR ordered.  EBL < 5 mL  Ascencion Dike PA-C 04/23/2020 12:24 PM

## 2020-04-23 NOTE — Progress Notes (Signed)
Patient feels a lot better, stable vital signs. Plan is to discharge to home later today with follow up with GYN ONC as scheduled next week.   Verita Schneiders, MD

## 2020-04-23 NOTE — Plan of Care (Signed)

## 2020-04-23 NOTE — Progress Notes (Signed)
Gynecology Progress Note  Admission Date: 04/21/2020 Current Date: 04/23/2020 10:33 AM  AYRA HODGDON is a 30 y.o. 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  History complicated by: Patient Active Problem List   Diagnosis Date Noted  . Ascites 04/21/2020  . Routine postpartum follow-up 11/06/2013  . 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  Scheduled for thoracentesis today. Denies nausea/vomiting. Currently NPO. Minimal pain. Denies shortness of breath or difficulty breathing. Resting comfortably in bed.  Objective:   Vitals:   04/22/20 2004 04/23/20 0434 04/23/20 0435 04/23/20 0851  BP: (!) 89/59 97/71  97/68  Pulse: 94 98  (!) 103  Resp: 18 16  17   Temp: 98.3 F (36.8 C)  97.8 F (36.6 C) 98.2 F (36.8 C)  TempSrc: Oral  Oral Oral  SpO2: 94% 96%  99%  Weight:      Height:        No intake/output data recorded.  Intake/Output Summary (Last 24 hours) at 04/23/2020 1033 Last data filed at 04/22/2020 1150 Gross per 24 hour  Intake --  Output 9100 ml  Net -9100 ml  UOP: voiding spontaneously  Physical exam: BP 97/68 (BP Location: Left Arm)   Pulse (!) 103   Temp 98.2 F (36.8 C) (Oral)   Resp 17   Ht 5\' 2"  (1.575 m)   Wt 77.1 kg   SpO2 99%   Breastfeeding Unknown   BMI 31.09 kg/m  CONSTITUTIONAL: Well-developed, well-nourished female in no acute distress.  NEUROLOGIC: Alert and oriented to person, place, and time. Normal reflexes, muscle tone coordination. No cranial nerve deficit noted. PSYCHIATRIC: Normal mood and affect. Normal behavior. Normal judgment and thought content. CARDIOVASCULAR: Normal heart rate noted RESPIRATORY: Effort normal, no problems with respiration noted. ABDOMEN: Soft, moderately distended, nontender PELVIC: deferred MUSCULOSKELETAL: Normal range of  motion. No tenderness.  No cyanosis, clubbing, or edema.    Labs  CBC Latest Ref Rng & Units 04/21/2020 09/17/2013 09/16/2013  WBC 4.0 - 10.5 K/uL 6.7 22.3(H) 22.3(H)  Hemoglobin 12.0 - 15.0 g/dL 15.1(H) 10.9(L) 11.0(L)  Hematocrit 36.0 - 46.0 % 48.5(H) 32.4(L) 33.3(L)  Platelets 150 - 400 K/uL 211 165 176   CMP Latest Ref Rng & Units 04/22/2020 04/21/2020  Glucose 70 - 99 mg/dL 103(H) 116(H)  BUN 6 - 20 mg/dL 8 9  Creatinine 0.44 - 1.00 mg/dL 0.99 1.05(H)  Sodium 135 - 145 mmol/L 134(L) 130(L)  Potassium 3.5 - 5.1 mmol/L 4.3 5.7(H)  Chloride 98 - 111 mmol/L 97(L) 95(L)  CO2 22 - 32 mmol/L 23 22  Calcium 8.9 - 10.3 mg/dL 9.1 8.8(L)  Total Protein 6.5 - 8.1 g/dL - 7.0  Total Bilirubin 0.3 - 1.2 mg/dL - 0.9  Alkaline Phos 38 - 126 U/L - 50  AST 15 - 41 U/L - 35  ALT 0 - 44 U/L - 21   04/22/2020 Testosterone 83^, Estradiol 656^, Inhibin A and B pending 04/21/2020 CA-125 979.0^, LDH 447^, HCG <1, AFP 6, CEA 1.5  Assessment & Plan:   Patient is 30 y.o. G1P1001 HD#3 admitted for large right adnexal mass with massive ascites, right pleural effusion. Admitted for management, reviewed case with Dr. Berline Lopes of [facility], who recommends paracentesis and thoracentesis for patient comfort then discharge as long as she is stable.   IR performed paracentesis 1/14, will perform thoracentesis today. Appreciate their expertise.  NPO for now Possible discharge to home later as long as she remains stable     Verita Schneiders, MD, [place], [employer] for [employer], [place]

## 2020-04-25 ENCOUNTER — Other Ambulatory Visit: Payer: Self-pay

## 2020-04-25 ENCOUNTER — Encounter (HOSPITAL_COMMUNITY): Payer: Self-pay | Admitting: *Deleted

## 2020-04-25 ENCOUNTER — Telehealth: Payer: Self-pay | Admitting: *Deleted

## 2020-04-25 NOTE — Progress Notes (Addendum)
COVID Vaccine Completed:  No Date COVID Vaccine completed: COVID vaccine manufacturer: [place]   PCP -   No PCP Cardiologist - N/A  Chest x-ray - 04-23-20 in Epic EKG - N/A Stress Test -  ECHO -  Cardiac Cath -  Pacemaker/ICD device last checked:  Sleep Study -  CPAP -   Fasting Blood Sugar - N/A Checks Blood Sugar _____ times a day  Blood Thinner Instructions:  N/A Aspirin Instructions: Last Dose:  Anesthesia review: Recent pleural effusion.  Ascites due to pelvic mass.  Patient denies shortness of breath, fever, cough and chest pain at PAT appointment.  Pt able to climb a flight of stairs and perform housework without assistance.   Patient verbalized understanding of instructions that were given to them at the PAT appointment. Patient was also instructed that they will need to review over the PAT instructions again at home before surgery.

## 2020-04-26 ENCOUNTER — Other Ambulatory Visit (HOSPITAL_COMMUNITY)
Admission: RE | Admit: 2020-04-26 | Discharge: 2020-04-26 | Disposition: A | Discharge status: Home / Self Care | Payer: Medicaid Other | Source: Ambulatory Visit | Attending: Gynecologic Oncology | Admitting: Gynecologic Oncology

## 2020-04-26 ENCOUNTER — Other Ambulatory Visit: Payer: Self-pay

## 2020-04-26 ENCOUNTER — Other Ambulatory Visit (HOSPITAL_COMMUNITY): Payer: Self-pay

## 2020-04-26 ENCOUNTER — Encounter: Payer: Self-pay | Admitting: Gynecologic Oncology

## 2020-04-26 ENCOUNTER — Inpatient Hospital Stay: Payer: Medicaid Other | Attending: Gynecologic Oncology | Admitting: Gynecologic Oncology

## 2020-04-26 VITALS — BP 95/68 | HR 68 | Temp 97.5°F | Resp 18 | Ht 62.0 in | Wt 145.0 lb

## 2020-04-26 DIAGNOSIS — J90 Pleural effusion, not elsewhere classified: Secondary | ICD-10-CM | POA: Insufficient documentation

## 2020-04-26 DIAGNOSIS — Z01419 Encounter for gynecological examination (general) (routine) without abnormal findings: Secondary | ICD-10-CM

## 2020-04-26 DIAGNOSIS — R1900 Intra-abdominal and pelvic swelling, mass and lump, unspecified site: Secondary | ICD-10-CM | POA: Diagnosis present

## 2020-04-26 LAB — CYTOLOGY - NON PAP

## 2020-04-26 LAB — INHIBIN A: Inhibin-A: 496.2 pg/mL

## 2020-04-26 MED ORDER — OXYCODONE HCL 5 MG PO CAPS: 5.0000 mg | ORAL_CAPSULE | Freq: Four times a day (QID) | ORAL | 0 refills | Status: DC | PRN

## 2020-04-26 MED ORDER — SENNA 8.6 MG PO TABS: 1.0000 | ORAL_TABLET | Freq: Every day | ORAL | 0 refills | Status: DC | PRN

## 2020-04-26 MED ORDER — IBUPROFEN 800 MG PO TABS: 800.0000 mg | ORAL_TABLET | Freq: Three times a day (TID) | ORAL | 0 refills | Status: DC | PRN

## 2020-04-26 NOTE — Anesthesia Preprocedure Evaluation (Addendum)
Anesthesia Evaluation  Patient identified by MRN, date of birth, ID band Patient awake    Reviewed: Allergy & Precautions, NPO status , Patient's Chart, lab work & pertinent test results  History of Anesthesia Complications Negative for: history of anesthetic complications  Airway Mallampati: II  TM Distance: >3 FB Neck ROM: Full    Dental no notable dental hx.    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 S/P thoracentesis 1.9L on 04/23/20 and paracentesis 9L 04/22/20  Reproductive/Obstetrics                            Anesthesia Physical Anesthesia Plan  ASA: III  Anesthesia Plan: General   Post-op Pain Management:    Induction: Intravenous  PONV Risk Score and Plan: 3 and Treatment may vary due to age or medical condition, Ondansetron, Dexamethasone, Midazolam and Scopolamine patch - Pre-op  Airway Management Planned: Oral ETT  Additional Equipment: None  Intra-op Plan:   Post-operative Plan: Extubation in OR  Informed Consent: I have reviewed the patients History and Physical, chart, labs and discussed the procedure including the risks, benefits and alternatives for the proposed anesthesia with the patient or authorized representative who has indicated his/her understanding and acceptance.     Dental advisory given  Plan Discussed with: CRNA  Anesthesia Plan Comments: (- Small R Pleural Effusion, no current symptoms since thoracentesis on 1/15)      Anesthesia Quick Evaluation

## 2020-04-26 NOTE — Patient Instructions (Addendum)
Preparing for your Surgery  Plan for surgery on 04/26/20 with Dr. Berline Lopes at [facility] will be scheduled for a unilateral salpingo-oophorectomy, mini-laparotomy, possible fertility staging surgery, and any other indicated procedures.   I have sent your medications needed for after surgery to your preferred pharmacy.  Pre-operative Testing -You will receive a phone call from presurgical testing at [facility] if you have not received a call already to arrange for a pre-operative testing appointment before your surgery.  This appointment normally occurs one to two weeks before your scheduled surgery.   -Bring your insurance card, copy of an advanced directive if applicable, medication list  -At that visit, you will be asked to sign a consent for a possible blood transfusion in case a transfusion becomes necessary during surgery.  The need for a blood transfusion is rare but having consent is a necessary part of your care.     -You should not be taking blood thinners or aspirin at least ten days prior to surgery unless instructed by your surgeon.  -As part of our enhanced surgical recovery pathway, you may be advised to drink a carbohydrate drink the morning of surgery (at least 3 hours before). If you are diabetic, this will be substituted with G2 gatorade in order to prevent elevated glucose levels prior to surgery.  -Do not take supplements such as fish oil (omega 3), red yeast rice, tumeric before your surgery.  Day Before Surgery at [facility] will be asked to take in a light diet the day before surgery.  Avoid carbonated beverages.  You will be advised to have nothing to eat or drink after midnight the evening before.    Eat a light diet the day before surgery.  Examples including soups, broths, toast, yogurt, mashed potatoes.  Things to avoid include carbonated beverages (fizzy beverages), raw fruits and raw vegetables, or beans.   If your bowels are filled with gas,  your surgeon will have difficulty visualizing your pelvic organs which increases your surgical risks.  Your role in recovery Your role is to become active as soon as directed by your doctor, while still giving yourself time to heal.  Rest when you feel tired. You will be asked to do the following in order to speed your recovery:  - Cough and breathe deeply. This helps toclear and expand your lungs and can prevent pneumonia.  - Do mild physical activity. Walking or moving your legs help your circulation and body functions return to normal. A staff member will help you when you try to walk and will provide you with simple exercises. Do not try to get up or walk alone the first time. - Actively manage your pain. Managing your pain lets you move in comfort. We will ask you to rate your pain on a scale of zero to 10. It is your responsibility to tell your doctor or nurse where and how much you hurt so your pain can be treated.  Special Considerations -If you are diabetic, you may be placed on insulin after surgery to have closer control over your blood sugars to promote healing and recovery.  This does not mean that you will be discharged on insulin.  If applicable, your oral antidiabetics will be resumed when you are tolerating a solid diet.  -Your final pathology results from surgery should be available around one week after surgery and the results will be relayed to you when available.  -Dr. Lahoma Crocker is the surgeon that assists  your GYN Oncologist with surgery.  If you end up staying the night, the next day after your surgery you will either see Dr. Denman George or Dr. Lahoma Crocker.  -FMLA forms can be faxed to [phone] and please allow 5-7 business days for completion.  Pain Management After Surgery -You have been prescribed your pain medication and bowel regimen medications before surgery so that you can have these available when you are discharged from the hospital. The pain  medication is for use ONLY AFTER surgery and a new prescription will not be given.   -Make sure that you have Tylenol and Ibuprofen at home to use on a regular basis after surgery for pain control. We recommend alternating the medications every hour to six hours since they work differently and are processed in the body differently for pain relief.  -Review the attached handout on narcotic use and their risks and side effects.   Bowel Regimen -You have been prescribed Sennakot-S to take nightly to prevent constipation especially if you are taking the narcotic pain medication intermittently.  It is important to prevent constipation and drink adequate amounts of liquids.   Blood Transfusion Information WHAT IS A BLOOD TRANSFUSION? A transfusion is the replacement of blood or some of its parts. Blood is made up of multiple cells which provide different functions.  Red blood cells carry oxygen and are used for blood loss replacement.  White blood cells fight against infection.  Platelets control bleeding.  Plasma helps clot blood.  Other blood products are available for specialized needs, such as hemophilia or other clotting disorders. BEFORE THE TRANSFUSION  Who gives blood for transfusions?   You may be able to donate blood to be used at a later date on yourself (autologous donation).  Relatives can be asked to donate blood. This is generally not any safer than if you have received blood from a stranger. The same precautions are taken to ensure safety when a relative's blood is donated.  Healthy volunteers who are fully evaluated to make sure their blood is safe. This is blood bank blood. Transfusion therapy is the safest it has ever been in the practice of medicine. Before blood is taken from a donor, a complete history is taken to make sure that person has no history of diseases nor engages in risky social behavior (examples are intravenous drug use or sexual activity with multiple  partners). The donor's travel history is screened to minimize risk of transmitting infections, such as malaria. The donated blood is tested for signs of infectious diseases, such as HIV and hepatitis. The blood is then tested to be sure it is compatible with you in order to minimize the chance of a transfusion reaction. If you or a relative donates blood, this is often done in anticipation of surgery and is not appropriate for emergency situations. It takes many days to process the donated blood. RISKS AND COMPLICATIONS Although transfusion therapy is very safe and saves many lives, the main dangers of transfusion include:   Getting an infectious disease.  Developing a transfusion reaction. This is an allergic reaction to something in the blood you were given. Every precaution is taken to prevent this. The decision to have a blood transfusion has been considered carefully by your caregiver before blood is given. Blood is not given unless the benefits outweigh the risks.  AFTER SURGERY INSTRUCTIONS  Return to work: 4 weeks if applicable  Activity: 1. Be up and out of the bed during the day.  Take  a nap if needed.  You may walk up steps but be careful and use the hand rail.  Stair climbing will tire you more than you think, you may need to stop part way and rest.   2. No lifting or straining for 6 weeks over 10 pounds. No pushing, pulling, straining for 6 weeks.  3. No driving until you can brake safely. Usually this is about 5-10 days.  Do not drive if you are taking narcotic pain medicine and make sure that your reaction time has returned.   4. You can shower as soon as the next day after surgery. Shower daily.  Use your regular soap and water (not directly on the incision) and pat your incision(s) dry afterwards; don't rub.  No tub baths or submerging your body in water until cleared by your surgeon. If you have the soap that was given to you by pre-surgical testing that was used before surgery,  you do not need to use it afterwards because this can irritate your incisions.   5. No sexual activity and nothing in the vagina for 6 weeks.  6. You may experience a small amount of clear drainage from your incisions, which is normal.  If the drainage persists, increases, or changes color please call the office.  7. Do not use creams, lotions, or ointments such as neosporin on your incisions after surgery until advised by your surgeon because they can cause removal of the dermabond glue on your incisions.    8. You may experience vaginal spotting after surgery or around the 6-8 week mark from surgery when the stitches at the top of the vagina begin to dissolve.  The spotting is normal but if you experience heavy bleeding, call our office.  9. Take Tylenol or ibuprofen first for pain and only use narcotic pain medication for severe pain not relieved by the Tylenol or Ibuprofen.  Monitor your Tylenol intake to a max of 4,000 mg in a 24 hour period. You can alternate these medications after surgery.  Diet: 1. Low sodium Heart Healthy Diet is recommended but you are cleared to resume your normal (before surgery) diet after your procedure.  2. It is safe to use a laxative, such as Miralax or Colace, if you have difficulty moving your bowels. You have been prescribed Sennakot at bedtime every evening to keep bowel movements regular and to prevent constipation.    Wound Care: 1. Keep clean and dry.  Shower daily.  Reasons to call the Doctor:  Fever - Oral temperature greater than 100.4 degrees Fahrenheit  Foul-smelling vaginal discharge  Difficulty urinating  Nausea and vomiting  Increased pain at the site of the incision that is unrelieved with pain medicine.  Difficulty breathing with or without chest pain  New calf pain especially if only on one side  Sudden, continuing increased vaginal bleeding with or without clots.   Contacts: For questions or concerns you should  contact:  Dr. Berline Lopes at [phone]  Joylene John, NP at [phone]  After Hours: call [phone] and have the GYN Oncologist paged/contacted (after 5 pm or on the weekends).  Messages sent via mychart are for non-urgent matters and are not responded to after hours so for urgent needs, please call the after hours number.      IMPORTANT INFORMATION ABOUT YOUR OPIOID PRESCRIPTION    What are Opioids? How Can I Safely Take Opioids? Where Can I Get Help?  . Opioids are narcotic pain medications used for moderate-to-severe pain, often after  surgery or injury. . Evidence shows there may be better and safer options for long term pain from back pain, fibromyalgia, and nerve pain other than opioids. . Do not take more tablets or capsules than prescribed by your doctor or more often than prescribed. . Do not crush long-acting tablets. . Do not share or sell your prescription. . Store medication in a safe, secure, dry location away from pets, children, family, and visitors. . Naloxone, a medication that can reverse opioid-induced oversedation, can be purchased at some pharmacies and kept on hand for emergency use. IF YOU NEED HELP for substance abuse problems, talk with your primary care provider and contact a local resource: . [facility] (209) 055-2201 . [place] 310-081-9001 . CURE (Community United in Response to an Epidemic)     SaveSearches.co.nz . [facility] 623-153-3876 . [facility] Solution to the Opioid Problem)  781-021-2394 . [place] (404) [phone] . Narcotics Anonymous www.[place] 8577430335 . [place]  (724)554-4624 . [place] 1-800-662-HELP or visit https://findtreatment.[place].com.cy to find a treatment center near you . [facility] [phone] . [facility] 564-503-7977 Updated  10/29/2017  What are the Major  Risks of Opioid Use?    . Opioid use can have serious risk of addiction and overdose. . Overdose is often seen as slow breathing and can cause sudden death. . Other risks include tolerance (needing more for the same relief) and dependence (withdrawal when stopped).    .  How Do I Get Rid of Unused Opioids?   Marland Kitchen  Marland Kitchen Safely dispose of all opioids as soon as you no longer need them. . To dispose at home, mix the drug with an undesirable substance like coffee grounds or cat litter then throw them away. Marland Kitchen Opioids can be dropped off at local sheriff's offices or police departments. . If unable to safely dispose of them as above, opioids may be flushed.   What are Some Ways to Help Decrease Major Risk for Overdose?    Marland Kitchen Avoid alcohol, other opioids, sedating antihistamines (Benadryl, etc.) drugs for sleep/anxiety (Xanax, Ativan, Valium, Ambien, etc.), and muscle relaxants (Flexeril, Soma, Skelaxin, etc.).

## 2020-04-26 NOTE — Progress Notes (Signed)
GYNECOLOGIC ONCOLOGY NEW PATIENT CONSULTATION   Patient Name: Brandy Knight  Patient Age: 30 y.o. Date of Service: 04/26/20 Referring Provider: Dr. Vivien Rota  Primary Care Provider: Patient, No Pcp Per Consulting Provider: Jeral Pinch, MD   Assessment/Plan:  Premenopausal patient with approximately 7 months of abdominal symptoms and imaging findings discerning for Meigs syndrome.  Discussed CT findings as well as her recent lab work.  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  While it is more likely that the pelvic mass is benign, we also discussed the possibility of a malignant ovarian lesion.  Patient is currently scheduled for surgery tomorrow.  Plan will be for diagnostic laparoscopy, drainage of ascites, likely unilateral salpingo-oophorectomy with removal through mini laparotomy (we discussed the difficulty of mass removal given partially solid appearance).  I will plan to send the mass for frozen section.  If no borderline tumor or cancer identified, then no further surgery would be indicated.  If a malignancy is identified, then based on the type of malignancy, the patient would likely be a candidate for fertility sparing surgery.  We discussed that this may involve lymph node assessment, omentectomy, peritoneal biopsies, and any other indicated procedure.  As long as I am able to do the surgery through laparoscopic incisions and a mini laparotomy, we discussed that this will be an outpatient surgery.  Patient's mother will be bringing her tomorrow and will be able to stay with her for at least 24 hours after surgery.  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  We would only intervene if the patient became symptomatic from  this.  In terms of health maintenance, the patient is overdue for a Pap test.  Cotesting was performed today.  A copy of this note was sent to the patient's referring provider.   65 minutes of total time was spent for this patient encounter, including preparation, face-to-face counseling with the patient and coordination of care, and documentation of the encounter.   Jeral Pinch, MD  Division of Gynecologic Oncology  Department of Obstetrics and Gynecology  [facility]  ___________________________________________  Chief Complaint: Chief Complaint  Patient presents with  . ovarian mass    History of Present Illness:  Brandy Knight is a 30 y.o. y.o. female who is seen in consultation at the request of Dr. [name] for an evaluation of an adnexal mass, ascites, and right pleural effusion.  Patient notes she started having increasing abdominal girth in June.  About 2 months after that, she began noticing significantly faster increase in size to her abdomen.  Her last menstrual period she thinks was sometime in June.  She describes her menses as being irregular, sometimes coming more frequently than once a month.  She took a pregnancy test in December thinking that she could be pregnant and notes that this was positive.  As her abdomen increased in size, her her appetite slowly decreased and she developed early satiety as well as abdominal bloating.  Since her recent hospitalization, she denies any nausea or emesis.  She also notes becoming increasingly short of breath, feeling as though she would have a hard time taking a deep breath and catching her breath.  She reports normal bowel function  as well as urinary function although describes increased frequency.  Patient presented to the emergency department on the thirteenth with lower abdominal pain and cramping as well as shortness of breath.  She ultimately was hospitalized and underwent paracentesis with  approximately 9 L of fluid removed as well as thoracentesis with just under 2 L removed.  Cytology from this is still pending.  Patient lives in [place] with [place] son.  Her mother also lives in the area.  PAST MEDICAL HISTORY:  Past Medical History:  Diagnosis Date  . 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 Ascites   . Medical history non-contributory   . Pelvic mass      PAST SURGICAL HISTORY:  Past Surgical History:  Procedure Laterality Date  . IR PARACENTESIS  04/22/2020  . THORACENTESIS    . WISDOM TOOTH EXTRACTION      OB/GYN HISTORY:  OB History  Gravida Para Term Preterm AB Living  1 1 1     1   SAB IAB Ectopic Multiple Live Births          1    # Outcome Date GA Lbr Len/2nd Weight Sex Delivery Anes PTL Lv  1 Term 09/16/13 [redacted]w[redacted]d 07:37 / 00:38 6 lb 7.9 oz (2.945 kg) M Vag-Spont None  LIV    No LMP recorded. (Menstrual status: Irregular Periods).  Age at menarche: 20  Last pap: 2015 History of abnormal pap smears: no  SCREENING STUDIES:  Last mammogram: n/a  Last colonoscopy: n/a  MEDICATIONS: Outpatient Encounter Medications as of 04/26/2020  Medication Sig  . ibuprofen (ADVIL) 800 MG tablet Take 1 tablet (800 mg total) by mouth every 8 (eight) hours as needed.  Marland Kitchen oxycodone (OXY-IR) 5 MG capsule Take 1 capsule (5 mg total) by mouth every 6 (six) hours as needed for up to 10 doses.  Marland Kitchen senna (SENOKOT) 8.6 MG TABS tablet Take 1 tablet (8.6 mg total) by mouth daily as needed for up to 15 doses for mild constipation.  Marland Kitchen ibuprofen (ADVIL) 600 MG tablet Take 1 tablet (600 mg total) by mouth every 6 (six) hours as needed for headache, mild pain, moderate pain or cramping.   No facility-administered encounter medications on file as of 04/26/2020.    ALLERGIES:  No Known Allergies   FAMILY HISTORY:  Family History  Problem Relation Age of Onset  . Diabetes Mother   . Diabetes Father   . Seizures Sister   . Myasthenia gravis Sister   . ADD / ADHD  Sister   . Ovarian cancer Neg Hx   . Uterine cancer Neg Hx   . Breast cancer Neg Hx   . Colon cancer Neg Hx      SOCIAL HISTORY:  Social Connections: Not on file    REVIEW OF SYSTEMS:  + naseua, emesis Denies appetite changes, fevers, chills, fatigue, unexplained weight changes. Denies hearing loss, neck lumps or masses, mouth sores, ringing in ears or voice changes. Denies cough or wheezing.  Denies shortness of breath. Denies chest pain or palpitations. Denies leg swelling. Denies abdominal distention, pain, blood in stools, constipation, diarrhea. Denies pain with intercourse, dysuria, frequency, hematuria or incontinence. Denies hot flashes, pelvic pain, vaginal bleeding or vaginal discharge.   Denies joint pain, back pain or muscle pain/cramps. Denies itching, rash, or wounds. Denies dizziness, headaches, numbness or seizures. Denies swollen lymph nodes or glands, denies easy bruising or bleeding. Denies anxiety, depression, confusion, or decreased concentration.  Physical Exam:  Vital Signs for this encounter:  Blood  pressure 95/68, pulse 68, temperature (!) 97.5 F (36.4 C), temperature source Tympanic, resp. rate 18, height 5\' 2"  (1.575 m), weight 145 lb (65.8 kg), SpO2 96 %, unknown if currently breastfeeding. Body mass index is 26.52 kg/m. General: Alert, oriented, no acute distress.  HEENT: Normocephalic, atraumatic. Sclera anicteric.  Chest: Clear to auscultation bilaterally in upper lung fields, distant breath sounds in lower right lung. No wheezes, rhonchi, or rales. Cardiovascular: Regular rate and rhythm, no murmurs, rubs, or gallops.  Abdomen: Normoactive bowel sounds. Soft, moderately distended, nontender to palpation. Firm mass in mid to left abdomen, somewhat difficult to appreciate due to ascites. + palpable fluid wave.  Extremities: Grossly normal range of motion. Warm, well perfused. No edema bilaterally.  Skin: No rashes or lesions.  Lymphatics: No  cervical, supraclavicular, or inguinal adenopathy.  GU:  Normal external female genitalia. No lesions. Physiologic discharge, no bleeding.             Bladder/urethra:  No lesions or masses, well supported bladder             Vagina: well rugated, no lesions.             Cervix: Parous, transitional zone along outer aspect of the face of the cervix, no lesions. Pap and HPV testing collected.             Uterus: Small, mobile, no parametrial involvement or nodularity. Uterus with bulbous LUS, moves in conjunction with firm, heavy midline pelvic mass.             Adnexa: Described above.  Rectal: Deferred.  LABORATORY AND RADIOLOGIC DATA:  Outside medical records were reviewed to synthesize the above history, along with the history and physical obtained during the visit.   Lab Results  Component Value Date   WBC 6.7 04/21/2020   HGB 15.1 (H) 04/21/2020   HCT 48.5 (H) 04/21/2020   PLT 211 04/21/2020   GLUCOSE 103 (H) 04/22/2020   ALT 21 04/21/2020   AST 35 04/21/2020   NA 134 (L) 04/22/2020   K 4.3 04/22/2020   CL 97 (L) 04/22/2020   CREATININE 0.99 04/22/2020   BUN 8 04/22/2020   CO2 23 04/22/2020   INR 1.2 04/21/2020   HGBA1C 5.5 04/21/2020   Tumor markers: Testosterone  83 Inhibin A & B  pending Estradiol  656 hcg   <1 CA-125  979 LDH   447 AFP   6 CEA   1.5  CT A/P on 1/13: 1. 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 Ovarian lesion is typically a fibroma in this setting. There is a cleavage plane between the adnexal mass and the uterus, which makes a pedunculated fibroid unlikely. 2. There is a probable nabothian cyst in the cervix. 3. Ascites causes mass effect on the abdominopelvic structures.  CXR 1/15: No pneumothorax seen status post right-sided thoracentesis. Small right pleural effusion is noted.

## 2020-04-26 NOTE — Progress Notes (Addendum)
Anesthesia Chart Review   Case: [id number] Date/Time: 04/27/20 1245   Procedures:      XI ROBOTIC ASSISTED UNITLATERAL SALPINGO OOPHORECTOMY WITH POSSIBLE STAGING (N/A )     MINI LAPAROTOMY (N/A )   Anesthesia type: General   Pre-op diagnosis: PELVIC MASS, ASCITES   Location: [facility] ROOM 02 / [facility]   Surgeons: Lafonda Mosses, MD      DISCUSSION:30 y.o. with pelvic mass, ascites scheduled for above procedure 04/27/2020 with Dr. Jeral Pinch.   Pt presented to ED with lower abdominal cramping and pain, shortness of breath.  Had positive pregnancy test at home, no previous prenatal care.  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 S/p abdominal paracentesis of 9.1L on 04/22/20. S/p right thoracentesis of 1.9: on 04/23/20. Post procedure chest xray with small right pleural effusion noted.    Discussed with Dr. Smith Robert. Evaluate DOS. VS: Ht 5\' 2"  (1.575 m)   Wt 63.5 kg   BMI 25.61 kg/m   PROVIDERS: Patient, No Pcp Per   LABS: Labs reviewed: Acceptable for surgery. (all labs ordered are listed, but only abnormal results are displayed)  Labs Reviewed - No data to display   IMAGES:   EKG: 04/22/20 Rate 112 bpm  Sinus tachycardia Left posterior fascicular block T wave abnormality, consider lateral ischemia Abnormal ECG  CV:  Past Medical History:  Diagnosis Date  . 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 Ascites   . Medical history non-contributory   . Pelvic mass     Past Surgical History:  Procedure Laterality Date  . IR PARACENTESIS  04/22/2020  . THORACENTESIS    . WISDOM TOOTH EXTRACTION      MEDICATIONS: No current facility-administered medications for this encounter.   Marland Kitchen ibuprofen (ADVIL) 600 MG tablet  . ibuprofen (ADVIL) 800 MG tablet  . oxycodone (OXY-IR) 5 MG capsule  . senna (SENOKOT) 8.6 MG TABS tablet     Konrad Felix, PA-C [facility] [phone]

## 2020-04-26 NOTE — H&P (View-Only) (Signed)
GYNECOLOGIC ONCOLOGY NEW PATIENT CONSULTATION   Patient Name: Brandy Knight  Patient Age: 30 y.o. Date of Service: 04/26/20 Referring Provider: Dr. Vivien Rota  Primary Care Provider: Patient, No Pcp Per Consulting Provider: Jeral Pinch, MD   Assessment/Plan:  Premenopausal patient with approximately 7 months of abdominal symptoms and imaging findings discerning for Meigs syndrome.  Discussed CT findings as well as her recent lab work.  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  While it is more likely that the pelvic mass is benign, we also discussed the possibility of a malignant ovarian lesion.  Patient is currently scheduled for surgery tomorrow.  Plan will be for diagnostic laparoscopy, drainage of ascites, likely unilateral salpingo-oophorectomy with removal through mini laparotomy (we discussed the difficulty of mass removal given partially solid appearance).  I will plan to send the mass for frozen section.  If no borderline tumor or cancer identified, then no further surgery would be indicated.  If a malignancy is identified, then based on the type of malignancy, the patient would likely be a candidate for fertility sparing surgery.  We discussed that this may involve lymph node assessment, omentectomy, peritoneal biopsies, and any other indicated procedure.  As long as I am able to do the surgery through laparoscopic incisions and a mini laparotomy, we discussed that this will be an outpatient surgery.  Patient's mother will be bringing her tomorrow and will be able to stay with her for at least 24 hours after surgery.  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  We would only intervene if the patient became symptomatic from  this.  In terms of health maintenance, the patient is overdue for a Pap test.  Cotesting was performed today.  A copy of this note was sent to the patient's referring provider.   65 minutes of total time was spent for this patient encounter, including preparation, face-to-face counseling with the patient and coordination of care, and documentation of the encounter.   Jeral Pinch, MD  Division of Gynecologic Oncology  Department of Obstetrics and Gynecology  [facility]  ___________________________________________  Chief Complaint: Chief Complaint  Patient presents with  . ovarian mass    History of Present Illness:  Brandy Knight is a 30 y.o. y.o. female who is seen in consultation at the request of Dr. [name] for an evaluation of an adnexal mass, ascites, and right pleural effusion.  Patient notes she started having increasing abdominal girth in June.  About 2 months after that, she began noticing significantly faster increase in size to her abdomen.  Her last menstrual period she thinks was sometime in June.  She describes her menses as being irregular, sometimes coming more frequently than once a month.  She took a pregnancy test in December thinking that she could be pregnant and notes that this was positive.  As her abdomen increased in size, her her appetite slowly decreased and she developed early satiety as well as abdominal bloating.  Since her recent hospitalization, she denies any nausea or emesis.  She also notes becoming increasingly short of breath, feeling as though she would have a hard time taking a deep breath and catching her breath.  She reports normal bowel function  as well as urinary function although describes increased frequency.  Patient presented to the emergency department on the thirteenth with lower abdominal pain and cramping as well as shortness of breath.  She ultimately was hospitalized and underwent paracentesis with  approximately 9 L of fluid removed as well as thoracentesis with just under 2 L removed.  Cytology from this is still pending.  Patient lives in [place] with [place] son.  Her mother also lives in the area.  PAST MEDICAL HISTORY:  Past Medical History:  Diagnosis Date  . 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 Ascites   . Medical history non-contributory   . Pelvic mass      PAST SURGICAL HISTORY:  Past Surgical History:  Procedure Laterality Date  . IR PARACENTESIS  04/22/2020  . THORACENTESIS    . WISDOM TOOTH EXTRACTION      OB/GYN HISTORY:  OB History  Gravida Para Term Preterm AB Living  1 1 1     1   SAB IAB Ectopic Multiple Live Births          1    # Outcome Date GA Lbr Len/2nd Weight Sex Delivery Anes PTL Lv  1 Term 09/16/13 [redacted]w[redacted]d 07:37 / 00:38 6 lb 7.9 oz (2.945 kg) M Vag-Spont None  LIV    No LMP recorded. (Menstrual status: Irregular Periods).  Age at menarche: 20  Last pap: 2015 History of abnormal pap smears: no  SCREENING STUDIES:  Last mammogram: n/a  Last colonoscopy: n/a  MEDICATIONS: Outpatient Encounter Medications as of 04/26/2020  Medication Sig  . ibuprofen (ADVIL) 800 MG tablet Take 1 tablet (800 mg total) by mouth every 8 (eight) hours as needed.  Marland Kitchen oxycodone (OXY-IR) 5 MG capsule Take 1 capsule (5 mg total) by mouth every 6 (six) hours as needed for up to 10 doses.  Marland Kitchen senna (SENOKOT) 8.6 MG TABS tablet Take 1 tablet (8.6 mg total) by mouth daily as needed for up to 15 doses for mild constipation.  Marland Kitchen ibuprofen (ADVIL) 600 MG tablet Take 1 tablet (600 mg total) by mouth every 6 (six) hours as needed for headache, mild pain, moderate pain or cramping.   No facility-administered encounter medications on file as of 04/26/2020.    ALLERGIES:  No Known Allergies   FAMILY HISTORY:  Family History  Problem Relation Age of Onset  . Diabetes Mother   . Diabetes Father   . Seizures Sister   . Myasthenia gravis Sister   . ADD / ADHD  Sister   . Ovarian cancer Neg Hx   . Uterine cancer Neg Hx   . Breast cancer Neg Hx   . Colon cancer Neg Hx      SOCIAL HISTORY:  Social Connections: Not on file    REVIEW OF SYSTEMS:  + naseua, emesis Denies appetite changes, fevers, chills, fatigue, unexplained weight changes. Denies hearing loss, neck lumps or masses, mouth sores, ringing in ears or voice changes. Denies cough or wheezing.  Denies shortness of breath. Denies chest pain or palpitations. Denies leg swelling. Denies abdominal distention, pain, blood in stools, constipation, diarrhea. Denies pain with intercourse, dysuria, frequency, hematuria or incontinence. Denies hot flashes, pelvic pain, vaginal bleeding or vaginal discharge.   Denies joint pain, back pain or muscle pain/cramps. Denies itching, rash, or wounds. Denies dizziness, headaches, numbness or seizures. Denies swollen lymph nodes or glands, denies easy bruising or bleeding. Denies anxiety, depression, confusion, or decreased concentration.  Physical Exam:  Vital Signs for this encounter:  Blood  pressure 95/68, pulse 68, temperature (!) 97.5 F (36.4 C), temperature source Tympanic, resp. rate 18, height 5\' 2"  (1.575 m), weight 145 lb (65.8 kg), SpO2 96 %, unknown if currently breastfeeding. Body mass index is 26.52 kg/m. General: Alert, oriented, no acute distress.  HEENT: Normocephalic, atraumatic. Sclera anicteric.  Chest: Clear to auscultation bilaterally in upper lung fields, distant breath sounds in lower right lung. No wheezes, rhonchi, or rales. Cardiovascular: Regular rate and rhythm, no murmurs, rubs, or gallops.  Abdomen: Normoactive bowel sounds. Soft, moderately distended, nontender to palpation. Firm mass in mid to left abdomen, somewhat difficult to appreciate due to ascites. + palpable fluid wave.  Extremities: Grossly normal range of motion. Warm, well perfused. No edema bilaterally.  Skin: No rashes or lesions.  Lymphatics: No  cervical, supraclavicular, or inguinal adenopathy.  GU:  Normal external female genitalia. No lesions. Physiologic discharge, no bleeding.             Bladder/urethra:  No lesions or masses, well supported bladder             Vagina: well rugated, no lesions.             Cervix: Parous, transitional zone along outer aspect of the face of the cervix, no lesions. Pap and HPV testing collected.             Uterus: Small, mobile, no parametrial involvement or nodularity. Uterus with bulbous LUS, moves in conjunction with firm, heavy midline pelvic mass.             Adnexa: Described above.  Rectal: Deferred.  LABORATORY AND RADIOLOGIC DATA:  Outside medical records were reviewed to synthesize the above history, along with the history and physical obtained during the visit.   Lab Results  Component Value Date   WBC 6.7 04/21/2020   HGB 15.1 (H) 04/21/2020   HCT 48.5 (H) 04/21/2020   PLT 211 04/21/2020   GLUCOSE 103 (H) 04/22/2020   ALT 21 04/21/2020   AST 35 04/21/2020   NA 134 (L) 04/22/2020   K 4.3 04/22/2020   CL 97 (L) 04/22/2020   CREATININE 0.99 04/22/2020   BUN 8 04/22/2020   CO2 23 04/22/2020   INR 1.2 04/21/2020   HGBA1C 5.5 04/21/2020   Tumor markers: Testosterone  83 Inhibin A & B  pending Estradiol  656 hcg   <1 CA-125  979 LDH   447 AFP   6 CEA   1.5  CT A/P on 1/13: 1. 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 Ovarian lesion is typically a fibroma in this setting. There is a cleavage plane between the adnexal mass and the uterus, which makes a pedunculated fibroid unlikely. 2. There is a probable nabothian cyst in the cervix. 3. Ascites causes mass effect on the abdominopelvic structures.  CXR 1/15: No pneumothorax seen status post right-sided thoracentesis. Small right pleural effusion is noted.

## 2020-04-27 ENCOUNTER — Encounter (HOSPITAL_COMMUNITY)
Admission: RE | Disposition: A | Discharge status: Home / Self Care | Payer: Self-pay | Source: Ambulatory Visit | Attending: Gynecologic Oncology

## 2020-04-27 ENCOUNTER — Other Ambulatory Visit: Payer: Self-pay

## 2020-04-27 ENCOUNTER — Ambulatory Visit (HOSPITAL_COMMUNITY): Payer: Medicaid Other | Admitting: Physician Assistant

## 2020-04-27 ENCOUNTER — Encounter (HOSPITAL_COMMUNITY): Payer: Self-pay | Admitting: Gynecologic Oncology

## 2020-04-27 ENCOUNTER — Inpatient Hospital Stay (HOSPITAL_COMMUNITY)
Admission: RE | Admit: 2020-04-27 | Discharge: 2020-04-29 | DRG: 737 | Disposition: A | Discharge status: Home / Self Care | Payer: Medicaid Other | Source: Ambulatory Visit | Attending: Gynecologic Oncology | Admitting: Gynecologic Oncology

## 2020-04-27 DIAGNOSIS — C561 Malignant neoplasm of right ovary: Secondary | ICD-10-CM

## 2020-04-27 DIAGNOSIS — D3912 Neoplasm of uncertain behavior of left ovary: Principal | ICD-10-CM | POA: Diagnosis present

## 2020-04-27 DIAGNOSIS — Z820 Family history of epilepsy and other diseases of the nervous system: Secondary | ICD-10-CM | POA: Diagnosis not present

## 2020-04-27 DIAGNOSIS — R1900 Intra-abdominal and pelvic swelling, mass and lump, unspecified site: Secondary | ICD-10-CM | POA: Diagnosis present

## 2020-04-27 DIAGNOSIS — Q820 Hereditary lymphedema: Secondary | ICD-10-CM | POA: Diagnosis not present

## 2020-04-27 DIAGNOSIS — Z833 Family history of diabetes mellitus: Secondary | ICD-10-CM | POA: Diagnosis not present

## 2020-04-27 DIAGNOSIS — Z79899 Other long term (current) drug therapy: Secondary | ICD-10-CM | POA: Diagnosis not present

## 2020-04-27 DIAGNOSIS — C562 Malignant neoplasm of left ovary: Secondary | ICD-10-CM

## 2020-04-27 DIAGNOSIS — J90 Pleural effusion, not elsewhere classified: Secondary | ICD-10-CM | POA: Diagnosis not present

## 2020-04-27 DIAGNOSIS — N9489 Other specified conditions associated with female genital organs and menstrual cycle: Secondary | ICD-10-CM

## 2020-04-27 DIAGNOSIS — Z20822 Contact with and (suspected) exposure to covid-19: Secondary | ICD-10-CM | POA: Diagnosis present

## 2020-04-27 DIAGNOSIS — R188 Other ascites: Secondary | ICD-10-CM | POA: Diagnosis present

## 2020-04-27 HISTORY — PX: LAPAROTOMY: SHX154

## 2020-04-27 HISTORY — DX: Other ascites: R18.8

## 2020-04-27 HISTORY — PX: ROBOTIC ASSISTED SALPINGO OOPHERECTOMY: SHX6082

## 2020-04-27 HISTORY — DX: Intra-abdominal and pelvic swelling, mass and lump, unspecified site: R19.00

## 2020-04-27 LAB — SARS CORONAVIRUS 2 BY RT PCR (HOSPITAL ORDER, PERFORMED IN ~~LOC~~ HOSPITAL LAB): SARS Coronavirus 2: NEGATIVE

## 2020-04-27 LAB — CBC
HCT: 46.6 % — ABNORMAL HIGH (ref 36.0–46.0)
Hemoglobin: 15.1 g/dL — ABNORMAL HIGH (ref 12.0–15.0)
MCH: 25.2 pg — ABNORMAL LOW (ref 26.0–34.0)
MCHC: 32.4 g/dL (ref 30.0–36.0)
MCV: 77.8 fL — ABNORMAL LOW (ref 80.0–100.0)
Platelets: 206 10*3/uL (ref 150–400)
RBC: 5.99 MIL/uL — ABNORMAL HIGH (ref 3.87–5.11)
RDW: 14.2 % (ref 11.5–15.5)
WBC: 10 10*3/uL (ref 4.0–10.5)
nRBC: 0 % (ref 0.0–0.2)

## 2020-04-27 LAB — BASIC METABOLIC PANEL WITH GFR
Anion gap: 12 (ref 5–15)
BUN: 17 mg/dL (ref 6–20)
CO2: 23 mmol/L (ref 22–32)
Calcium: 8.9 mg/dL (ref 8.9–10.3)
Chloride: 94 mmol/L — ABNORMAL LOW (ref 98–111)
Creatinine, Ser: 1.3 mg/dL — ABNORMAL HIGH (ref 0.44–1.00)
GFR, Estimated: 56 mL/min — ABNORMAL LOW
Glucose, Bld: 137 mg/dL — ABNORMAL HIGH (ref 70–99)
Potassium: 4.9 mmol/L (ref 3.5–5.1)
Sodium: 129 mmol/L — ABNORMAL LOW (ref 135–145)

## 2020-04-27 LAB — TYPE AND SCREEN
ABO/RH(D): AB POS
Antibody Screen: NEGATIVE

## 2020-04-27 LAB — PREGNANCY, URINE: Preg Test, Ur: NEGATIVE

## 2020-04-27 LAB — INHIBIN B: Inhibin B: 3543.2 pg/mL

## 2020-04-27 SURGERY — SALPINGO-OOPHORECTOMY, ROBOT-ASSISTED
Anesthesia: General | Site: Abdomen | Laterality: Right

## 2020-04-27 MED ORDER — PHENYLEPHRINE HCL (PRESSORS) 10 MG/ML IV SOLN
INTRAVENOUS | Status: AC
  Filled 2020-04-27: qty 1

## 2020-04-27 MED ORDER — ONDANSETRON HCL 4 MG/2ML IJ SOLN
INTRAMUSCULAR | Status: DC | PRN
  Administered 2020-04-27: 4 mg via INTRAVENOUS

## 2020-04-27 MED ORDER — LIDOCAINE 2% (20 MG/ML) 5 ML SYRINGE
INTRAMUSCULAR | Status: DC | PRN
  Administered 2020-04-27: 100 mg via INTRAVENOUS

## 2020-04-27 MED ORDER — ENOXAPARIN SODIUM 40 MG/0.4ML ~~LOC~~ SOLN
40.0000 mg | SUBCUTANEOUS | Status: DC
  Administered 2020-04-28 – 2020-04-29 (×2): 40 mg via SUBCUTANEOUS
  Filled 2020-04-27 (×2): qty 0.4

## 2020-04-27 MED ORDER — NON FORMULARY: 1.0000 [IU] | Freq: Three times a day (TID) | Status: DC

## 2020-04-27 MED ORDER — FENTANYL CITRATE (PF) 100 MCG/2ML IJ SOLN
INTRAMUSCULAR | Status: DC | PRN
  Administered 2020-04-27 (×4): 50 ug via INTRAVENOUS

## 2020-04-27 MED ORDER — HYDROMORPHONE HCL 2 MG/ML IJ SOLN
INTRAMUSCULAR | Status: AC
  Filled 2020-04-27: qty 1

## 2020-04-27 MED ORDER — CELECOXIB 200 MG PO CAPS
400.0000 mg | ORAL_CAPSULE | ORAL | Status: AC
  Administered 2020-04-27: 400 mg via ORAL
  Filled 2020-04-27: qty 2

## 2020-04-27 MED ORDER — CHEWING GUM (ORBIT) SUGAR FREE
1.0000 | CHEWING_GUM | Freq: Three times a day (TID) | ORAL | Status: DC
  Administered 2020-04-28 – 2020-04-29 (×6): 1 via ORAL
  Filled 2020-04-27: qty 1

## 2020-04-27 MED ORDER — IBUPROFEN 400 MG PO TABS
600.0000 mg | ORAL_TABLET | Freq: Four times a day (QID) | ORAL | Status: DC
  Administered 2020-04-29 (×2): 600 mg via ORAL
  Filled 2020-04-27 (×2): qty 1

## 2020-04-27 MED ORDER — DEXAMETHASONE SODIUM PHOSPHATE 10 MG/ML IJ SOLN
INTRAMUSCULAR | Status: DC | PRN
  Administered 2020-04-27: 4 mg via INTRAVENOUS

## 2020-04-27 MED ORDER — PROPOFOL 10 MG/ML IV BOLUS
INTRAVENOUS | Status: DC | PRN
  Administered 2020-04-27: 120 mg via INTRAVENOUS

## 2020-04-27 MED ORDER — ACETAMINOPHEN 500 MG PO TABS
1000.0000 mg | ORAL_TABLET | ORAL | Status: AC
  Administered 2020-04-27: 1000 mg via ORAL
  Filled 2020-04-27: qty 2

## 2020-04-27 MED ORDER — LACTATED RINGERS IV SOLN: INTRAVENOUS | Status: DC

## 2020-04-27 MED ORDER — CHLORHEXIDINE GLUCONATE 0.12 % MT SOLN: 15.0000 mL | Freq: Once | OROMUCOSAL | Status: DC

## 2020-04-27 MED ORDER — SCOPOLAMINE 1 MG/3DAYS TD PT72
1.0000 | MEDICATED_PATCH | TRANSDERMAL | Status: DC
  Administered 2020-04-27: 1.5 mg via TRANSDERMAL
  Filled 2020-04-27: qty 1

## 2020-04-27 MED ORDER — PHENYLEPHRINE 40 MCG/ML (10ML) SYRINGE FOR IV PUSH (FOR BLOOD PRESSURE SUPPORT)
PREFILLED_SYRINGE | INTRAVENOUS | Status: AC
  Filled 2020-04-27: qty 30

## 2020-04-27 MED ORDER — PHENYLEPHRINE HCL-NACL 10-0.9 MG/250ML-% IV SOLN
INTRAVENOUS | Status: DC | PRN
  Administered 2020-04-27: 125 ug/min via INTRAVENOUS
  Administered 2020-04-27: 50 ug/min via INTRAVENOUS

## 2020-04-27 MED ORDER — ACETAMINOPHEN 500 MG PO TABS
1000.0000 mg | ORAL_TABLET | Freq: Four times a day (QID) | ORAL | Status: DC
  Administered 2020-04-27 – 2020-04-29 (×7): 1000 mg via ORAL
  Filled 2020-04-27 (×7): qty 2

## 2020-04-27 MED ORDER — ONDANSETRON HCL 4 MG/2ML IJ SOLN
INTRAMUSCULAR | Status: AC
  Filled 2020-04-27: qty 2

## 2020-04-27 MED ORDER — PHENYLEPHRINE 40 MCG/ML (10ML) SYRINGE FOR IV PUSH (FOR BLOOD PRESSURE SUPPORT)
PREFILLED_SYRINGE | INTRAVENOUS | Status: DC | PRN
  Administered 2020-04-27 (×2): 120 ug via INTRAVENOUS
  Administered 2020-04-27: 160 ug via INTRAVENOUS
  Administered 2020-04-27 (×2): 120 ug via INTRAVENOUS

## 2020-04-27 MED ORDER — BUPIVACAINE HCL 0.25 % IJ SOLN
INTRAMUSCULAR | Status: DC | PRN
  Administered 2020-04-27: 20 mL
  Administered 2020-04-27: 30 mL

## 2020-04-27 MED ORDER — MIDAZOLAM HCL 2 MG/2ML IJ SOLN
INTRAMUSCULAR | Status: DC | PRN
  Administered 2020-04-27: 2 mg via INTRAVENOUS

## 2020-04-27 MED ORDER — STERILE WATER FOR IRRIGATION IR SOLN
Status: DC | PRN
  Administered 2020-04-27: 1000 mL

## 2020-04-27 MED ORDER — KETOROLAC TROMETHAMINE 15 MG/ML IJ SOLN
15.0000 mg | Freq: Four times a day (QID) | INTRAMUSCULAR | Status: AC
  Administered 2020-04-27 – 2020-04-28 (×4): 15 mg via INTRAVENOUS
  Filled 2020-04-27 (×4): qty 1

## 2020-04-27 MED ORDER — PREGABALIN 75 MG PO CAPS
75.0000 mg | ORAL_CAPSULE | Freq: Two times a day (BID) | ORAL | Status: DC
  Administered 2020-04-28 – 2020-04-29 (×3): 75 mg via ORAL
  Filled 2020-04-27 (×4): qty 1

## 2020-04-27 MED ORDER — PROPOFOL 10 MG/ML IV BOLUS
INTRAVENOUS | Status: AC
  Filled 2020-04-27: qty 20

## 2020-04-27 MED ORDER — LIDOCAINE HCL (PF) 2 % IJ SOLN
INTRAMUSCULAR | Status: AC
  Filled 2020-04-27: qty 5

## 2020-04-27 MED ORDER — EPHEDRINE SULFATE-NACL 50-0.9 MG/10ML-% IV SOSY
PREFILLED_SYRINGE | INTRAVENOUS | Status: DC | PRN
  Administered 2020-04-27: 10 mg via INTRAVENOUS
  Administered 2020-04-27: 15 mg via INTRAVENOUS

## 2020-04-27 MED ORDER — ALBUMIN HUMAN 5 % IV SOLN: INTRAVENOUS | Status: DC | PRN

## 2020-04-27 MED ORDER — CEFAZOLIN SODIUM-DEXTROSE 2-4 GM/100ML-% IV SOLN
2.0000 g | INTRAVENOUS | Status: AC
  Administered 2020-04-27: 2 g via INTRAVENOUS
  Filled 2020-04-27: qty 100

## 2020-04-27 MED ORDER — MIDAZOLAM HCL 2 MG/2ML IJ SOLN
INTRAMUSCULAR | Status: AC
  Filled 2020-04-27: qty 2

## 2020-04-27 MED ORDER — GABAPENTIN 300 MG PO CAPS
300.0000 mg | ORAL_CAPSULE | ORAL | Status: AC
  Administered 2020-04-27: 300 mg via ORAL
  Filled 2020-04-27: qty 1

## 2020-04-27 MED ORDER — ONDANSETRON HCL 4 MG/2ML IJ SOLN: 4.0000 mg | Freq: Four times a day (QID) | INTRAMUSCULAR | Status: DC | PRN

## 2020-04-27 MED ORDER — FENTANYL CITRATE (PF) 100 MCG/2ML IJ SOLN
INTRAMUSCULAR | Status: AC
  Filled 2020-04-27: qty 2

## 2020-04-27 MED ORDER — BUPIVACAINE HCL 0.25 % IJ SOLN
INTRAMUSCULAR | Status: AC
  Filled 2020-04-27: qty 1

## 2020-04-27 MED ORDER — OXYCODONE HCL 5 MG PO TABS: 5.0000 mg | ORAL_TABLET | ORAL | Status: DC | PRN

## 2020-04-27 MED ORDER — EPHEDRINE 5 MG/ML INJ
INTRAVENOUS | Status: AC
  Filled 2020-04-27: qty 10

## 2020-04-27 MED ORDER — ONDANSETRON HCL 4 MG PO TABS: 4.0000 mg | ORAL_TABLET | Freq: Four times a day (QID) | ORAL | Status: DC | PRN

## 2020-04-27 MED ORDER — 0.9 % SODIUM CHLORIDE (POUR BTL) OPTIME
TOPICAL | Status: DC | PRN
  Administered 2020-04-27 (×4): 1000 mL

## 2020-04-27 MED ORDER — LACTATED RINGERS IR SOLN
Status: DC | PRN
  Administered 2020-04-27: 1000 mL

## 2020-04-27 MED ORDER — OXYCODONE HCL 5 MG/5ML PO SOLN: 5.0000 mg | Freq: Once | ORAL | Status: DC | PRN

## 2020-04-27 MED ORDER — HYDROMORPHONE HCL 1 MG/ML IJ SOLN
INTRAMUSCULAR | Status: DC | PRN
  Administered 2020-04-27 (×2): .5 mg via INTRAVENOUS

## 2020-04-27 MED ORDER — ROCURONIUM BROMIDE 10 MG/ML (PF) SYRINGE
PREFILLED_SYRINGE | INTRAVENOUS | Status: DC | PRN
  Administered 2020-04-27: 10 mg via INTRAVENOUS
  Administered 2020-04-27: 20 mg via INTRAVENOUS
  Administered 2020-04-27: 10 mg via INTRAVENOUS
  Administered 2020-04-27: 50 mg via INTRAVENOUS
  Administered 2020-04-27: 10 mg via INTRAVENOUS

## 2020-04-27 MED ORDER — ALBUMIN HUMAN 5 % IV SOLN
INTRAVENOUS | Status: AC
  Filled 2020-04-27: qty 250

## 2020-04-27 MED ORDER — OXYCODONE HCL 5 MG PO TABS: 5.0000 mg | ORAL_TABLET | Freq: Once | ORAL | Status: DC | PRN

## 2020-04-27 MED ORDER — ROCURONIUM BROMIDE 10 MG/ML (PF) SYRINGE
PREFILLED_SYRINGE | INTRAVENOUS | Status: AC
  Filled 2020-04-27: qty 10

## 2020-04-27 MED ORDER — KCL IN DEXTROSE-NACL 20-5-0.45 MEQ/L-%-% IV SOLN
INTRAVENOUS | Status: DC
  Filled 2020-04-27 (×5): qty 1000

## 2020-04-27 MED ORDER — SENNOSIDES-DOCUSATE SODIUM 8.6-50 MG PO TABS
2.0000 | ORAL_TABLET | Freq: Every day | ORAL | Status: DC
  Administered 2020-04-27 – 2020-04-28 (×2): 2 via ORAL
  Filled 2020-04-27 (×2): qty 2

## 2020-04-27 MED ORDER — ALBUMIN HUMAN 5 % IV SOLN
INTRAVENOUS | Status: AC
  Filled 2020-04-27: qty 750

## 2020-04-27 MED ORDER — PROMETHAZINE HCL 25 MG/ML IJ SOLN
6.2500 mg | INTRAMUSCULAR | Status: DC | PRN
Start: 2020-04-27 — End: 2020-04-27

## 2020-04-27 MED ORDER — FENTANYL CITRATE (PF) 100 MCG/2ML IJ SOLN: 25.0000 ug | INTRAMUSCULAR | Status: DC | PRN

## 2020-04-27 MED ORDER — ENSURE ENLIVE PO LIQD
237.0000 mL | Freq: Two times a day (BID) | ORAL | Status: DC
  Administered 2020-04-28 – 2020-04-29 (×3): 237 mL via ORAL

## 2020-04-27 MED ORDER — HEPARIN SODIUM (PORCINE) 5000 UNIT/ML IJ SOLN
5000.0000 [IU] | INTRAMUSCULAR | Status: AC
  Administered 2020-04-27: 5000 [IU] via SUBCUTANEOUS
  Filled 2020-04-27: qty 1

## 2020-04-27 MED ORDER — DEXAMETHASONE SODIUM PHOSPHATE 4 MG/ML IJ SOLN: 4.0000 mg | INTRAMUSCULAR | Status: DC

## 2020-04-27 MED ORDER — DEXAMETHASONE SODIUM PHOSPHATE 10 MG/ML IJ SOLN
INTRAMUSCULAR | Status: AC
  Filled 2020-04-27: qty 1

## 2020-04-27 MED ORDER — ACETAMINOPHEN 500 MG PO TABS: 1000.0000 mg | ORAL_TABLET | Freq: Once | ORAL | Status: DC

## 2020-04-27 MED ORDER — SODIUM CHLORIDE 0.9 % IV SOLN
1.0000 g | Freq: Once | INTRAVENOUS | Status: AC
  Administered 2020-04-27: 1 g via INTRAVENOUS
  Filled 2020-04-27: qty 1

## 2020-04-27 MED ORDER — ORAL CARE MOUTH RINSE: 15.0000 mL | Freq: Once | OROMUCOSAL | Status: DC

## 2020-04-27 SURGICAL SUPPLY — 85 items
ADH SKN CLS APL DERMABOND .7 (GAUZE/BANDAGES/DRESSINGS) ×2
AGENT HMST KT MTR STRL THRMB (HEMOSTASIS)
APL ESCP 34 STRL LF DISP (HEMOSTASIS)
APPLICATOR SURGIFLO ENDO (HEMOSTASIS) IMPLANT
BACTOSHIELD CHG 4% 4OZ (MISCELLANEOUS) ×1
BAG LAPAROSCOPIC 12 15 PORT 16 (BASKET) IMPLANT
BAG RETRIEVAL 12/15 (BASKET) ×3
BAG SPEC RTRVL LRG 6X4 10 (ENDOMECHANICALS)
BLADE EXTENDED COATED 6.5IN (ELECTRODE) ×1 IMPLANT
BLADE SURG SZ10 CARB STEEL (BLADE) IMPLANT
CELLS DAT CNTRL 66122 CELL SVR (MISCELLANEOUS) IMPLANT
COVER BACK TABLE 60X90IN (DRAPES) ×3 IMPLANT
COVER TIP SHEARS 8 DVNC (MISCELLANEOUS) ×2 IMPLANT
COVER TIP SHEARS 8MM DA VINCI (MISCELLANEOUS) ×3
COVER WAND RF STERILE (DRAPES) IMPLANT
DECANTER SPIKE VIAL GLASS SM (MISCELLANEOUS) ×1 IMPLANT
DERMABOND ADVANCED (GAUZE/BANDAGES/DRESSINGS) ×1
DERMABOND ADVANCED .7 DNX12 (GAUZE/BANDAGES/DRESSINGS) ×2 IMPLANT
DRAPE ARM DVNC X/XI (DISPOSABLE) ×8 IMPLANT
DRAPE COLUMN DVNC XI (DISPOSABLE) ×2 IMPLANT
DRAPE DA VINCI XI ARM (DISPOSABLE) ×12
DRAPE DA VINCI XI COLUMN (DISPOSABLE) ×3
DRAPE SHEET LG 3/4 BI-LAMINATE (DRAPES) ×3 IMPLANT
DRAPE SURG IRRIG POUCH 19X23 (DRAPES) ×3 IMPLANT
DRSG OPSITE POSTOP 4X10 (GAUZE/BANDAGES/DRESSINGS) ×1 IMPLANT
DRSG OPSITE POSTOP 4X6 (GAUZE/BANDAGES/DRESSINGS) IMPLANT
DRSG OPSITE POSTOP 4X8 (GAUZE/BANDAGES/DRESSINGS) IMPLANT
ELECT PENCIL ROCKER SW 15FT (MISCELLANEOUS) ×1 IMPLANT
ELECT REM PT RETURN 15FT ADLT (MISCELLANEOUS) ×3 IMPLANT
GLOVE SURG ENC MOIS LTX SZ6 (GLOVE) ×12 IMPLANT
GLOVE SURG ENC MOIS LTX SZ6.5 (GLOVE) ×6 IMPLANT
GOWN STRL REUS W/ TWL LRG LVL3 (GOWN DISPOSABLE) ×8 IMPLANT
GOWN STRL REUS W/TWL LRG LVL3 (GOWN DISPOSABLE) ×12
HOLDER FOLEY CATH W/STRAP (MISCELLANEOUS) ×3 IMPLANT
IRRIG SUCT STRYKERFLOW 2 WTIP (MISCELLANEOUS) ×3
IRRIGATION SUCT STRKRFLW 2 WTP (MISCELLANEOUS) ×2 IMPLANT
KIT PROCEDURE DA VINCI SI (MISCELLANEOUS)
KIT PROCEDURE DVNC SI (MISCELLANEOUS) IMPLANT
KIT TURNOVER KIT A (KITS) ×1 IMPLANT
LIGASURE IMPACT 36 18CM CVD LR (INSTRUMENTS) ×1 IMPLANT
MANIPULATOR UTERINE 4.5 ZUMI (MISCELLANEOUS) ×3 IMPLANT
NDL HYPO 21X1.5 SAFETY (NEEDLE) ×2 IMPLANT
NDL SPNL 18GX3.5 QUINCKE PK (NEEDLE) IMPLANT
NEEDLE HYPO 21X1.5 SAFETY (NEEDLE) ×3 IMPLANT
NEEDLE SPNL 18GX3.5 QUINCKE PK (NEEDLE) IMPLANT
OBTURATOR OPTICAL STANDARD 8MM (TROCAR) ×3
OBTURATOR OPTICAL STND 8 DVNC (TROCAR) ×2
OBTURATOR OPTICALSTD 8 DVNC (TROCAR) ×2 IMPLANT
PACK ROBOT GYN CUSTOM WL (TRAY / TRAY PROCEDURE) ×3 IMPLANT
PAD POSITIONING PINK XL (MISCELLANEOUS) ×3 IMPLANT
PORT ACCESS TROCAR AIRSEAL 12 (TROCAR) ×2 IMPLANT
PORT ACCESS TROCAR AIRSEAL 5M (TROCAR) ×1
PORT LAP GEL ALEXIS MED 5-9CM (MISCELLANEOUS) ×1 IMPLANT
POUCH SPECIMEN RETRIEVAL 10MM (ENDOMECHANICALS) IMPLANT
RELOAD PROXIMATE 75MM BLUE (ENDOMECHANICALS) ×9 IMPLANT
RELOAD STAPLE 75 3.8 BLU REG (ENDOMECHANICALS) IMPLANT
RETRACTOR WND ALEXIS 18 MED (MISCELLANEOUS) IMPLANT
RTRCTR WOUND ALEXIS 18CM MED (MISCELLANEOUS)
SCRUB CHG 4% DYNA-HEX 4OZ (MISCELLANEOUS) ×2 IMPLANT
SEAL CANN UNIV 5-8 DVNC XI (MISCELLANEOUS) ×8 IMPLANT
SEAL XI 5MM-8MM UNIVERSAL (MISCELLANEOUS) ×12
SET TRI-LUMEN FLTR TB AIRSEAL (TUBING) ×3 IMPLANT
SPONGE LAP 18X18 RF (DISPOSABLE) ×5 IMPLANT
STAPLER CANNULA SEAL DVNC XI (STAPLE) IMPLANT
STAPLER CANNULA SEAL XI (STAPLE) ×3
STAPLER GUN LINEAR PROX 60 (STAPLE) ×1 IMPLANT
STAPLER PROXIMATE 75MM BLUE (STAPLE) ×1 IMPLANT
SURGIFLO W/THROMBIN 8M KIT (HEMOSTASIS) IMPLANT
SUT MNCRL AB 4-0 PS2 18 (SUTURE) ×1 IMPLANT
SUT PDS AB 1 TP1 96 (SUTURE) ×2 IMPLANT
SUT VIC AB 0 CT1 27 (SUTURE)
SUT VIC AB 0 CT1 27XBRD ANTBC (SUTURE) IMPLANT
SUT VIC AB 2-0 CT1 27 (SUTURE) ×3
SUT VIC AB 2-0 CT1 TAPERPNT 27 (SUTURE) IMPLANT
SUT VIC AB 2-0 SH 18 (SUTURE) ×1 IMPLANT
SUT VIC AB 3-0 SH 18 (SUTURE) ×1 IMPLANT
SUT VIC AB 4-0 PS2 18 (SUTURE) ×6 IMPLANT
SYR 10ML LL (SYRINGE) IMPLANT
TOWEL OR NON WOVEN STRL DISP B (DISPOSABLE) ×3 IMPLANT
TRAP SPECIMEN MUCUS 40CC (MISCELLANEOUS) ×1 IMPLANT
TRAY FOLEY MTR SLVR 16FR STAT (SET/KITS/TRAYS/PACK) ×3 IMPLANT
TROCAR XCEL NON-BLD 5MMX100MML (ENDOMECHANICALS) IMPLANT
UNDERPAD 30X36 HEAVY ABSORB (UNDERPADS AND DIAPERS) ×3 IMPLANT
YANKAUER SUCT BULB TIP 10FT TU (MISCELLANEOUS) ×1 IMPLANT
YANKAUER SUCT BULB TIP NO VENT (SUCTIONS) IMPLANT

## 2020-04-27 NOTE — Interval H&P Note (Signed)
History and Physical Interval Note:  04/27/2020 10:15 AM  Brandy Knight  has presented today for surgery, with the diagnosis of PELVIC MASS, ASCITES.  The various methods of treatment have been discussed with the patient and family. After consideration of risks, benefits and other options for treatment, the patient has consented to  Procedure(s): XI ROBOTIC ASSISTED UNITLATERAL SALPINGO OOPHORECTOMY WITH POSSIBLE STAGING (N/A) MINI LAPAROTOMY (N/A) as a surgical intervention.  The patient's history has been reviewed, patient examined, no change in status, stable for surgery.  I have reviewed the patient's chart and labs.  Questions were answered to the patient's satisfaction.     Lafonda Mosses

## 2020-04-27 NOTE — Anesthesia Postprocedure Evaluation (Signed)
Anesthesia Post Note  Patient: EMELY FAHY  Procedure(s) Performed: XI ROBOTIC ASSISTED UNITLATERAL SALPINGO OOPHORECTOMY (Right Abdomen) MINI LAPAROTOMY, ILEOCECAL RESECTION, APPENDECTOMY (N/A )     Patient location during evaluation: PACU Anesthesia Type: General Level of consciousness: awake and alert and oriented Pain management: pain level controlled Vital Signs Assessment: post-procedure vital signs reviewed and stable Respiratory status: spontaneous breathing, nonlabored ventilation and respiratory function stable Cardiovascular status: blood pressure returned to baseline Postop Assessment: no apparent nausea or vomiting Anesthetic complications: no   No complications documented.  Last Vitals:  Vitals:   04/27/20 1850 04/27/20 1900  BP: 98/64 95/64  Pulse: 81 75  Resp: 20 16  Temp:  36.5 C  SpO2: 99% 99%    Last Pain:  Vitals:   04/27/20 1900  TempSrc:   PainSc: 0-No pain                 Brennan Bailey

## 2020-04-27 NOTE — Op Note (Signed)
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   Anesthesia: GET  Urine Output: 100cc  Operative Findings: *On EUA, small mobile uterus with mass that moves in junction with the uterus, sitting out of the pelvis and palpable with the abdominal hand. Mass is firm and adherent to the anterior abdominal wall. On intra-abdominal entry, approximately 6L of green-tinged ascites noted and removed. Normal appearing upper abdominal survey. Normal appearing omentum. 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 zsome pockets of degeneration that are unavoidably entered during gentle manipulation of the ovary. Mass with dense adhesions to the anterior abdominal wall on the right, the right sidewall, and several loops of ileum. Uterus 8cm and normal appearing. Right fallopian tube and ovary without evidence of disease. Some inflammatory rind noted on the small and large bowel. Given size of mass and its suspected degeneration and poor integrity, unable to place in an Endocatch bag and midline excision extended for specimen removal. Given concern for thermal damage to the terminal ileum from lyssi of dense adhesions, decision made to proceed with ileocectomy. 1-1.5cm mesenteric node palpated within the mesentery  removed. Some shotty sub centimeter mesenteric nodes palpated. No para-aortic or pelvic lymphadenopathy. Liver edge, diaphragm and stomach smooth. Omentum without evidence of disease. An additional 3L of ascites was removed over the course of the surgery.  Estimated Blood Loss:  350cc   Total IV Fluids: 2L crystalloid ,1L albumin  9L of ascites removed intra-op          Specimens: ascites, left adnexa, terminal ileum, appendix and cecum, omentum          Complications:  None apparent; patient tolerated the procedure well.         Disposition: PACU - hemodynamically stable.  Procedure Details  The patient was seen in the Holding Room. The risks, benefits, complications, treatment options, and expected outcomes were discussed with the patient.  The patient concurred with the proposed plan, giving informed consent.  The site of surgery properly noted/marked. The patient was identified as Brandy Knight and the procedure verified as a Robotic-assisted USO, possible staging, possible mini-laparotomy.  After induction of anesthesia, the patient was draped and prepped in the usual sterile manner. Patient was placed in supine position after anesthesia and draped and prepped in the usual sterile manner as follows: Her arms were tucked to her side with all appropriate precautions.  The shoulders were stabilized with padded shoulder blocks applied to the acromium processes.  The patient was placed in the semi-lithotomy position in [place].  The perineum and vagina were prepped with CholoraPrep. The patient was draped after the CholoraPrep had been allowed to dry for 3 minutes.  A Time Out was held and the above information confirmed.  The urethra was prepped with Betadine. Foley catheter was placed.  A sterile speculum was placed in the vagina.  The cervix was grasped with a single-tooth tenaculum. The cervix was dilated with Kennon Rounds dilators.  The ZUMI  uterine manipulator with a medium colpotomizer ring  was placed without difficulty.  A pneum occluder balloon was placed over the manipulator.  OG tube placement was confirmed and to suction.   Next, a 10 mm skin incision was made 1 cm below the subcostal margin in the midclavicular line.  The 5 mm Optiview port and scope was used for direct entry.  Opening pressure was under 10 mm CO2.  The abdomen was insufflated and the findings were noted as above.   At this point and all points during the procedure, the patient's intra-abdominal pressure did not exceed 15 mmHg. Next, an 8 mm skin incision was made superior the umbilicus and a right and left port were placed about 8 cm lateral to the robot port on the right and left side. A fourth arm was placed on the right.  The 5 mm assist trocar was exchanged for a 10-12 mm port. All ports were placed under direct visualization.  The midline robotic incision was extended to a length of 8cm and the laparoscopic cap system was placed and the abdomen reinsufflated. The patient was placed in steep Trendelenburg.  The robot was docked in the normal manner.  Attention was turned anteriorly and a combination of sharp and blunt dissection and electrocautery were used to lyse dense adhesions of the mass to the anterior abdominal wall. The same was performed with the adhesions to the right anterior abdominal/side wall. Two loops of the ileum were also densely adherent to the mass and sharp dissection and short bursts of electrocautery used to lyse these and achieve hemostasis. Once freed from all attachments to other abdominal surfaces, attention was shifted to the left adnexa. The left retroperitoneum was opened and the ureter identified. The IP ligament was skeletonized after a window was made in the medial broad ligament. The utero-ovarian ligament and fallopian tube were skeletonized, cauterized and transected. The IP ligament was further skeletonized, cauterized and transected, freeing the adnexa. An attempt was made to place  the adnexa in a 36mm spleen bag but this was unsuccessful.  Robotic instruments were removed under direct visulaization.  The robot was undocked.The [employer] and laparoscopic system were removed and the midline mini-lap were extended to just inferior to the umbilicus. The adnexa was ultimately delivered piecemeal due to its more integrity. The mass was handed off the field and sent for frozen.   The ileum was examined where adhesions had been lysed. Given concern for possible thermal injury due to cautery just adjacent to the bowel serosa, the decision was made to perform a resection and reanastomosis. The area of concern was <10cm from the ileocecal junction and thus the decision made to proceed with an ileocecal resection.  A window was created at the junction of the bowel wall and mesentery proximal to the section of ileum in question. The bowel stapler was passed through the window and and the 57mm GIA stapler was deployed and fired. The cecum and right colon were mobilized laterally using monopolar electrocautery along the peritoneum. After identification of the gondal vessels and ureter, the cecum and appendix were also mobilized. A window was created at the junction of the bowel wall and mesentery along the ascending colon.  The bowel stapler was passed through the window and and the 43mm GIA stapler was deployed and fired.  The mesentery was scored then sealed and transected with ligasure to cauterize and transect the mesentery, ultimately freeing the terminal ileum and cecum, which were handed off the field.  An infragastric omentectomy was then performed after dissecting the omentum off of the transverse colon. This was taken down with a combination of Bovie cautery and the LigaSure. Palpation of the upper abdomen, including the liver, spleen, stomach, and pancreas, was normal.  The abdomen and pelvis were then copiously irrigated and all surgical sites found to be hemostatic.   Frozen  section returned. Given no visible or palpable disease and differential diagnosis given pathology review, decision made to proceed as planned with fertility sparing.   Attention was then turned to the reanastomosis of the bowel. The bowel ends were placed side by side along anti-mesenteric surfaces. Two stay sutures using 2-0 Vicryl were placed to reapproximate the two segments to each other. The corners of the bowel ends were elevated and opened. The GIA stapler was passed into these limbs, deployed and fired to create a stapled functional side-to-side anastamosis. The opening of the two limbs was reapproximated with allis clamps. A 62mm TA stapler was used to seal the top of the anastamosis with a double layer of staples. The mesenteric window was closed with 3-0vicryl in a running manner.  The surgeons and assistants' gloves were changed and the incision redraped for closing. The fascial incision was closed with two running 0 looped PDS tied in the midline. The subcutaneous tissue was irrigated and additional marcaine was injected for local anesthesia. 2-0 and 4-0 Vicryl were used to burry the PDS knots and to reapproximate the subcutaneous tissue.  The fascia at the 10-12 mm port was closed with 0 Vicryl on a UR-5 needle.  The subcuticular tissue at all incisions was closed with 4-0 Vicryl and the skin was closed with 4-0 Monocryl in a subcuticular manner.  Dermabond was applied.  The midline incision was covered with a honeycomb dressing.   The manipulator was removed. The vagina was swabbed with  minimal bleeding noted.   All sponge, lap and needle counts were correct x  3.   The patient was transferred to the recovery room in stable condition.  Jeral Pinch, MD

## 2020-04-27 NOTE — Anesthesia Procedure Notes (Signed)
Procedure Name: Intubation Date/Time: 04/27/2020 1:34 PM Performed by: Niel Hummer, CRNA Pre-anesthesia Checklist: Patient identified, Suction available, Emergency Drugs available and Patient being monitored Patient Re-evaluated:Patient Re-evaluated prior to induction Oxygen Delivery Method: Circle system utilized Preoxygenation: Pre-oxygenation with 100% oxygen Induction Type: IV induction Ventilation: Mask ventilation without difficulty Laryngoscope Size: Mac and 4 Grade View: Grade I Tube type: Oral Tube size: 7.0 mm Number of attempts: 1 Airway Equipment and Method: Stylet Placement Confirmation: ETT inserted through vocal cords under direct vision,  positive ETCO2 and breath sounds checked- equal and bilateral Secured at: 22 cm Tube secured with: Tape Dental Injury: Teeth and Oropharynx as per pre-operative assessment

## 2020-04-27 NOTE — Brief Op Note (Signed)
04/27/2020  5:45 PM  PATIENT:  Brandy Knight  30 y.o. female  PRE-OPERATIVE DIAGNOSIS:  PELVIC MASS, ASCITES  POST-OPERATIVE DIAGNOSIS:  PELVIC MASS, ASCITES  PROCEDURE:  Procedure(s): XI ROBOTIC ASSISTED UNITLATERAL SALPINGO OOPHORECTOMY (Right) MINI LAPAROTOMY, ILEOCECAL RESECTION, APPENDECTOMY (N/A)  SURGEON:  Surgeon(s) and Role:    Lafonda Mosses, MD - Primary    Lahoma Crocker, MD - Assisting  ANESTHESIA:   general, local (marcaine)  EBL:  350cc  Ascites drained: approximately 9 L  BLOOD ADMINISTERED:none, 2L IVFs and 1L albumin given  DRAINS: n/a   LOCAL MEDICATIONS USED:  MARCAINE     SPECIMEN: left adnexa for frozen, ileum/cecum/appendix, omentum, ascites  DISPOSITION OF SPECIMEN:  PATHOLOGY  COUNTS:  YES  TOURNIQUET:  * No tourniquets in log *  DICTATION: .Note written in EPIC  PLAN OF CARE: Admit to inpatient   PATIENT DISPOSITION:  PACU - guarded condition. Will evaluate appropriateness for step down vs floor after recovery in PACU   Delay start of Pharmacological VTE agent (>24hrs) due to surgical blood loss or risk of bleeding: no

## 2020-04-27 NOTE — Anesthesia Postprocedure Evaluation (Signed)
Anesthesia Post Note  Patient: Brandy Knight  Procedure(s) Performed: XI ROBOTIC ASSISTED UNITLATERAL SALPINGO OOPHORECTOMY (Right Abdomen) MINI LAPAROTOMY, ILEOCECAL RESECTION, APPENDECTOMY (N/A )     Patient location during evaluation: PACU Anesthesia Type: General Level of consciousness: awake and alert and oriented Pain management: pain level controlled Vital Signs Assessment: post-procedure vital signs reviewed and stable Respiratory status: spontaneous breathing, nonlabored ventilation and respiratory function stable Cardiovascular status: blood pressure returned to baseline Postop Assessment: no apparent nausea or vomiting Anesthetic complications: no   No complications documented.  Last Vitals:  Vitals:   04/27/20 1850 04/27/20 1900  BP: 98/64 95/64  Pulse: 81 75  Resp: 20 16  Temp:  36.5 C  SpO2: 99% 99%    Last Pain:  Vitals:   04/27/20 1900  TempSrc:   PainSc: 0-No pain                 Beauty Pless E Augusta Mirkin     

## 2020-04-27 NOTE — Transfer of Care (Signed)
Immediate Anesthesia Transfer of Care Note  Patient: Brandy Knight  Procedure(s) Performed: XI ROBOTIC ASSISTED UNITLATERAL SALPINGO OOPHORECTOMY (Right Abdomen) MINI LAPAROTOMY, ILEOCECAL RESECTION, APPENDECTOMY (N/A )  Patient Location: PACU  Anesthesia Type:General  Level of Consciousness: awake  Airway & Oxygen Therapy: Patient Spontanous Breathing and Patient connected to face mask oxygen  Post-op Assessment: Report given to RN and Post -op Vital signs reviewed and stable  Post vital signs: Reviewed and stable  Last Vitals:  Vitals Value Taken Time  BP 113/65 04/27/20 1805  Temp 36.6 C 04/27/20 1805  Pulse 92 04/27/20 1811  Resp 20 04/27/20 1811  SpO2 100 % 04/27/20 1811  Vitals shown include unvalidated device data.  Last Pain:  Vitals:   04/27/20 1020  TempSrc: Oral         Complications: No complications documented.

## 2020-04-27 NOTE — Treatment Plan (Signed)
Spoke with the patient about findings at surgery, procedures performed, frozen pathology. All questions answered.  Jeral Pinch MD Gynecologic Oncology

## 2020-04-28 ENCOUNTER — Encounter (HOSPITAL_COMMUNITY): Payer: Self-pay | Admitting: Gynecologic Oncology

## 2020-04-28 ENCOUNTER — Telehealth: Payer: Self-pay

## 2020-04-28 LAB — BASIC METABOLIC PANEL
Anion gap: 8 (ref 5–15)
BUN: 14 mg/dL (ref 6–20)
CO2: 23 mmol/L (ref 22–32)
Calcium: 7.7 mg/dL — ABNORMAL LOW (ref 8.9–10.3)
Chloride: 98 mmol/L (ref 98–111)
Creatinine, Ser: 1.06 mg/dL — ABNORMAL HIGH (ref 0.44–1.00)
GFR, Estimated: >60 mL/min (ref >60)
Glucose, Bld: 138 mg/dL — ABNORMAL HIGH (ref 70–99)
Potassium: 4.9 mmol/L (ref 3.5–5.1)
Sodium: 129 mmol/L — ABNORMAL LOW (ref 135–145)

## 2020-04-28 LAB — CBC
HCT: 31.9 % — ABNORMAL LOW (ref 36.0–46.0)
HCT: 30.3 % — ABNORMAL LOW (ref 36.0–46.0)
Hemoglobin: 10.1 g/dL — ABNORMAL LOW (ref 12.0–15.0)
Hemoglobin: 9.6 g/dL — ABNORMAL LOW (ref 12.0–15.0)
MCH: 25.3 pg — ABNORMAL LOW (ref 26.0–34.0)
MCH: 25.9 pg — ABNORMAL LOW (ref 26.0–34.0)
MCHC: 31.7 g/dL (ref 30.0–36.0)
MCHC: 31.7 g/dL (ref 30.0–36.0)
MCV: 79.8 fL — ABNORMAL LOW (ref 80.0–100.0)
MCV: 81.7 fL (ref 80.0–100.0)
Platelets: 150 10*3/uL (ref 150–400)
Platelets: 126 10*3/uL — ABNORMAL LOW (ref 150–400)
RBC: 4 MIL/uL (ref 3.87–5.11)
RBC: 3.71 MIL/uL — ABNORMAL LOW (ref 3.87–5.11)
RDW: 14 % (ref 11.5–15.5)
RDW: 14.4 % (ref 11.5–15.5)
WBC: 13.7 10*3/uL — ABNORMAL HIGH (ref 4.0–10.5)
WBC: 11.5 10*3/uL — ABNORMAL HIGH (ref 4.0–10.5)
nRBC: 0 % (ref 0.0–0.2)
nRBC: 0 % (ref 0.0–0.2)

## 2020-04-28 LAB — CYTOLOGY - NON PAP

## 2020-04-28 LAB — MAGNESIUM: Magnesium: 1.7 mg/dL (ref 1.7–2.4)

## 2020-04-28 MED ORDER — SODIUM CHLORIDE 0.9 % IV BOLUS
1000.0000 mL | Freq: Once | INTRAVENOUS | Status: AC
  Administered 2020-04-28: 11:00:00 1000 mL via INTRAVENOUS

## 2020-04-28 MED ORDER — ENOXAPARIN (LOVENOX) PATIENT EDUCATION KIT
PACK | Freq: Once | Status: AC
  Filled 2020-04-28: qty 1

## 2020-04-28 NOTE — Addendum Note (Signed)
Addendum  created 04/28/20 0924 by Sharlette Dense, CRNA   Charge Capture section accepted

## 2020-04-28 NOTE — Progress Notes (Signed)
Pt BP low, 82/52. MD Berline Lopes notified. No new orders at this time .

## 2020-04-28 NOTE — Progress Notes (Signed)
1 Day Post-Op Procedure(s) (LRB): XI ROBOTIC ASSISTED UNITLATERAL SALPINGO OOPHORECTOMY (Right) MINI LAPAROTOMY, ILEOCECAL RESECTION, APPENDECTOMY (N/A)  Subjective: Patient reports no complaints. Has not yet ambulated or had bowel function. Tolerating sips of clears.    Objective: Vital signs in last 24 hours: Temp:  [97.2 F (36.2 C)-98.5 F (36.9 C)] 98.4 F (36.9 C) (01/20 0535) Pulse Rate:  [73-119] 95 (01/20 0535) Resp:  [15-24] 16 (01/20 0535) BP: (82-113)/(52-67) 85/54 (01/20 0535) SpO2:  [95 %-100 %] 100 % (01/20 0535) Weight:  [145 lb (65.8 kg)] 145 lb (65.8 kg) (01/19 1020)    Intake/Output from previous day: 01/19 0701 - 01/20 0700 In: 3100 [I.V.:2000; IV Piggyback:1100] Out: 10150 [Urine:700; Blood:450]  Physical Examination: General: alert and cooperative Resp: no increased work of breathing Cardio: well perfused peripheries GI: abdomen is not distended. It is soft, minimally tender. Incisions appeared dry with dressings.  Extremities: extremities normal, atraumatic, no cyanosis or edema  Labs: WBC/Hgb/Hct/Plts:  11.5/9.6/30.3/126 (01/20 0750) BUN/Cr/glu/ALT/AST/amyl/lip:  14/1.06/--/--/--/--/-- (01/20 0220)   Assessment:  30 y.o. s/p Procedure(s): XI ROBOTIC ASSISTED UNITLATERAL SALPINGO OOPHORECTOMY MINI LAPAROTOMY, ILEOCECAL RESECTION, APPENDECTOMY: stable Pain:  Pain is well-controlled on oral medications.  Heme:Anemia: acute blood loss anemia. Hypotensive - mildly. Hb not in range where transfusion necessary, therefore will treat hypotension with IVF bolus rather than blood at this time.  CV: Hypotension: secondary to volume depletion and anemia. Will given 1L IVF bolus.   GI:  Tolerating po: Yes   But has not tried much PO. Has ileal colic anastamosis. At risk for ileus. Regular diet for now.   FEN: no BMP ordered for today. BMP checked last night and downward trend in creatinine. No replacement necessary. Will order labs for  tomorrow.  Prophylaxis: pharmacologic prophylaxis (with any of the following: enoxaparin (Lovenox) 40mg  SQ 2 hours prior to surgery then every day).  Plan: Advance diet Encourage ambulation Advance to PO medication continue IVF's. Watch BP and urine output as measures of volume status. Recheck labs in am.  Dispo:  Discharge plan to include :consults: @CM @, Social Work The patient is to be discharged to home anticipate on day 3-5 postop.   LOS: 1 day    Brandy Knight 04/28/2020, 10:13 AM

## 2020-04-28 NOTE — Telephone Encounter (Signed)
ENCOUNTER OPENED IN ERROR

## 2020-04-29 LAB — CBC
HCT: 25.1 % — ABNORMAL LOW (ref 36.0–46.0)
HCT: 26.9 % — ABNORMAL LOW (ref 36.0–46.0)
Hemoglobin: 8 g/dL — ABNORMAL LOW (ref 12.0–15.0)
Hemoglobin: 8.4 g/dL — ABNORMAL LOW (ref 12.0–15.0)
MCH: 25.4 pg — ABNORMAL LOW (ref 26.0–34.0)
MCH: 25.7 pg — ABNORMAL LOW (ref 26.0–34.0)
MCHC: 31.9 g/dL (ref 30.0–36.0)
MCHC: 31.2 g/dL (ref 30.0–36.0)
MCV: 79.7 fL — ABNORMAL LOW (ref 80.0–100.0)
MCV: 82.3 fL (ref 80.0–100.0)
Platelets: 126 10*3/uL — ABNORMAL LOW (ref 150–400)
Platelets: 148 10*3/uL — ABNORMAL LOW (ref 150–400)
RBC: 3.15 MIL/uL — ABNORMAL LOW (ref 3.87–5.11)
RBC: 3.27 MIL/uL — ABNORMAL LOW (ref 3.87–5.11)
RDW: 14.4 % (ref 11.5–15.5)
RDW: 14.4 % (ref 11.5–15.5)
WBC: 8.6 10*3/uL (ref 4.0–10.5)
WBC: 8.6 10*3/uL (ref 4.0–10.5)
nRBC: 0 % (ref 0.0–0.2)
nRBC: 0 % (ref 0.0–0.2)

## 2020-04-29 LAB — BASIC METABOLIC PANEL
Anion gap: 4 — ABNORMAL LOW (ref 5–15)
BUN: 7 mg/dL (ref 6–20)
CO2: 23 mmol/L (ref 22–32)
Calcium: 7.6 mg/dL — ABNORMAL LOW (ref 8.9–10.3)
Chloride: 105 mmol/L (ref 98–111)
Creatinine, Ser: 0.8 mg/dL (ref 0.44–1.00)
GFR, Estimated: >60 mL/min (ref >60)
Glucose, Bld: 99 mg/dL (ref 70–99)
Potassium: 4.5 mmol/L (ref 3.5–5.1)
Sodium: 132 mmol/L — ABNORMAL LOW (ref 135–145)

## 2020-04-29 LAB — CYTOLOGY - PAP
Comment: NEGATIVE
Diagnosis: NEGATIVE
Diagnosis: REACTIVE
High risk HPV: NEGATIVE

## 2020-04-29 MED ORDER — ENOXAPARIN SODIUM 40 MG/0.4ML ~~LOC~~ SOLN: 40.0000 mg | SUBCUTANEOUS | 0 refills | Status: DC

## 2020-04-29 NOTE — Discharge Summary (Signed)
Physician Discharge Summary  Patient ID: SHAUNESSY DOBRATZ MRN: [id number] DOB/AGE: [birth year] 30 y.o.  Admit date: 04/27/2020 Discharge date: 04/29/2020  Admission Diagnoses: Pelvic mass in female  Discharge Diagnoses:  Principal Problem:   Pelvic mass in female   Discharged Condition:  The patient is in good condition and stable for discharge.    Hospital Course: On 04/27/2020, the patient underwent the following: Procedure(s): XI ROBOTIC ASSISTED UNILATERAL SALPINGO OOPHORECTOMY, MINI LAPAROTOMY, ILEOCECAL RESECTION, APPENDECTOMY. The postoperative course was uneventful.  She was discharged to home on postoperative day 2 tolerating a regular diet, passing flatus, pain controlled, ambulating without difficulty, voiding, having bowel movements.  Consults: None  Significant Diagnostic Studies: Labs  Treatments: surgery: see above  Discharge Exam (AM assessment per Dr. Berline Lopes): Blood pressure 103/61, pulse 81, temperature (!) 97.5 F (36.4 C), temperature source Oral, resp. rate 16, height 5\' 2"  (1.575 m), weight 145 lb (65.8 kg), SpO2 100 %, unknown if currently breastfeeding. Gen: NAD CV: regular rate and rhythm, no murmurs or rubs  Pulm: decreased breath sounds at right lung base, otherwise CTAB; no wheezes or rhonchi Abd: nondistended, appropriately ttp, soft, incisions with dermabond, honeycomb over laparotomy, no erythema or exudate, good bowel sounds Ext: no edema, no calf ttp   Disposition: Discharge disposition: 01-Home or Self Care       Discharge Instructions    Call MD for:  difficulty breathing, headache or visual disturbances   Complete by: As directed    Call MD for:  extreme fatigue   Complete by: As directed    Call MD for:  hives   Complete by: As directed    Call MD for:  persistant dizziness or light-headedness   Complete by: As directed    Call MD for:  persistant nausea and vomiting   Complete by: As directed    Call MD for:  redness, tenderness,  or signs of infection (pain, swelling, redness, odor or green/yellow discharge around incision site)   Complete by: As directed    Call MD for:  severe uncontrolled pain   Complete by: As directed    Call MD for:  temperature >100.4   Complete by: As directed    Diet - low sodium heart healthy   Complete by: As directed    Discharge wound care:   Complete by: As directed    You can remove the white honeycomb dressing from your incision 5 days after surgery and you do not need to reapply a new dressing   Driving Restrictions   Complete by: As directed    No driving for 2 weeks.  Do not take narcotics and drive.   Increase activity slowly   Complete by: As directed    Lifting restrictions   Complete by: As directed    No lifting greater than 10 lbs.   Sexual Activity Restrictions   Complete by: As directed    No sexual activity, nothing in the vagina, for 4 weeks.     Allergies as of 04/29/2020   No Known Allergies     Medication List    TAKE these medications   enoxaparin 40 MG/0.4ML injection Commonly known as: LOVENOX Inject 0.4 mLs (40 mg total) into the skin daily for 26 doses. Start taking on: April 30, 2020   ibuprofen 800 MG tablet Commonly known as: ADVIL Take 1 tablet (800 mg total) by mouth every 8 (eight) hours as needed. What changed: Another medication with the same name was removed. Continue taking this  medication, and follow the directions you see here.   oxycodone 5 MG capsule Commonly known as: OXY-IR Take 1 capsule (5 mg total) by mouth every 6 (six) hours as needed for up to 10 doses.   senna 8.6 MG Tabs tablet Commonly known as: SENOKOT Take 1 tablet (8.6 mg total) by mouth daily as needed for up to 15 doses for mild constipation.            Discharge Care Instructions  (From admission, onward)         Start     Ordered   04/29/20 0000  Discharge wound care:       Comments: You can remove the white honeycomb dressing from your incision  5 days after surgery and you do not need to reapply a new dressing   04/29/20 1639          Follow-up Information    Lafonda Mosses, MD.   Specialty: Gynecologic Oncology Why: 2/9 as scheduled Contact information: [address] [place] [address] [phone]               Greater than thirty minutes were spend for face to face discharge instructions and discharge orders/summary in EPIC.   Signed: Dorothyann Gibbs 04/29/2020, 4:41 PM

## 2020-04-29 NOTE — Progress Notes (Signed)
Discharge instructions given to patient and all questions were answered. Patient taught how to give herself Lovenox injections and all questions were answered.

## 2020-04-29 NOTE — Discharge Instructions (Signed)
04/29/2020  Return to work: 6 weeks  STARTING TOMORROW, YOU WILL NEED TO GIVE YOURSELF ONCE DAILY LOVENOX INJECTIONS FOR THE NEXT 26 DAYS TO PREVENT BLOOD CLOTS AFTER SURGERY. TRY TO GIVE THE INJECTION AT THE SAME TIME EVERYDAY. YOU ONLY NEED TO GIVE THE INJECTION ONE TIME EACH DAY.  Activity: 1. Be up and out of the bed during the day.  Take a nap if needed.  You may walk up steps but be careful and use the hand rail.  Stair climbing will tire you more than you think, you may need to stop part way and rest.   2. No lifting or straining for 6 weeks (nothing more than 10-15 lbs).  3. No driving until you are off narcotics and can brake safely. For most people, this is 5 days to 2 weeks.  Do Not drive if you are taking narcotic pain medicine.  4. Shower daily.  Use soap and water on your incision and pat dry; don't rub.   5. No sexual activity and nothing in the vagina for 8 weeks.  Medications:  - Take ibuprofen and tylenol first line for pain control. Take these regularly (every 6 hours) to decrease the build up of pain.  - If necessary, for severe pain not relieved by ibuprofen, take oxycodone.  - While taking oxycodone you should take sennakot every night to reduce the likelihood of constipation. If this causes diarrhea, stop its use.  Diet: 1. Low sodium Heart Healthy Diet is recommended.  Wound Care: 1. Keep clean and dry.  Shower daily.  Reasons to call the Doctor:   Fever - Oral temperature greater than 100.4 degrees Fahrenheit  Foul-smelling vaginal discharge  Difficulty urinating  Nausea and vomiting  Increased pain at the site of the incision that is unrelieved with pain medicine.  Difficulty breathing with or without chest pain  New calf pain especially if only on one side  Sudden, continuing increased vaginal bleeding with or without clots.   Follow-up: 1. See Jeral Pinch in 3 weeks (on 05/19/19).  Contacts: For questions or concerns you should  contact:  Dr. Jeral Pinch at [phone]  After hours and on week-ends call [phone] and ask to speak to the physician on call for Gynecologic Oncology  Enoxaparin injection What is this medicine? ENOXAPARIN (ee nox a PA rin) is used after knee, hip, or abdominal surgeries to prevent blood clotting. It is also used to treat existing blood clots in the lungs or in the veins. This medicine may be used for other purposes; ask your health care provider or pharmacist if you have questions. COMMON BRAND NAME(S): Lovenox What should I tell my health care provider before I take this medicine? They need to know if you have any of these conditions:  bleeding disorders, hemorrhage, or hemophilia  infection of the heart or heart valves  kidney or liver disease  previous stroke  prosthetic heart valve  recent surgery or delivery of a baby  ulcer in the stomach or intestine, diverticulitis, or other bowel disease  an unusual or allergic reaction to enoxaparin, heparin, pork or pork products, other medicines, foods, dyes, or preservatives  pregnant or trying to get pregnant  breast-feeding How should I use this medicine? This medicine is for injection under the skin. It is usually given by a health-care professional. You or a family member may be trained on how to give the injections. If you are to give yourself injections, make sure you understand how to  use the syringe, measure the dose if necessary, and give the injection. To avoid bruising, do not rub the site where this medicine has been injected. Do not take your medicine more often than directed. Do not stop taking except on the advice of your doctor or health care professional. Make sure you receive a puncture-resistant container to dispose of the needles and syringes once you have finished with them. Do not reuse these items. Return the container to your doctor or health care professional for proper disposal. Talk to your  pediatrician regarding the use of this medicine in children. Special care may be needed. Overdosage: If you think you have taken too much of this medicine contact a poison control center or emergency room at once. NOTE: This medicine is only for you. Do not share this medicine with others. What if I miss a dose? If you miss a dose, take it as soon as you can. If it is almost time for your next dose, take only that dose. Do not take double or extra doses. What may interact with this medicine?  aspirin and aspirin-like medicines  certain medicines that treat or prevent blood clots  dipyridamole  NSAIDs, medicines for pain and inflammation, like ibuprofen or naproxen This list may not describe all possible interactions. Give your health care provider a list of all the medicines, herbs, non-prescription drugs, or dietary supplements you use. Also tell them if you smoke, drink alcohol, or use illegal drugs. Some items may interact with your medicine. What should I watch for while using this medicine? Visit your healthcare professional for regular checks on your progress. You may need blood work done while you are taking this medicine. Your condition will be monitored carefully while you are receiving this medicine. It is important not to miss any appointments. If you are going to need surgery or other procedure, tell your healthcare professional that you are using this medicine. Using this medicine for a long time may weaken your bones and increase the risk of bone fractures. Avoid sports and activities that might cause injury while you are using this medicine. Severe falls or injuries can cause unseen bleeding. Be careful when using sharp tools or knives. Consider using an Copy. Take special care brushing or flossing your teeth. Report any injuries, bruising, or red spots on the skin to your healthcare professional. Wear a medical ID bracelet or chain. Carry a card that describes your  disease and details of your medicine and dosage times. What side effects may I notice from receiving this medicine? Side effects that you should report to your doctor or health care professional as soon as possible:  allergic reactions like skin rash, itching or hives, swelling of the face, lips, or tongue  bone pain  signs and symptoms of bleeding such as bloody or black, tarry stools; red or dark-brown urine; spitting up blood or brown material that looks like coffee grounds; red spots on the skin; unusual bruising or bleeding from the eye, gums, or nose  signs and symptoms of a blood clot such as chest pain; shortness of breath; pain, swelling, or warmth in the leg  signs and symptoms of a stroke such as changes in vision; confusion; trouble speaking or understanding; severe headaches; sudden numbness or weakness of the face, arm or leg; trouble walking; dizziness; loss of coordination Side effects that usually do not require medical attention (report to your doctor or health care professional if they continue or are bothersome):  hair loss  pain, redness, or irritation at site where injected This list may not describe all possible side effects. Call your doctor for medical advice about side effects. You may report side effects to FDA at 1-800-FDA-1088. Where should I keep my medicine? Keep out of the reach of children. Store at room temperature between 15 and 30 degrees C (59 and 86 degrees F). Do not freeze. If your injections have been specially prepared, you may need to store them in the refrigerator. Ask your pharmacist. Throw away any unused medicine after the expiration date. NOTE: This sheet is a summary. It may not cover all possible information. If you have questions about this medicine, talk to your doctor, pharmacist, or health care provider.  2021 Elsevier/Gold Standard (2017-03-21 11:25:34)

## 2020-04-29 NOTE — Progress Notes (Signed)
2 Days Post-Op Procedure(s) (LRB): XI ROBOTIC ASSISTED UNITLATERAL SALPINGO OOPHORECTOMY (Right) MINI LAPAROTOMY, ILEOCECAL RESECTION, APPENDECTOMY (N/A)  Subjective: Patient reports doing well. Has some abdominal pain when getting up out of bed, otherwise denies significant pain. Tolerating PO without nausea or emesis. + flatus, had small BM yesterday. Voiding without difficulty (endorses voiding a lot more than she has been recently). Denies dizziness or lightheadedness with ambulation.    Objective: Vital signs in last 24 hours: Temp:  [97.8 F (36.6 C)-99 F (37.2 C)] 98.9 F (37.2 C) (01/21 0544) Pulse Rate:  [78-88] 88 (01/21 0544) Resp:  [15-16] 16 (01/21 0544) BP: (83-102)/(53-60) 102/57 (01/21 0544) SpO2:  [100 %] 100 % (01/21 0544) Last BM Date: 04/23/20  Intake/Output from previous day: 01/20 0701 - 01/21 0700 In: 5099.1 [P.O.:660; I.V.:3457.6; IV Piggyback:981.5] Out: 1150 [Urine:1100; Stool:50]  Physical Examination: Gen: NAD CV: regular rate and rhythm, no murmurs or rubs  Pulm: decreased breath sounds at right lung base, otherwise CTAB; no wheezes or rhonchi Abd: nondistended, appropriately ttp, soft, incisions with dermabond, honeycomb over laparotomy, no erythema or exudate, good bowel sounds Ext: no edema, no calf ttp  Labs: WBC/Hgb/Hct/Plts:  8.6/8.4/26.9/148 (01/21 2536) BUN/Cr/glu/ALT/AST/amyl/lip:  7/0.80/--/--/--/--/-- (01/21 6440)   Assessment:  30 y.o. s/p Procedure(s): XI ROBOTIC ASSISTED UNITLATERAL SALPINGO OOPHORECTOMY MINI LAPAROTOMY, ILEOCECAL RESECTION, APPENDECTOMY: meeting post-operative milestones. Pain:  Pain well-controlled on oral medications.  Heme: anemic but asymptomatic. Likely multifactorial with significant intra-vascular depletion coming into surgery (suspect pre-op H&H elevated due to this), large fluid repletion since surgery, and surgical blood loss. H&H repeated this morning and stable.   GI:  Tolerating po: Yes   Pt without  signs of ileus, continues to have bowel function.   FEN: IVFs discontinued. Regular diet ordered along with nutritional supplements.   Prophylaxis: lovenox sq  Plan: Continue routine post-op care. Possible dc later today vs tomorrow pending continued bowel function. The patient is to be discharged to home. 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 Will await final pathology and then finalize treatment plan.   LOS: 2 days    Lafonda Mosses 04/29/2020, 9:53 AM

## 2020-04-29 NOTE — Plan of Care (Signed)
  Problem: Health Behavior/Discharge Planning: Goal: Ability to manage health-related needs will improve Outcome: Progressing   

## 2020-05-03 ENCOUNTER — Telehealth: Payer: Self-pay

## 2020-05-03 NOTE — Telephone Encounter (Signed)
Brandy Knight states that she is eating,drinking, and urinating well. Passing gas. She had a goo BM Today. Afebrile. Incisions D&I.  She will remove the Honey comb dressing tomorrow after she showers. Doing well with Lovenox  Injections. Pt aware of post op appointments as well as the office number [phone] and after hours number [phone] to call if she has any questions or concerns

## 2020-05-04 LAB — SURGICAL PATHOLOGY

## 2020-05-04 LAB — CYTOLOGY - NON PAP

## 2020-05-05 ENCOUNTER — Encounter: Payer: Self-pay | Admitting: Gynecologic Oncology

## 2020-05-05 ENCOUNTER — Telehealth: Payer: Self-pay | Admitting: Gynecologic Oncology

## 2020-05-05 DIAGNOSIS — C562 Malignant neoplasm of left ovary: Secondary | ICD-10-CM | POA: Insufficient documentation

## 2020-05-05 DIAGNOSIS — D3911 Neoplasm of uncertain behavior of right ovary: Secondary | ICD-10-CM | POA: Insufficient documentation

## 2020-05-05 NOTE — Telephone Encounter (Signed)
Called and spoke to the patient regarding final pathology from surgery last week. 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 She continues to do well after surgery. Given increased risk of recurrence with stage IC disease, I favor some adjuvant systemic therapy. I reviewed this risk with the patient. Will plan to discuss at tumor board.  Jeral Pinch MD Gynecologic Oncology

## 2020-05-06 ENCOUNTER — Telehealth: Payer: Self-pay | Admitting: Oncology

## 2020-05-06 DIAGNOSIS — D3911 Neoplasm of uncertain behavior of right ovary: Secondary | ICD-10-CM

## 2020-05-06 NOTE — Telephone Encounter (Signed)
Brandy Knight with appointment to see Dr. Alvy Bimler on 05/18/20 at 2:00.  She verbalized understanding and agreement.

## 2020-05-10 ENCOUNTER — Telehealth: Payer: Self-pay | Admitting: *Deleted

## 2020-05-10 NOTE — Telephone Encounter (Signed)
Called and moved the patient's appt from 2/9 to 2/8

## 2020-05-17 ENCOUNTER — Inpatient Hospital Stay (HOSPITAL_BASED_OUTPATIENT_CLINIC_OR_DEPARTMENT_OTHER): Payer: Medicaid Other | Admitting: Gynecologic Oncology

## 2020-05-17 ENCOUNTER — Encounter: Payer: Self-pay | Admitting: Gynecologic Oncology

## 2020-05-17 ENCOUNTER — Other Ambulatory Visit: Payer: Self-pay

## 2020-05-17 VITALS — BP 121/76 | HR 63 | Temp 97.4°F | Resp 18 | Ht 62.0 in | Wt 127.2 lb

## 2020-05-17 DIAGNOSIS — Z23 Encounter for immunization: Secondary | ICD-10-CM | POA: Insufficient documentation

## 2020-05-17 DIAGNOSIS — Z90721 Acquired absence of ovaries, unilateral: Secondary | ICD-10-CM | POA: Insufficient documentation

## 2020-05-17 DIAGNOSIS — D3911 Neoplasm of uncertain behavior of right ovary: Secondary | ICD-10-CM

## 2020-05-17 NOTE — Patient Instructions (Signed)
You are healing well from surgery!  Remember no lifting more than 10-15 pounds until 6 weeks after surgery.  I will see you back for a visit after you finish treatment with Dr. Alvy Bimler.

## 2020-05-17 NOTE — Progress Notes (Signed)
Gynecologic Oncology Return Clinic Visit  05/17/20  Reason for Visit: post-op follow-up  Treatment History: Oncology History Overview Note  1/14: inhibin B - 3,543 Inhibin A - 16  Presented initially on 04/21/20 with suspected pregnancy, increasing abdominal growth. 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   Granulosa cell tumor of ovary, right  04/21/2020 Imaging   CT A/P: 1. 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 Ovarian lesion is typically a fibroma in this setting. There is a cleavage plane between the adnexal mass and the uterus, which makes a pedunculated fibroid unlikely. 2. There is a probable nabothian cyst in the cervix. 3. Ascites causes mass effect on the abdominopelvic structures   04/22/2020 Procedure   Paracentesis, 9L  FINAL MICROSCOPIC DIAGNOSIS:  - Atypical cells present  - See comment.    04/23/2020 Procedure   Thoracentesis, 2L  FINAL MICROSCOPIC DIAGNOSIS:  - Reactive mesothelial cells present   04/27/2020 Surgery   Robotic-assisted lysis of adhesions, left salpingoo-oophorectomy, laparotomy for specimen removal, ileocecal resection and reanastomosis, appendectomy, omentectomy  Findings: On EUA, small mobile uterus with mass that moves in junction with the uterus, sitting out of the pelvis and palpable with the abdominal hand. Mass is firm and adherent to the anterior abdominal wall. On intra-abdominal entry, approximately 6L of green-tinged ascites noted and removed. Normal appearing upper abdominal survey. Normal appearing omentum. 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 zsome pockets of degeneration that are unavoidably entered during gentle manipulation of the ovary. Mass with dense adhesions to the anterior abdominal wall on the  right, the right sidewall, and several loops of ileum. Uterus 8cm and normal appearing. Right fallopian tube and ovary without evidence of disease. Some inflammatory rind noted on the small and large bowel. Given size of mass and its suspected degeneration and poor integrity, unable to place in an Endocatch bag and midline excision extended for specimen removal. Given concern for thermal damage to the terminal ileum from lyssi of dense adhesions, decision made to proceed with ileocectomy. 1-1.5cm mesenteric node palpated within the mesentery removed. Some shotty sub centimeter mesenteric nodes palpated. No para-aortic or pelvic lymphadenopathy. Liver edge, diaphragm and stomach smooth. Omentum without evidence of disease. An additional 3L of ascites was removed over the course of the surgery.   04/27/2020 Pathology Results   A. OVARY AND FALLOPIAN TUBE, LEFT, SALPINGO OOPHORECTOMY:  - Granulosa cell tumor, adult type, multiple fragments measuring 18.5 cm  in aggregate  - No evidence of ovarian surface involvement  - Benign unremarkable fallopian tube  - See oncology table   B. ILEOCECUM AND APPENDIX, RESECTION:  - Segment of terminal ileum (9 cm) and colon (10 cm) showing focal  serosal disruption  - Unremarkable appendix  - Margins appear viable  - Benign lymph nodes  - No evidence of malignancy   C. OMENTUM, RESECTION:  - Portion of omentum with congestion and mild mesothelial hyperplasia  - No evidence of malignancy   OVARY or FALLOPIAN TUBE or PRIMARY PERITONEUM: Resection   Procedure: Salpingo-oophorectomy  Specimen Integrity: Fragmented  Tumor Site: Left ovary  Tumor Size: Multiple tissue fragments measured 18.5 cm in aggregate  Histologic Type: Granulosa cell tumor, adult type  Histologic Grade: Not applicable  Ovarian Surface Involvement: Not identified  Fallopian Tube Surface Involvement: Not identified  Implants (required for advanced stage serous/seromucinous borderline  tumors only): Not applicable  Other Tissue/ Organ Involvement: Not applicable  Largest Extrapelvic Peritoneal Focus: Not identified  Peritoneal/Ascitic Fluid Involvement: Not identified (LEX51-70)  Chemotherapy Response Score (CRS): Not applicable, no known presurgical  therapy  Regional Lymph Nodes: Not applicable (no lymph nodes submitted or found)  Distant Metastasis:       Distant Site(s) Involved: Not applicable  Pathologic Stage Classification (pTNM, AJCC 8th Edition): pT1c, pN not  assigned (no nodes submitted or found).  See comment  Ancillary Studies: Can be performed upon request  Representative Tumor Block: A2  Comment(s): The tumor stage is due to intraoperative surgical spill of  the tumor cells based on discussion with the surgeon Dr. Jeral Pinch.   04/27/2020 Cancer Staging   Staging form: Ovary, Fallopian Tube, and Primary Peritoneal Carcinoma, AJCC 8th Edition - Clinical stage from 04/27/2020: FIGO Stage IC1, calculated as Stage IC (cT1c1, cN0, cM0) - Signed by Lafonda Mosses, MD on 05/05/2020   05/05/2020 Initial Diagnosis   Granulosa cell tumor of ovary, right     Interval History: The patient presents today to follow-up after surgery.  She endorses feeling much better.  Her appetite has improved significantly and she denies any early satiety.  She denies nausea or emesis.  She initially had diarrhea after surgery and is now having daily or multiple daily bowel movements that are well formed.  This is back to her baseline bowel function before she began having increased abdominal girth last year.  She denies any fevers or chills.  She has had some itching around her incisions and had a little bit of bleeding from her left upper quadrant incision.  Denies any significant abdominal pain.  Past Medical/Surgical History: Past Medical History:  Diagnosis Date  . 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 Ascites   . Medical history non-contributory   . Pelvic  mass     Past Surgical History:  Procedure Laterality Date  . IR PARACENTESIS  04/22/2020  . LAPAROTOMY N/A 04/27/2020   Procedure: MINI LAPAROTOMY, ILEOCECAL RESECTION, APPENDECTOMY;  Surgeon: Lafonda Mosses, MD;  Location: [facility];  Service: Gynecology;  Laterality: N/A;  . ROBOTIC ASSISTED SALPINGO OOPHERECTOMY Right 04/27/2020   Procedure: XI ROBOTIC ASSISTED UNITLATERAL SALPINGO OOPHORECTOMY;  Surgeon: Lafonda Mosses, MD;  Location: [facility];  Service: Gynecology;  Laterality: Right;  . THORACENTESIS    . WISDOM TOOTH EXTRACTION      Family History  Problem Relation Age of Onset  . Diabetes Mother   . Diabetes Father   . Seizures Sister   . Myasthenia gravis Sister   . ADD / ADHD Sister   . Ovarian cancer Neg Hx   . Uterine cancer Neg Hx   . Breast cancer Neg Hx   . Colon cancer Neg Hx     Social History   Socioeconomic History  . Marital status: Single    Spouse name: Not on file  . Number of children: 1  . Years of education: Not on file  . Highest education level: Not on file  Occupational History    Employer: [employer]  Tobacco Use  . Smoking status: Never Smoker  . Smokeless tobacco: Never Used  Vaping Use  . Vaping Use: Never used  Substance and Sexual Activity  . Alcohol use: No  . Drug use: No  . Sexual activity: Yes    Birth control/protection: None  Other Topics Concern  . Not on file  Social History Narrative  . Not on file  Social Determinants of Health   Financial Resource Strain: Not on file  Food Insecurity: Not on file  Transportation Needs: Not on file  Physical Activity: Not on file  Stress: Not on file  Social Connections: Not on file    Current Medications:  Current Outpatient Medications:  .  enoxaparin (LOVENOX) 40 MG/0.4ML injection, Inject 0.4 mLs (40 mg total) into the skin daily for 26 doses., Disp: 10.4 mL, Rfl: 0 .  ibuprofen (ADVIL) 800 MG tablet, Take 1 tablet (800 mg total) by mouth every 8 (eight) hours as  needed., Disp: 20 tablet, Rfl: 0 .  oxycodone (OXY-IR) 5 MG capsule, Take 1 capsule (5 mg total) by mouth every 6 (six) hours as needed for up to 10 doses., Disp: 10 capsule, Rfl: 0 .  senna (SENOKOT) 8.6 MG TABS tablet, Take 1 tablet (8.6 mg total) by mouth daily as needed for up to 15 doses for mild constipation., Disp: 15 tablet, Rfl: 0  Review of Systems: Denies appetite changes, fevers, chills, fatigue, unexplained weight changes. Denies hearing loss, neck lumps or masses, mouth sores, ringing in ears or voice changes. Denies cough or wheezing.  Denies shortness of breath. Denies chest pain or palpitations. Denies leg swelling. Denies abdominal distention, pain, blood in stools, constipation, diarrhea, nausea, vomiting, or early satiety. Denies pain with intercourse, dysuria, frequency, hematuria or incontinence. Denies hot flashes, pelvic pain, vaginal bleeding or vaginal discharge.   Denies joint pain, back pain or muscle pain/cramps. Denies itching, rash, or wounds. Denies dizziness, headaches, numbness or seizures. Denies swollen lymph nodes or glands, denies easy bruising or bleeding. Denies anxiety, depression, confusion, or decreased concentration.  Physical Exam: BP 121/76 (BP Location: Left Arm, Patient Position: Sitting)   Pulse 63   Temp (!) 97.4 F (36.3 C) (Tympanic)   Resp 18   Ht 5\' 2"  (1.575 m)   Wt 127 lb 3.2 oz (57.7 kg)   SpO2 100%   BMI 23.27 kg/m  General: Alert, oriented, no acute distress. HEENT: Normocephalic, atraumatic, sclera anicteric. Chest: Unlabored breathing on room air. Abdomen: Obese, soft, nontender.  Normoactive bowel sounds.  No  hepatosplenomegaly appreciated.  Well-healing laparoscopic and midline laparotomy incisions.  Remaining Dermabond removed.  Some keloiding of the midline laparotomy incision.  There is an approximately 1 x 2 cm firm nodule underneath the skin at the left upper quadrant incision.  This area is nontender and not  reducible. Extremities: Grossly normal range of motion.  Warm, well perfused.  No edema bilaterally.  Laboratory & Radiologic Studies: None new  Assessment & Plan: ALEJANDRA HUNT is a 30 y.o. 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  Patient is overall doing well from a postoperative standpoint.  We discussed continued activity restrictions for the next few weeks.  I suspect she has a seroma or hematoma under her left upper quadrant incision.  While this is firmer than I would expect, her lack of symptoms and pain with palpation around this area decrease my concern that this is an incisional hernia.  Patient is scheduled to see Dr. Alvy Bimler tomorrow.  Given spillage of her tumor due to necrotic degenerating mass, we discussed that her recurrence risk is doubled.  Given this increased risk, I recommend adjuvant therapy with systemic therapy.  This could be done with either carboplatin and paclitaxel versus BEP.  The patient would very much like to work at least part-time during treatment.  I think for multiple  reasons carboplatin and paclitaxel is the preferable regimen.  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  Hopefully this will resolve within 6 to 8 weeks of surgery.  We also discussed again that inhibin B was significantly elevated before surgery and this will be a tumor marker that we will follow moving forward.  28 minutes of total time was spent for this patient encounter, including preparation, face-to-face counseling with the patient and coordination of care, and documentation of the encounter.  Jeral Pinch, MD  Division of Gynecologic Oncology  Department of Obstetrics and Gynecology  [facility]

## 2020-05-18 ENCOUNTER — Other Ambulatory Visit: Payer: Self-pay

## 2020-05-18 ENCOUNTER — Encounter: Payer: Self-pay | Admitting: Oncology

## 2020-05-18 ENCOUNTER — Inpatient Hospital Stay: Payer: Medicaid Other | Attending: Gynecologic Oncology | Admitting: Hematology and Oncology

## 2020-05-18 ENCOUNTER — Telehealth: Payer: Self-pay | Admitting: Oncology

## 2020-05-18 ENCOUNTER — Encounter: Payer: Self-pay | Admitting: Hematology and Oncology

## 2020-05-18 ENCOUNTER — Inpatient Hospital Stay: Payer: Medicaid Other | Admitting: Gynecologic Oncology

## 2020-05-18 VITALS — BP 126/70 | HR 64 | Temp 98.5°F | Resp 18 | Ht 62.0 in | Wt 127.0 lb

## 2020-05-18 DIAGNOSIS — J90 Pleural effusion, not elsewhere classified: Secondary | ICD-10-CM

## 2020-05-18 DIAGNOSIS — D3911 Neoplasm of uncertain behavior of right ovary: Secondary | ICD-10-CM

## 2020-05-18 DIAGNOSIS — D279 Benign neoplasm of unspecified ovary: Secondary | ICD-10-CM

## 2020-05-18 DIAGNOSIS — Z90721 Acquired absence of ovaries, unilateral: Secondary | ICD-10-CM | POA: Diagnosis not present

## 2020-05-18 DIAGNOSIS — Z299 Encounter for prophylactic measures, unspecified: Secondary | ICD-10-CM | POA: Diagnosis not present

## 2020-05-18 DIAGNOSIS — D539 Nutritional anemia, unspecified: Secondary | ICD-10-CM | POA: Insufficient documentation

## 2020-05-18 DIAGNOSIS — Z23 Encounter for immunization: Secondary | ICD-10-CM | POA: Diagnosis not present

## 2020-05-18 DIAGNOSIS — R188 Other ascites: Secondary | ICD-10-CM

## 2020-05-18 MED ORDER — INFLUENZA VAC SPLIT QUAD 0.5 ML IM SUSY
PREFILLED_SYRINGE | INTRAMUSCULAR | Status: AC
  Filled 2020-05-18: qty 0.5

## 2020-05-18 MED ORDER — INFLUENZA VAC SPLIT QUAD 0.5 ML IM SUSY
0.5000 mL | PREFILLED_SYRINGE | Freq: Once | INTRAMUSCULAR | Status: AC
  Administered 2020-05-18: 0.5 mL via INTRAMUSCULAR

## 2020-05-18 MED ORDER — PROCHLORPERAZINE MALEATE 10 MG PO TABS: 10.0000 mg | ORAL_TABLET | Freq: Four times a day (QID) | ORAL | 1 refills | Status: DC | PRN

## 2020-05-18 MED ORDER — ONDANSETRON HCL 8 MG PO TABS: 8.0000 mg | ORAL_TABLET | Freq: Three times a day (TID) | ORAL | 1 refills | Status: DC | PRN

## 2020-05-18 MED ORDER — DEXAMETHASONE 4 MG PO TABS: ORAL_TABLET | ORAL | 6 refills | Status: DC

## 2020-05-18 NOTE — Progress Notes (Addendum)
START ON PATHWAY REGIMEN - Ovarian     A cycle is every 21 days:     Paclitaxel      Carboplatin   **Always confirm dose/schedule in your pharmacy ordering system**  Patient Characteristics: Postoperative without Neoadjuvant Therapy (Pathologic Staging), Newly Diagnosed, Adjuvant Therapy, Stage IC, Grade 1 or 2 BRCA Mutation Status: Awaiting Test Results Therapeutic Status: Postoperative without Neoadjuvant Therapy (Pathologic Staging) AJCC 8 Stage Grouping: IC AJCC M Category: cM0 AJCC T Category: pT1c AJCC N Category: pN0 Tumor Grade: 2 Intent of Therapy: Curative Intent, Discussed with Patient 

## 2020-05-18 NOTE — Assessment & Plan Note (Signed)
Clinically, her pleural effusion has resolved I do not plan to repeat CT imaging until she has completed her treatment

## 2020-05-18 NOTE — Progress Notes (Addendum)
[place] CONSULT NOTE  Patient Care Team: Patient, No Pcp Per as PCP - General (General Practice)  ASSESSMENT & PLAN:  Granulosa cell tumor of ovary, right We will get her case review at the next GYN oncology tumor board Given her young age, she would benefit from genetic counseling and genetic testing We discussed the role of treatment.  We reviewed the NCCN guidelines We discussed the role of chemotherapy. The intent is of curative intent.  We discussed some of the risks, benefits, side-effects of carboplatin & Taxol. Treatment is intravenous, every 3 weeks x 6 cycles  Some of the short term side-effects included, though not limited to, including weight loss, life threatening infections, risk of allergic reactions, need for transfusions of blood products, nausea, vomiting, change in bowel habits, loss of hair, admission to hospital for various reasons, and risks of death.   Long term side-effects are also discussed including risks of infertility, permanent damage to nerve function, hearing loss, chronic fatigue, kidney damage with possibility needing hemodialysis, and rare secondary malignancy including bone marrow disorders.  The patient is aware that the response rates discussed earlier is not guaranteed.  After a long discussion, patient made an informed decision to proceed with the prescribed plan of care.   Patient education material was dispensed. We discussed premedication with dexamethasone before chemotherapy. 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 She does not have any prior allergic reactions to influenza vaccination. She agrees to proceed with influenza vaccination today and we will administer it today at the clinic.    Orders Placed This Encounter  Procedures  . CBC with Differential (Cancer Center Only)    Standing Status:   Standing    Number of Occurrences:   20    Standing Expiration Date:   05/18/2021  . CMP ([facility] only)    Standing Status:   Standing    Number of Occurrences:   20    Standing Expiration Date:   05/18/2021  . Pregnancy, urine    Standing Status:   Standing    Number of Occurrences:   9    Standing Expiration Date:   05/18/2021  . Iron and TIBC    Standing Status:   Future    Standing Expiration Date:   05/18/2021  . Ferritin    Standing Status:   Future    Standing Expiration Date:   05/18/2021  . Vitamin B12    Standing Status:   Future    Standing Expiration Date:   05/18/2021  . Inhibin B    Standing Status:   Standing    Number of Occurrences:   4    Standing Expiration Date:   05/18/2021    The total time spent in the  appointment was 60 minutes encounter with patients including review of chart and various tests results, discussions about plan of care and coordination of care plan   All questions were answered. The patient knows to call the clinic with any problems, questions or concerns. No barriers to learning was detected.  Heath Lark, MD 2/9/20222:53 PM  CHIEF COMPLAINTS/PURPOSE OF CONSULTATION:  Granulosa cell tumor status post resection, for adjuvant treatment  HISTORY OF PRESENTING ILLNESS:  Brandy Knight 30 y.o. female is here  because of recent diagnosis of granulosa cell tumor of the ovary Patient is here alone by herself She is single but lives with her boyfriend She has a 73-year-old son at home She has no known family history of cancer She started to have abdominal bloating, irregular menstruation and gradual abdominal distention for about 6 months prior to her diagnosis Subsequently, she presented to the emergency department for evaluation because she was feeling unwell and CT imaging show significant large ovarian mass along with pleural effusion She underwent thoracentesis and paracentesis and subsequently referred to GYN surgeon for resection  I have reviewed her chart and materials related to her cancer extensively and collaborated history with the patient. Summary of oncologic history is as follows: Oncology History Overview Note  1/14: inhibin B - 3,543 Inhibin A - 60  Presented initially on 04/21/20 with suspected pregnancy, increasing abdominal growth. 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   Granulosa cell tumor of ovary, right  04/21/2020 Imaging   CT A/P: 1. 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 Ovarian lesion is typically a fibroma in this setting. There is a cleavage plane between the adnexal mass and the uterus, which makes a pedunculated fibroid unlikely. 2. There is a probable nabothian cyst in the cervix. 3. Ascites causes mass effect on the abdominopelvic structures   04/22/2020 Procedure   Paracentesis, 9L  FINAL MICROSCOPIC DIAGNOSIS:  - Atypical cells present  - See comment.    04/23/2020 Procedure   Thoracentesis, 2L  FINAL MICROSCOPIC DIAGNOSIS:  - Reactive mesothelial cells present   04/27/2020 Surgery   Robotic-assisted lysis of adhesions, left salpingoo-oophorectomy, laparotomy for specimen  removal, ileocecal resection and reanastomosis, appendectomy, omentectomy  Findings: On EUA, small mobile uterus with mass that moves in junction with the uterus, sitting out of the pelvis and palpable with the abdominal hand. Mass is firm and adherent to the anterior abdominal wall. On intra-abdominal entry, approximately 6L of green-tinged ascites noted and removed. Normal appearing upper abdominal survey. Normal appearing omentum. 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 zsome pockets of degeneration that are unavoidably entered during gentle manipulation of the ovary. Mass with dense adhesions to the anterior abdominal wall on the right, the right sidewall, and several loops of ileum. Uterus 8cm and normal appearing. Right fallopian tube and ovary without evidence of disease. Some inflammatory rind noted on the small and large bowel. Given size of mass and its suspected degeneration and poor integrity, unable to place in an Endocatch bag and midline excision extended for specimen removal. Given concern for thermal damage to the terminal ileum from lyssi of dense adhesions, decision made to proceed with ileocectomy. 1-1.5cm mesenteric node palpated within the mesentery removed. Some shotty sub centimeter mesenteric nodes palpated. No para-aortic or pelvic lymphadenopathy. Liver edge, diaphragm and stomach smooth. Omentum without evidence of disease. An  additional 3L of ascites was removed over the course of the surgery.   04/27/2020 Pathology Results   A. OVARY AND FALLOPIAN TUBE, LEFT, SALPINGO OOPHORECTOMY:  - Granulosa cell tumor, adult type, multiple fragments measuring 18.5 cm  in aggregate  - No evidence of ovarian surface involvement  - Benign unremarkable fallopian tube  - See oncology table   B. ILEOCECUM AND APPENDIX, RESECTION:  - Segment of terminal ileum (9 cm) and colon (10 cm) showing focal  serosal disruption  - Unremarkable appendix  -  Margins appear viable  - Benign lymph nodes  - No evidence of malignancy   C. OMENTUM, RESECTION:  - Portion of omentum with congestion and mild mesothelial hyperplasia  - No evidence of malignancy   OVARY or FALLOPIAN TUBE or PRIMARY PERITONEUM: Resection   Procedure: Salpingo-oophorectomy  Specimen Integrity: Fragmented  Tumor Site: Left ovary  Tumor Size: Multiple tissue fragments measured 18.5 cm in aggregate  Histologic Type: Granulosa cell tumor, adult type  Histologic Grade: Not applicable  Ovarian Surface Involvement: Not identified  Fallopian Tube Surface Involvement: Not identified  Implants (required for advanced stage serous/seromucinous borderline  tumors only): Not applicable  Other Tissue/ Organ Involvement: Not applicable  Largest Extrapelvic Peritoneal Focus: Not identified  Peritoneal/Ascitic Fluid Involvement: Not identified ([id number])  Chemotherapy Response Score (CRS): Not applicable, no known presurgical  therapy  Regional Lymph Nodes: Not applicable (no lymph nodes submitted or found)  Distant Metastasis:       Distant Site(s) Involved: Not applicable  Pathologic Stage Classification (pTNM, AJCC 8th Edition): pT1c, pN not  assigned (no nodes submitted or found).  See comment  Ancillary Studies: Can be performed upon request  Representative Tumor Block: A2  Comment(s): The tumor stage is due to intraoperative surgical spill of  the tumor cells based on discussion with the surgeon Dr. Jeral Pinch.   04/27/2020 Cancer Staging   Staging form: Ovary, Fallopian Tube, and Primary Peritoneal Carcinoma, AJCC 8th Edition - Clinical stage from 04/27/2020: FIGO Stage IC1, calculated as Stage IC (cT1c1, cN0, cM0) - Signed by Lafonda Mosses, MD on 05/05/2020   05/05/2020 Initial Diagnosis   Granulosa cell tumor of ovary, right   05/30/2020 -  Chemotherapy    Patient is on Treatment Plan: OVARIAN CARBOPLATIN (AUC 6) / PACLITAXEL (175) Q21D X 6 CYCLES        She is feeling well postoperatively.  She has lost a lot of weight Appetite is good She denies recent constipation or nausea Her abdominal distention has improved/resolved She denies pain  MEDICAL HISTORY:  Past Medical History:  Diagnosis Date  . 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 Ascites   . Medical history non-contributory   . Pelvic mass     SURGICAL HISTORY: Past Surgical History:  Procedure Laterality Date  . IR PARACENTESIS  04/22/2020  . LAPAROTOMY N/A 04/27/2020   Procedure: MINI LAPAROTOMY, ILEOCECAL RESECTION, APPENDECTOMY;  Surgeon: Lafonda Mosses, MD;  Location: [facility];  Service: Gynecology;  Laterality: N/A;  . ROBOTIC ASSISTED SALPINGO OOPHERECTOMY Right 04/27/2020   Procedure: XI ROBOTIC ASSISTED UNITLATERAL SALPINGO OOPHORECTOMY;  Surgeon: Lafonda Mosses, MD;  Location: [facility];  Service: Gynecology;  Laterality: Right;  . THORACENTESIS    . WISDOM TOOTH EXTRACTION      SOCIAL HISTORY: Social History   Socioeconomic History  . Marital status: Single    Spouse name: Not on file  . Number of children: 1  . Years of education: Not on file  .  Highest education level: Not on file  Occupational History    Employer: [employer]  Tobacco Use  . Smoking status: Never Smoker  . Smokeless tobacco: Never Used  Vaping Use  . Vaping Use: Never used  Substance and Sexual Activity  . Alcohol use: No  . Drug use: No  . Sexual activity: Yes    Birth control/protection: None  Other Topics Concern  . 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 Diabetes Mother   . Diabetes Father   .  Seizures Sister   . Myasthenia gravis Sister   . ADD / ADHD Sister   . Ovarian cancer Neg Hx   . Uterine cancer Neg Hx   . Breast cancer Neg Hx   . Colon cancer Neg Hx     ALLERGIES:  has No Known Allergies.  MEDICATIONS:  Current Outpatient Medications  Medication Sig Dispense Refill  . dexamethasone (DECADRON) 4 MG tablet Take 2 tabs at the night before and 2 tab the morning of chemotherapy, every 3 weeks, by mouth x 6 cycles 36 tablet 6  . ondansetron (ZOFRAN) 8 MG tablet Take 1 tablet (8 mg total) by mouth every 8 (eight) hours as needed for refractory nausea / vomiting. Start on day 3 after carboplatin chemo. 30 tablet 1  . prochlorperazine (COMPAZINE) 10 MG tablet Take 1 tablet (10 mg total) by mouth every 6 (six) hours as needed (Nausea or vomiting). 30 tablet 1   No current facility-administered medications for this visit.    REVIEW OF SYSTEMS:   Constitutional: Denies fevers, chills or abnormal night sweats Eyes: Denies blurriness of vision, double vision or watery eyes Ears, nose, mouth, throat, and face: Denies mucositis or sore throat Respiratory: Denies cough, dyspnea or wheezes Cardiovascular: Denies palpitation, chest discomfort or lower extremity swelling Gastrointestinal:  Denies nausea, heartburn or change in bowel habits Skin: Denies abnormal skin rashes Lymphatics: Denies new lymphadenopathy or easy bruising Neurological:Denies numbness, tingling or new weaknesses Behavioral/Psych: Mood is stable, no new changes  All other systems were reviewed with the patient and are negative.  PHYSICAL EXAMINATION: ECOG PERFORMANCE STATUS: 1 - Symptomatic but completely ambulatory  Vitals:   05/18/20 1404  BP: 126/70  Pulse: 64  Resp: 18  Temp: 98.5 F (36.9 C)  SpO2: 100%   Filed Weights   05/18/20 1404  Weight: 127 lb (57.6 kg)    GENERAL:alert, no distress and comfortable SKIN: skin color, texture, turgor are normal, no rashes or significant lesions EYES:  normal, conjunctiva are pink and non-injected, sclera clear OROPHARYNX:no exudate, no erythema and lips, buccal mucosa, and tongue normal  NECK: supple, thyroid normal size, non-tender, without nodularity LYMPH:  no palpable lymphadenopathy in the cervical, axillary or inguinal LUNGS: clear to auscultation and percussion with normal breathing effort HEART: regular rate & rhythm and no murmurs and no lower extremity edema ABDOMEN:abdomen soft, non-tender and normal bowel sounds.  Noted well-healed surgical scar Musculoskeletal:no cyanosis of digits and no clubbing  PSYCH: alert & oriented x 3 with fluent speech NEURO: no focal motor/sensory deficits  LABORATORY DATA:  I  have reviewed the data as listed Lab Results  Component Value Date   WBC 8.6 04/29/2020   HGB 8.4 (L) 04/29/2020   HCT 26.9 (L) 04/29/2020   MCV 82.3 04/29/2020   PLT 148 (L) 04/29/2020   Recent Labs    04/21/20 1655 04/22/20 0303 04/27/20 1042 04/28/20 0220 04/29/20 0513  NA 130*   < > 129* 129* 132*  K 5.7*   < > 4.9 4.9 4.5  CL 95*   < > 94* 98 105  CO2 22   < > 23 23 23   GLUCOSE 116*   < > 137* 138* 99  BUN 9   < > 17 14 7   CREATININE 1.05*   < > 1.30* 1.06* 0.80  CALCIUM 8.8*   < > 8.9 7.7* 7.6*  GFRNONAA >60   < > 56* >60 >60  PROT 7.0  --   --   --   --   ALBUMIN 3.0*  --   --   --   --   AST 35  --   --   --   --   ALT 21  --   --   --   --   ALKPHOS 50  --   --   --   --   BILITOT 0.9  --   --   --   --    < > = values in this interval not displayed.    RADIOGRAPHIC STUDIES: I have personally reviewed the radiological images as listed and agreed with the findings in the report. DG Chest 1 View  Result Date: 04/23/2020 CLINICAL DATA:  Status post right thoracentesis. EXAM: CHEST  1 VIEW COMPARISON:  None. FINDINGS: The heart size and mediastinal contours are within normal limits. Left lung is clear. No pneumothorax is noted. Small pleural effusion is noted along the lateral wall of the right  lung base. The visualized skeletal structures are unremarkable. IMPRESSION: No pneumothorax seen status post right-sided thoracentesis. Small right pleural effusion is noted. Electronically Signed   By: Marijo Conception M.D.   On: 04/23/2020 12:29   CT ABDOMEN PELVIS W CONTRAST  Result Date: 04/21/2020 CLINICAL DATA:  Ascites 30 y/o presented thinking she was pregnant, no pregnancy seen on [place], massive ascites with abnormal appearance of uterus on bedside US EXAM: CT ABDOMEN AND PELVIS WITH CONTRAST TECHNIQUE: Multidetector CT imaging of the abdomen and pelvis was performed using the standard protocol following bolus administration of intravenous contrast. CONTRAST:  137mL OMNIPAQUE IOHEXOL 300 MG/ML  SOLN COMPARISON:  None. FINDINGS: Lower chest: Large right pleural effusion. Compressive atelectasis of the included right lung. There is leftward mediastinal shift. Hepatobiliary: Low-density adjacent to the falciform ligament is typical of focal fatty infiltration. There is no discrete focal liver lesion. The gallbladder is physiologically distended. There is no calcified gallstone. No intrahepatic biliary ductal dilatation. Common bile duct is not well seen. Pancreas: No ductal dilatation or inflammation. There is no evidence of pancreatic mass. Spleen: Normal in size. Cleft posteriorly. No focal splenic lesion. Adrenals/Urinary Tract: No adrenal nodule. No hydronephrosis. There is no perinephric edema. The urinary bladder appears completely decompressed. Stomach/Bowel: Bowel evaluation is limited in the absence of enteric contrast and presence of massive ascites. Ascites causes central displacement of small-bowel loops. No small bowel distension. Normal appendix tentatively visualized, series 3, image 65. Majority of the colon is decompressed. There is no obvious colonic wall thickening. Vascular/Lymphatic: Normal caliber abdominal aorta. Patent portal vein. There is prominence  of the left ovarian and gonadal  vasculature. No evidence of adenopathy, allowing for limitations related to ascites. Reproductive: Heterogeneous enhancing large right adnexal mass measures 14.7 x 12.8 x 13.6 cm. Lobulated enhancing components peripherally with central low-density. No normal right ovary is seen. There is a clear fat plane between this lesion and the uterus. Normal left ovary is tentatively visualized superior to the uterus just to the left of midline. Cystic structure in the region of the cervix is likely a nabothian cyst. Other: Massive abdominopelvic ascites. Small amount ascites leaks into the umbilicus. There is generalized subcutaneous edema. No free air. There is no obvious omental thickening, however degree of ascites partially obscures. Musculoskeletal: There are no acute or suspicious osseous abnormalities. IMPRESSION: 1. 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 Ovarian lesion is typically a fibroma in this setting. There is a cleavage plane between the adnexal mass and the uterus, which makes a pedunculated fibroid unlikely. 2. There is a probable nabothian cyst in the cervix. 3. Ascites causes mass effect on the abdominopelvic structures. Electronically Signed   By: Keith Rake M.D.   On: 04/21/2020 21:59   IR Paracentesis  Result Date: 04/22/2020 INDICATION: Patient admitted for right adnexal/ovarian mass with massive abdominopelvic ascites concerning for Meig's syndrome. Interventional radiology asked to perform a therapeutic and diagnostic paracentesis. EXAM: ULTRASOUND GUIDED PARACENTESIS MEDICATIONS: 1% lidocaine 15 mL COMPLICATIONS: None immediate. PROCEDURE: Informed written consent was obtained from the patient after a discussion of the risks, benefits and alternatives to treatment. A timeout was performed prior to the initiation of the procedure. Initial ultrasound scanning demonstrates a large  amount of ascites within the left lower abdominal quadrant. The left lower abdomen was prepped and draped in the usual sterile fashion. 1% lidocaine was used for local anesthesia. Following this, a 19 gauge, 7-cm, Yueh catheter was introduced. An ultrasound image was saved for documentation purposes. The paracentesis was performed. The catheter was removed and a dressing was applied. The patient tolerated the procedure well without immediate post procedural complication. FINDINGS: A total of approximately 9.1 L of clear yellow fluid was removed. Samples were sent to the laboratory as requested by the clinical team. IMPRESSION: Successful ultrasound-guided paracentesis yielding 9.1 liters of peritoneal fluid. Read by: Soyla Dryer, NP Electronically Signed   By: Sandi Mariscal M.D.   On: 04/22/2020 13:10   US THORACENTESIS ASP PLEURAL SPACE W/IMG GUIDE  Result Date: 04/23/2020 INDICATION: Shortness of breath. Large right pleural effusion. Request diagnostic and therapeutic thoracentesis. EXAM: ULTRASOUND GUIDED RIGHT THORACENTESIS MEDICATIONS: 1% plain lidocaine, 5 mL. COMPLICATIONS: None immediate. Postprocedural chest x-ray negative for pneumothorax. PROCEDURE: An ultrasound guided thoracentesis was thoroughly discussed with the patient and questions answered. The benefits, risks, alternatives and complications were also discussed. The patient understands and wishes to proceed with the procedure. Written consent was obtained. Ultrasound was performed to localize and mark an adequate pocket of fluid in the right chest. The area was then prepped and draped in the normal sterile fashion. 1% Lidocaine was used for local anesthesia. Under ultrasound guidance a 6 Fr Safe-T-Centesis catheter was introduced. Thoracentesis was performed. The catheter was removed and a dressing applied. FINDINGS: A total of approximately 1.9 L of slightly hazy yellow fluid was removed. Samples were sent to the laboratory as requested by  the clinical team. IMPRESSION: Successful ultrasound guided right thoracentesis yielding 1.9 L of pleural fluid. Read by: Ascencion Dike PA-C Electronically  Signed   By: Markus Daft M.D.   On: 04/23/2020 13:13

## 2020-05-18 NOTE — Assessment & Plan Note (Signed)
She has multifactorial anemia, could be an element of anemia chronic disease, postop blood loss and others I will check iron studies and B12 level in her next visit

## 2020-05-18 NOTE — Assessment & Plan Note (Signed)
We discussed the importance of preventive care and reviewed the vaccination programs. She does not have any prior allergic reactions to influenza vaccination. She agrees to proceed with influenza vaccination today and we will administer it today at the clinic.  

## 2020-05-18 NOTE — Telephone Encounter (Signed)
Scheduled appts per 2/9 sch msg. Gave pt a print out of AVS.  

## 2020-05-18 NOTE — Assessment & Plan Note (Signed)
We will get her case review at the next GYN oncology tumor board Given her young age, she would benefit from genetic counseling and genetic testing We discussed the role of treatment.  We reviewed the NCCN guidelines We discussed the role of chemotherapy. The intent is of curative intent.  We discussed some of the risks, benefits, side-effects of carboplatin & Taxol. Treatment is intravenous, every 3 weeks x 6 cycles  Some of the short term side-effects included, though not limited to, including weight loss, life threatening infections, risk of allergic reactions, need for transfusions of blood products, nausea, vomiting, change in bowel habits, loss of hair, admission to hospital for various reasons, and risks of death.   Long term side-effects are also discussed including risks of infertility, permanent damage to nerve function, hearing loss, chronic fatigue, kidney damage with possibility needing hemodialysis, and rare secondary malignancy including bone marrow disorders.  The patient is aware that the response rates discussed earlier is not guaranteed.  After a long discussion, patient made an informed decision to proceed with the prescribed plan of care.   Patient education material was dispensed. We discussed premedication with dexamethasone before chemotherapy. She is warned about risk of pregnancy during treatment The patient stated she has not have sexual intercourse for some time We will order pregnancy testing before each dose I will schedule chemo education class for tomorrow and we will start treatment in 2 weeks She has reasonable venous access and would not need port placement

## 2020-05-18 NOTE — Progress Notes (Signed)
Met with Brandy Knight and provided her with Recruitment consultant.  Encouraged her to call with any questions or needs.

## 2020-05-19 ENCOUNTER — Inpatient Hospital Stay: Payer: Medicaid Other

## 2020-05-19 ENCOUNTER — Other Ambulatory Visit: Payer: Self-pay

## 2020-05-23 ENCOUNTER — Encounter: Payer: Self-pay | Admitting: Oncology

## 2020-05-23 ENCOUNTER — Other Ambulatory Visit: Payer: Self-pay | Admitting: Oncology

## 2020-05-23 NOTE — Progress Notes (Signed)
Gynecologic Oncology Multi-Disciplinary Disposition Conference Note  Date of the Conference: 05/23/2020  Patient Name: Brandy Knight  Referring Provider: Dr. Vivien Rota Primary GYN Oncologist: Dr. Berline Lopes  Stage/Disposition:  Stage IC1 granulosa cell tumor. Disposition is to adjuvant systemic therapy with carboplatin and paclitaxel with repeat imaging after treatment.  Also discussed consideration for hormonal therapy with tamoxifen with CT imaging before treatment.   This Multidisciplinary conference took place involving physicians from [place], [place], Radiation Oncology, Pathology, Radiology along with the Gynecologic Oncology Nurse Practitioner and RN.  Comprehensive assessment of the patient's malignancy, staging, need for surgery, chemotherapy, radiation therapy, and need for further testing were reviewed. Supportive measures, both inpatient and following discharge were also discussed. The recommended plan of care is documented. Greater than 35 minutes were spent correlating and coordinating this patient's care.

## 2020-05-23 NOTE — Progress Notes (Signed)
Pharmacist Chemotherapy Monitoring - Initial Assessment    Anticipated start date: 2/21/   Regimen:  . Are orders appropriate based on the patient's diagnosis, regimen, and cycle? Yes . Does the plan date match the patient's scheduled date? Yes . Is the sequencing of drugs appropriate? Yes . Are the premedications appropriate for the patient's regimen? Yes . Prior Authorization for treatment is: Pending o If applicable, is the correct biosimilar selected based on the patient's insurance? not applicable  Organ Function and Labs: Marland Kitchen Are dose adjustments needed based on the patient's renal function, hepatic function, or hematologic function? Yes . Are appropriate labs ordered prior to the start of patient's treatment? Yes . Other organ system assessment, if indicated: N/A . The following baseline labs, if indicated, have been ordered: N/A  Dose Assessment: . Are the drug doses appropriate? Yes . Are the following correct: o Drug concentrations Yes o IV fluid compatible with drug Yes o Administration routes Yes o Timing of therapy Yes . If applicable, does the patient have documented access for treatment and/or plans for port-a-cath placement? no . If applicable, have lifetime cumulative doses been properly documented and assessed? yes Lifetime Dose Tracking  No doses have been documented on this patient for the following tracked chemicals: Doxorubicin, Epirubicin, Idarubicin, Daunorubicin, Mitoxantrone, Bleomycin, Oxaliplatin, Carboplatin, Liposomal Doxorubicin  o   Toxicity Monitoring/Prevention: . The patient has the following take home antiemetics prescribed: Ondansetron and Prochlorperazine . The patient has the following take home medications prescribed: N/A . Medication allergies and previous infusion related reactions, if applicable, have been reviewed and addressed. No . The patient's current medication list has been assessed for drug-drug interactions with their chemotherapy  regimen. no significant drug-drug interactions were identified on review.  Order Review: . Are the treatment plan orders signed? Yes . Is the patient scheduled to see a provider prior to their treatment? Yes  I verify that I have reviewed each item in the above checklist and answered each question accordingly.  Larene Beach, [place], 05/23/2020  4:08 PM

## 2020-05-27 ENCOUNTER — Encounter: Payer: Self-pay | Admitting: Hematology and Oncology

## 2020-05-27 NOTE — Progress Notes (Signed)
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  I left a msg requesting she return my call if she's interested in applying for the grant.  ?

## 2020-05-30 ENCOUNTER — Encounter: Payer: Self-pay | Admitting: Hematology and Oncology

## 2020-05-30 ENCOUNTER — Inpatient Hospital Stay (HOSPITAL_BASED_OUTPATIENT_CLINIC_OR_DEPARTMENT_OTHER): Payer: Medicaid Other | Admitting: Hematology and Oncology

## 2020-05-30 ENCOUNTER — Other Ambulatory Visit: Payer: Self-pay

## 2020-05-30 ENCOUNTER — Other Ambulatory Visit: Payer: Self-pay | Admitting: Hematology and Oncology

## 2020-05-30 ENCOUNTER — Inpatient Hospital Stay: Payer: Medicaid Other

## 2020-05-30 VITALS — BP 119/69 | HR 61 | Temp 98.1°F | Resp 16

## 2020-05-30 DIAGNOSIS — R188 Other ascites: Secondary | ICD-10-CM

## 2020-05-30 DIAGNOSIS — R739 Hyperglycemia, unspecified: Secondary | ICD-10-CM | POA: Insufficient documentation

## 2020-05-30 DIAGNOSIS — J90 Pleural effusion, not elsewhere classified: Secondary | ICD-10-CM

## 2020-05-30 DIAGNOSIS — D279 Benign neoplasm of unspecified ovary: Secondary | ICD-10-CM

## 2020-05-30 DIAGNOSIS — D3911 Neoplasm of uncertain behavior of right ovary: Secondary | ICD-10-CM

## 2020-05-30 DIAGNOSIS — T50905A Adverse effect of unspecified drugs, medicaments and biological substances, initial encounter: Secondary | ICD-10-CM

## 2020-05-30 DIAGNOSIS — Z23 Encounter for immunization: Secondary | ICD-10-CM

## 2020-05-30 DIAGNOSIS — D539 Nutritional anemia, unspecified: Secondary | ICD-10-CM

## 2020-05-30 LAB — CBC WITH DIFFERENTIAL (CANCER CENTER ONLY)
Abs Immature Granulocytes: 0.06 10*3/uL (ref 0.00–0.07)
Basophils Absolute: 0.1 10*3/uL (ref 0.0–0.1)
Basophils Relative: 1 %
Eosinophils Absolute: 0 10*3/uL (ref 0.0–0.5)
Eosinophils Relative: 0 %
HCT: 38.9 % (ref 36.0–46.0)
Hemoglobin: 12.1 g/dL (ref 12.0–15.0)
Immature Granulocytes: 1 %
Lymphocytes Relative: 8 %
Lymphs Abs: 0.8 10*3/uL (ref 0.7–4.0)
MCH: 25.4 pg — ABNORMAL LOW (ref 26.0–34.0)
MCHC: 31.1 g/dL (ref 30.0–36.0)
MCV: 81.7 fL (ref 80.0–100.0)
Monocytes Absolute: 0.1 10*3/uL (ref 0.1–1.0)
Monocytes Relative: 1 %
Neutro Abs: 8.1 10*3/uL — ABNORMAL HIGH (ref 1.7–7.7)
Neutrophils Relative %: 89 %
Platelet Count: 207 10*3/uL (ref 150–400)
RBC: 4.76 MIL/uL (ref 3.87–5.11)
RDW: 15.9 % — ABNORMAL HIGH (ref 11.5–15.5)
WBC Count: 9.1 10*3/uL (ref 4.0–10.5)
nRBC: 0 % (ref 0.0–0.2)

## 2020-05-30 LAB — CMP (CANCER CENTER ONLY)
ALT: 7 U/L (ref 0–44)
AST: 9 U/L — ABNORMAL LOW (ref 15–41)
Albumin: 3.8 g/dL (ref 3.5–5.0)
Alkaline Phosphatase: 59 U/L (ref 38–126)
Anion gap: 7 (ref 5–15)
BUN: 8 mg/dL (ref 6–20)
CO2: 23 mmol/L (ref 22–32)
Calcium: 9.6 mg/dL (ref 8.9–10.3)
Chloride: 107 mmol/L (ref 98–111)
Creatinine: 0.82 mg/dL (ref 0.44–1.00)
GFR, Estimated: >60 mL/min (ref >60)
Glucose, Bld: 159 mg/dL — ABNORMAL HIGH (ref 70–99)
Potassium: 4.3 mmol/L (ref 3.5–5.1)
Sodium: 137 mmol/L (ref 135–145)
Total Bilirubin: 0.3 mg/dL (ref 0.3–1.2)
Total Protein: 8.1 g/dL (ref 6.5–8.1)

## 2020-05-30 LAB — IRON AND TIBC
Iron: 72 ug/dL (ref 41–142)
Saturation Ratios: 25 % (ref 21–57)
TIBC: 286 ug/dL (ref 236–444)
UIBC: 214 ug/dL (ref 120–384)

## 2020-05-30 LAB — VITAMIN B12: Vitamin B-12: 544 pg/mL (ref 180–914)

## 2020-05-30 LAB — PREGNANCY, URINE: Preg Test, Ur: NEGATIVE

## 2020-05-30 LAB — FERRITIN: Ferritin: 48 ng/mL (ref 11–307)

## 2020-05-30 MED ORDER — PALONOSETRON HCL INJECTION 0.25 MG/5ML
INTRAVENOUS | Status: AC
  Filled 2020-05-30: qty 5

## 2020-05-30 MED ORDER — DIPHENHYDRAMINE HCL 50 MG/ML IJ SOLN
25.0000 mg | Freq: Once | INTRAMUSCULAR | Status: AC
  Administered 2020-05-30: 25 mg via INTRAVENOUS

## 2020-05-30 MED ORDER — SODIUM CHLORIDE 0.9% FLUSH
10.0000 mL | INTRAVENOUS | Status: DC | PRN
  Filled 2020-05-30: qty 10

## 2020-05-30 MED ORDER — FAMOTIDINE IN NACL 20-0.9 MG/50ML-% IV SOLN
20.0000 mg | Freq: Once | INTRAVENOUS | Status: AC
  Administered 2020-05-30: 20 mg via INTRAVENOUS

## 2020-05-30 MED ORDER — DIPHENHYDRAMINE HCL 50 MG/ML IJ SOLN
INTRAMUSCULAR | Status: AC
  Filled 2020-05-30: qty 1

## 2020-05-30 MED ORDER — SODIUM CHLORIDE 0.9 % IV SOLN
10.0000 mg | Freq: Once | INTRAVENOUS | Status: AC
  Administered 2020-05-30: 10 mg via INTRAVENOUS
  Filled 2020-05-30: qty 10

## 2020-05-30 MED ORDER — PALONOSETRON HCL INJECTION 0.25 MG/5ML
0.2500 mg | Freq: Once | INTRAVENOUS | Status: AC
  Administered 2020-05-30: 0.25 mg via INTRAVENOUS

## 2020-05-30 MED ORDER — SODIUM CHLORIDE 0.9 % IV SOLN
175.0000 mg/m2 | Freq: Once | INTRAVENOUS | Status: AC
  Administered 2020-05-30: 282 mg via INTRAVENOUS
  Filled 2020-05-30: qty 47

## 2020-05-30 MED ORDER — FAMOTIDINE IN NACL 20-0.9 MG/50ML-% IV SOLN
INTRAVENOUS | Status: AC
  Filled 2020-05-30: qty 50

## 2020-05-30 MED ORDER — COLD PACK MISC ONCOLOGY
1.0000 | Freq: Once | Status: DC | PRN
  Filled 2020-05-30: qty 1

## 2020-05-30 MED ORDER — SODIUM CHLORIDE 0.9 % IV SOLN
Freq: Once | INTRAVENOUS | Status: AC
  Filled 2020-05-30: qty 250

## 2020-05-30 MED ORDER — SODIUM CHLORIDE 0.9 % IV SOLN
150.0000 mg | Freq: Once | INTRAVENOUS | Status: AC
  Administered 2020-05-30: 150 mg via INTRAVENOUS
  Filled 2020-05-30: qty 150

## 2020-05-30 MED ORDER — HEPARIN SOD (PORK) LOCK FLUSH 100 UNIT/ML IV SOLN
500.0000 [IU] | Freq: Once | INTRAVENOUS | Status: DC | PRN
  Filled 2020-05-30: qty 5

## 2020-05-30 MED ORDER — SODIUM CHLORIDE 0.9 % IV SOLN
705.6000 mg | Freq: Once | INTRAVENOUS | Status: AC
  Administered 2020-05-30: 710 mg via INTRAVENOUS
  Filled 2020-05-30: qty 71

## 2020-05-30 NOTE — Patient Instructions (Signed)
[facility] Discharge Instructions for Patients Receiving Chemotherapy  Today you received the following chemotherapy agents: Taxol and Carboplatin   To help prevent nausea and vomiting after your treatment, we encourage you to take your nausea medication as directed.   If you develop nausea and vomiting that is not controlled by your nausea medication, call the clinic.   BELOW ARE SYMPTOMS THAT SHOULD BE REPORTED IMMEDIATELY:  *FEVER GREATER THAN 100.5 F  *CHILLS WITH OR WITHOUT FEVER  NAUSEA AND VOMITING THAT IS NOT CONTROLLED WITH YOUR NAUSEA MEDICATION  *UNUSUAL SHORTNESS OF BREATH  *UNUSUAL BRUISING OR BLEEDING  TENDERNESS IN MOUTH AND THROAT WITH OR WITHOUT PRESENCE OF ULCERS  *URINARY PROBLEMS  *BOWEL PROBLEMS  UNUSUAL RASH Items with * indicate a potential emergency and should be followed up as soon as possible.  Feel free to call the clinic should you have any questions or concerns. The clinic phone number is [phone].  Please show the [place] at check-in to the Emergency Department and triage nurse.  Carboplatin injection What is this medicine? CARBOPLATIN (KAR boe pla tin) is a chemotherapy drug. It targets fast dividing cells, like cancer cells, and causes these cells to die. This medicine is used to treat ovarian cancer and many other cancers. This medicine may be used for other purposes; ask your health care provider or pharmacist if you have questions. COMMON BRAND NAME(S): Paraplatin What should I tell my health care provider before I take this medicine? They need to know if you have any of these conditions:  blood disorders  hearing problems  kidney disease  recent or ongoing radiation therapy  an unusual or allergic reaction to carboplatin, cisplatin, other chemotherapy, other medicines, foods, dyes, or preservatives  pregnant or trying to get pregnant  breast-feeding How should I use this medicine? This drug is  usually given as an infusion into a vein. It is administered in a hospital or clinic by a specially trained health care professional. Talk to your pediatrician regarding the use of this medicine in children. Special care may be needed. Overdosage: If you think you have taken too much of this medicine contact a poison control center or emergency room at once. NOTE: This medicine is only for you. Do not share this medicine with others. What if I miss a dose? It is important not to miss a dose. Call your doctor or health care professional if you are unable to keep an appointment. What may interact with this medicine?  medicines for seizures  medicines to increase blood counts like filgrastim, pegfilgrastim, sargramostim  some antibiotics like amikacin, gentamicin, neomycin, streptomycin, tobramycin  vaccines Talk to your doctor or health care professional before taking any of these medicines:  acetaminophen  aspirin  ibuprofen  ketoprofen  naproxen This list may not describe all possible interactions. Give your health care provider a list of all the medicines, herbs, non-prescription drugs, or dietary supplements you use. Also tell them if you smoke, drink alcohol, or use illegal drugs. Some items may interact with your medicine. What should I watch for while using this medicine? Your condition will be monitored carefully while you are receiving this medicine. You will need important blood work done while you are taking this medicine. This drug may make you feel generally unwell. This is not uncommon, as chemotherapy can affect healthy cells as well as cancer cells. Report any side effects. Continue your course of treatment even though you feel ill unless your doctor tells  you to stop. In some cases, you may be given additional medicines to help with side effects. Follow all directions for their use. Call your doctor or health care professional for advice if you get a fever, chills or  sore throat, or other symptoms of a cold or flu. Do not treat yourself. This drug decreases your body's ability to fight infections. Try to avoid being around people who are sick. This medicine may increase your risk to bruise or bleed. Call your doctor or health care professional if you notice any unusual bleeding. Be careful brushing and flossing your teeth or using a toothpick because you may get an infection or bleed more easily. If you have any dental work done, tell your dentist you are receiving this medicine. Avoid taking products that contain aspirin, acetaminophen, ibuprofen, naproxen, or ketoprofen unless instructed by your doctor. These medicines may hide a fever. Do not become pregnant while taking this medicine. Women should inform their doctor if they wish to become pregnant or think they might be pregnant. There is a potential for serious side effects to an unborn child. Talk to your health care professional or pharmacist for more information. Do not breast-feed an infant while taking this medicine. What side effects may I notice from receiving this medicine? Side effects that you should report to your doctor or health care professional as soon as possible:  allergic reactions like skin rash, itching or hives, swelling of the face, lips, or tongue  signs of infection - fever or chills, cough, sore throat, pain or difficulty passing urine  signs of decreased platelets or bleeding - bruising, pinpoint red spots on the skin, black, tarry stools, nosebleeds  signs of decreased red blood cells - unusually weak or tired, fainting spells, lightheadedness  breathing problems  changes in hearing  changes in vision  chest pain  high blood pressure  low blood counts - This drug may decrease the number of white blood cells, red blood cells and platelets. You may be at increased risk for infections and bleeding.  nausea and vomiting  pain, swelling, redness or irritation at the  injection site  pain, tingling, numbness in the hands or feet  problems with balance, talking, walking  trouble passing urine or change in the amount of urine Side effects that usually do not require medical attention (report to your doctor or health care professional if they continue or are bothersome):  hair loss  loss of appetite  metallic taste in the mouth or changes in taste This list may not describe all possible side effects. Call your doctor for medical advice about side effects. You may report side effects to FDA at 1-800-FDA-1088. Where should I keep my medicine? This drug is given in a hospital or clinic and will not be stored at home. NOTE: This sheet is a summary. It may not cover all possible information. If you have questions about this medicine, talk to your doctor, pharmacist, or health care provider.  2021 Elsevier/Gold Standard (2007-07-01 14:38:05) Paclitaxel injection What is this medicine? PACLITAXEL (PAK li TAX el) is a chemotherapy drug. It targets fast dividing cells, like cancer cells, and causes these cells to die. This medicine is used to treat ovarian cancer, breast cancer, lung cancer, Kaposi's sarcoma, and other cancers. This medicine may be used for other purposes; ask your health care provider or pharmacist if you have questions. COMMON BRAND NAME(S): Onxol, Taxol What should I tell my health care provider before I take this medicine? They  need to know if you have any of these conditions:  history of irregular heartbeat  liver disease  low blood counts, like low white cell, platelet, or red cell counts  lung or breathing disease, like asthma  tingling of the fingers or toes, or other nerve disorder  an unusual or allergic reaction to paclitaxel, alcohol, polyoxyethylated castor oil, other chemotherapy, other medicines, foods, dyes, or preservatives  pregnant or trying to get pregnant  breast-feeding How should I use this medicine? This  drug is given as an infusion into a vein. It is administered in a hospital or clinic by a specially trained health care professional. Talk to your pediatrician regarding the use of this medicine in children. Special care may be needed. Overdosage: If you think you have taken too much of this medicine contact a poison control center or emergency room at once. NOTE: This medicine is only for you. Do not share this medicine with others. What if I miss a dose? It is important not to miss your dose. Call your doctor or health care professional if you are unable to keep an appointment. What may interact with this medicine? Do not take this medicine with any of the following medications:  live virus vaccines This medicine may also interact with the following medications:  antiviral medicines for hepatitis, HIV or AIDS  certain antibiotics like erythromycin and clarithromycin  certain medicines for fungal infections like ketoconazole and itraconazole  certain medicines for seizures like carbamazepine, phenobarbital, phenytoin  gemfibrozil  nefazodone  rifampin  St. John's wort This list may not describe all possible interactions. Give your health care provider a list of all the medicines, herbs, non-prescription drugs, or dietary supplements you use. Also tell them if you smoke, drink alcohol, or use illegal drugs. Some items may interact with your medicine. What should I watch for while using this medicine? Your condition will be monitored carefully while you are receiving this medicine. You will need important blood work done while you are taking this medicine. This medicine can cause serious allergic reactions. To reduce your risk you will need to take other medicine(s) before treatment with this medicine. If you experience allergic reactions like skin rash, itching or hives, swelling of the face, lips, or tongue, tell your doctor or health care professional right away. In some cases, you  may be given additional medicines to help with side effects. Follow all directions for their use. This drug may make you feel generally unwell. This is not uncommon, as chemotherapy can affect healthy cells as well as cancer cells. Report any side effects. Continue your course of treatment even though you feel ill unless your doctor tells you to stop. Call your doctor or health care professional for advice if you get a fever, chills or sore throat, or other symptoms of a cold or flu. Do not treat yourself. This drug decreases your body's ability to fight infections. Try to avoid being around people who are sick. This medicine may increase your risk to bruise or bleed. Call your doctor or health care professional if you notice any unusual bleeding. Be careful brushing and flossing your teeth or using a toothpick because you may get an infection or bleed more easily. If you have any dental work done, tell your dentist you are receiving this medicine. Avoid taking products that contain aspirin, acetaminophen, ibuprofen, naproxen, or ketoprofen unless instructed by your doctor. These medicines may hide a fever. Do not become pregnant while taking this medicine. Women should  inform their doctor if they wish to become pregnant or think they might be pregnant. There is a potential for serious side effects to an unborn child. Talk to your health care professional or pharmacist for more information. Do not breast-feed an infant while taking this medicine. Men are advised not to father a child while receiving this medicine. This product may contain alcohol. Ask your pharmacist or healthcare provider if this medicine contains alcohol. Be sure to tell all healthcare providers you are taking this medicine. Certain medicines, like metronidazole and disulfiram, can cause an unpleasant reaction when taken with alcohol. The reaction includes flushing, headache, nausea, vomiting, sweating, and increased thirst. The reaction  can last from 30 minutes to several hours. What side effects may I notice from receiving this medicine? Side effects that you should report to your doctor or health care professional as soon as possible:  allergic reactions like skin rash, itching or hives, swelling of the face, lips, or tongue  breathing problems  changes in vision  fast, irregular heartbeat  high or low blood pressure  mouth sores  pain, tingling, numbness in the hands or feet  signs of decreased platelets or bleeding - bruising, pinpoint red spots on the skin, black, tarry stools, blood in the urine  signs of decreased red blood cells - unusually weak or tired, feeling faint or lightheaded, falls  signs of infection - fever or chills, cough, sore throat, pain or difficulty passing urine  signs and symptoms of liver injury like dark yellow or brown urine; general ill feeling or flu-like symptoms; light-colored stools; loss of appetite; nausea; right upper belly pain; unusually weak or tired; yellowing of the eyes or skin  swelling of the ankles, feet, hands  unusually slow heartbeat Side effects that usually do not require medical attention (report to your doctor or health care professional if they continue or are bothersome):  diarrhea  hair loss  loss of appetite  muscle or joint pain  nausea, vomiting  pain, redness, or irritation at site where injected  tiredness This list may not describe all possible side effects. Call your doctor for medical advice about side effects. You may report side effects to FDA at 1-800-FDA-1088. Where should I keep my medicine? This drug is given in a hospital or clinic and will not be stored at home. NOTE: This sheet is a summary. It may not cover all possible information. If you have questions about this medicine, talk to your doctor, pharmacist, or health care provider.  2021 Elsevier/Gold Standard (2019-02-25 13:37:23)

## 2020-05-30 NOTE — Assessment & Plan Note (Signed)
Her wound is well-healed Her blood counts are back to normal We discussed the risk and benefits of treatment and she is in agreement to proceed I will see her again in 3 weeks for further follow-up

## 2020-05-30 NOTE — Progress Notes (Signed)
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   All questions were answered. The patient knows to call the clinic with any problems, questions or concerns. The total time spent in the appointment was 20 minutes encounter with patients including review of chart and various tests results, discussions about plan of care and coordination of care plan   Heath Lark, MD 05/30/2020 2:50 PM  INTERVAL HISTORY: Please see below for problem oriented charting. She returns for further follow-up She is doing well She is gaining back some weight Her surgical incisions are well-healed Denies abdominal distention or shortness of breath  SUMMARY OF ONCOLOGIC HISTORY: Oncology History Overview Note  1/14: inhibin B - 3,543 Inhibin A - 49  Presented initially on 04/21/20 with suspected pregnancy, increasing abdominal growth. 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   Granulosa cell tumor of ovary, right  04/21/2020 Imaging   CT A/P: 1. 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 Ovarian lesion is typically a fibroma in this  setting. There is a cleavage plane between the adnexal mass and the uterus, which makes a pedunculated fibroid unlikely. 2. There is a probable nabothian cyst in the cervix. 3. Ascites causes mass effect on the abdominopelvic structures   04/22/2020 Procedure   Paracentesis, 9L  FINAL MICROSCOPIC DIAGNOSIS:  - Atypical cells present  - See comment.    04/23/2020 Procedure   Thoracentesis, 2L  FINAL MICROSCOPIC DIAGNOSIS:  - Reactive mesothelial cells present   04/27/2020 Surgery   Robotic-assisted lysis of adhesions, left salpingoo-oophorectomy, laparotomy for specimen removal, ileocecal resection and reanastomosis, appendectomy, omentectomy  Findings: On EUA, small mobile uterus with mass that moves in junction with the uterus, sitting out of the pelvis and palpable with the abdominal hand. Mass is firm and adherent to the anterior abdominal wall. On intra-abdominal entry, approximately 6L of green-tinged ascites noted and removed. Normal appearing upper abdominal survey. Normal appearing omentum. 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 zsome pockets of degeneration that are unavoidably entered during gentle manipulation of the ovary. Mass with dense adhesions to the anterior abdominal wall on the right, the right sidewall, and several loops of ileum. Uterus 8cm and normal appearing. Right fallopian tube and ovary without evidence of disease. Some inflammatory rind noted on the small and large bowel. Given size of mass and its suspected degeneration and poor integrity, unable to place in an Endocatch bag and midline excision extended for specimen removal. Given concern for thermal damage to the terminal ileum from lyssi of dense adhesions, decision made to proceed with ileocectomy. 1-1.5cm mesenteric node palpated within the mesentery removed. Some shotty sub centimeter mesenteric nodes palpated.  No para-aortic or pelvic lymphadenopathy. Liver edge,  diaphragm and stomach smooth. Omentum without evidence of disease. An additional 3L of ascites was removed over the course of the surgery.   04/27/2020 Pathology Results   A. OVARY AND FALLOPIAN TUBE, LEFT, SALPINGO OOPHORECTOMY:  - Granulosa cell tumor, adult type, multiple fragments measuring 18.5 cm  in aggregate  - No evidence of ovarian surface involvement  - Benign unremarkable fallopian tube  - See oncology table   B. ILEOCECUM AND APPENDIX, RESECTION:  - Segment of terminal ileum (9 cm) and colon (10 cm) showing focal  serosal disruption  - Unremarkable appendix  - Margins appear viable  - Benign lymph nodes  - No evidence of malignancy   C. OMENTUM, RESECTION:  - Portion of omentum with congestion and mild mesothelial hyperplasia  - No evidence of malignancy   OVARY or FALLOPIAN TUBE or PRIMARY PERITONEUM: Resection   Procedure: Salpingo-oophorectomy  Specimen Integrity: Fragmented  Tumor Site: Left ovary  Tumor Size: Multiple tissue fragments measured 18.5 cm in aggregate  Histologic Type: Granulosa cell tumor, adult type  Histologic Grade: Not applicable  Ovarian Surface Involvement: Not identified  Fallopian Tube Surface Involvement: Not identified  Implants (required for advanced stage serous/seromucinous borderline  tumors only): Not applicable  Other Tissue/ Organ Involvement: Not applicable  Largest Extrapelvic Peritoneal Focus: Not identified  Peritoneal/Ascitic Fluid Involvement: Not identified ([id number])  Chemotherapy Response Score (CRS): Not applicable, no known presurgical  therapy  Regional Lymph Nodes: Not applicable (no lymph nodes submitted or found)  Distant Metastasis:       Distant Site(s) Involved: Not applicable  Pathologic Stage Classification (pTNM, AJCC 8th Edition): pT1c, pN not  assigned (no nodes submitted or found).  See comment  Ancillary Studies: Can be performed upon request  Representative Tumor Block: A2  Comment(s): The tumor  stage is due to intraoperative surgical spill of  the tumor cells based on discussion with the surgeon Dr. Jeral Pinch.   04/27/2020 Cancer Staging   Staging form: Ovary, Fallopian Tube, and Primary Peritoneal Carcinoma, AJCC 8th Edition - Clinical stage from 04/27/2020: FIGO Stage IC1, calculated as Stage IC (cT1c1, cN0, cM0) - Signed by Lafonda Mosses, MD on 05/05/2020   05/05/2020 Initial Diagnosis   Granulosa cell tumor of ovary, right   05/30/2020 -  Chemotherapy    Patient is on Treatment Plan: OVARIAN CARBOPLATIN (AUC 6) / PACLITAXEL (175) Q21D X 6 CYCLES        REVIEW OF SYSTEMS:   Constitutional: Denies fevers, chills or abnormal weight loss Eyes: Denies blurriness of vision Ears, nose, mouth, throat, and face: Denies mucositis or sore throat Respiratory: Denies cough, dyspnea or wheezes Cardiovascular: Denies palpitation, chest discomfort or lower extremity swelling Gastrointestinal:  Denies nausea, heartburn or change in bowel habits Skin: Denies abnormal skin rashes Lymphatics: Denies new lymphadenopathy or easy bruising Neurological:Denies numbness, tingling or new weaknesses Behavioral/Psych: Mood is stable, no new changes  All other systems were reviewed with the patient and are negative.  I have reviewed the past medical history, past surgical history, social history and family history with the patient and they are unchanged from previous note.  ALLERGIES:  has No Known Allergies.  MEDICATIONS:  Current Outpatient Medications  Medication Sig Dispense Refill  . dexamethasone (DECADRON) 4 MG tablet Take 2 tabs at the night before and 2 tab the morning of chemotherapy, every 3 weeks, by mouth x 6 cycles 36 tablet 6  . ondansetron (ZOFRAN) 8 MG tablet  Take 1 tablet (8 mg total) by mouth every 8 (eight) hours as needed for refractory nausea / vomiting. Start on day 3 after carboplatin chemo. 30 tablet 1  . prochlorperazine (COMPAZINE) 10 MG tablet Take 1  tablet (10 mg total) by mouth every 6 (six) hours as needed (Nausea or vomiting). 30 tablet 1   No current facility-administered medications for this visit.   Facility-Administered Medications Ordered in Other Visits  Medication Dose Route Frequency Provider Last Rate Last Admin  . CARBOplatin (PARAPLATIN) 710 mg in sodium chloride 0.9 % 250 mL chemo infusion  710 mg Intravenous Once Alvy Bimler, Patria Warzecha, MD      . Cold Pack 1 packet  1 packet Topical Once PRN Alvy Bimler, Holliday Sheaffer, MD      . heparin lock flush 100 unit/mL  500 Units Intracatheter Once PRN Alvy Bimler, Voshon Petro, MD      . sodium chloride flush (NS) 0.9 % injection 10 mL  10 mL Intracatheter PRN Alvy Bimler, Takeo Harts, MD        PHYSICAL EXAMINATION: ECOG PERFORMANCE STATUS: 1 - Symptomatic but completely ambulatory  Vitals:   05/30/20 0822  BP: 136/72  Pulse: 84  Resp: 18  Temp: (!) 97.4 F (36.3 C)  SpO2: 100%   Filed Weights   05/30/20 1610  Weight: 130 lb (59 kg)    GENERAL:alert, no distress and comfortable SKIN: skin color, texture, turgor are normal, no rashes or significant lesions EYES: normal, Conjunctiva are pink and non-injected, sclera clear OROPHARYNX:no exudate, no erythema and lips, buccal mucosa, and tongue normal  NECK: supple, thyroid normal size, non-tender, without nodularity LYMPH:  no palpable lymphadenopathy in the cervical, axillary or inguinal LUNGS: clear to auscultation and percussion with normal breathing effort HEART: regular rate & rhythm and no murmurs and no lower extremity edema ABDOMEN:abdomen soft, non-tender and normal bowel sounds. Noted well healed surgical scars Musculoskeletal:no cyanosis of digits and no clubbing  NEURO: alert & oriented x 3 with fluent speech, no focal motor/sensory deficits  LABORATORY DATA:  I have reviewed the data as listed    Component Value Date/Time   NA 137 05/30/2020 0752   K 4.3 05/30/2020 0752   CL 107 05/30/2020 0752   CO2 23 05/30/2020 0752   GLUCOSE 159 (H) 05/30/2020  0752   BUN 8 05/30/2020 0752   CREATININE 0.82 05/30/2020 0752   CALCIUM 9.6 05/30/2020 0752   PROT 8.1 05/30/2020 0752   ALBUMIN 3.8 05/30/2020 0752   AST 9 (L) 05/30/2020 0752   ALT 7 05/30/2020 0752   ALKPHOS 59 05/30/2020 0752   BILITOT 0.3 05/30/2020 0752   GFRNONAA >60 05/30/2020 0752    No results found for: SPEP, UPEP  Lab Results  Component Value Date   WBC 9.1 05/30/2020   NEUTROABS 8.1 (H) 05/30/2020   HGB 12.1 05/30/2020   HCT 38.9 05/30/2020   MCV 81.7 05/30/2020   PLT 207 05/30/2020      Chemistry      Component Value Date/Time   NA 137 05/30/2020 0752   K 4.3 05/30/2020 0752   CL 107 05/30/2020 0752   CO2 23 05/30/2020 0752   BUN 8 05/30/2020 0752   CREATININE 0.82 05/30/2020 0752      Component Value Date/Time   CALCIUM 9.6 05/30/2020 0752   ALKPHOS 59 05/30/2020 0752   AST 9 (L) 05/30/2020 0752   ALT 7 05/30/2020 0752   BILITOT 0.3 05/30/2020 0752

## 2020-05-30 NOTE — Assessment & Plan Note (Signed)
Pleural effusion is resolved She has no detectable ascites on exam

## 2020-05-30 NOTE — Assessment & Plan Note (Signed)
Her blood sugar is mildly elevated likely due to steroids Observe closely for

## 2020-05-31 LAB — INHIBIN B: Inhibin B: 82.3 pg/mL

## 2020-06-20 ENCOUNTER — Other Ambulatory Visit: Payer: Self-pay

## 2020-06-20 ENCOUNTER — Encounter: Payer: Self-pay | Admitting: Hematology and Oncology

## 2020-06-20 ENCOUNTER — Inpatient Hospital Stay: Payer: Medicaid Other | Attending: Gynecologic Oncology

## 2020-06-20 ENCOUNTER — Inpatient Hospital Stay: Payer: Medicaid Other

## 2020-06-20 ENCOUNTER — Inpatient Hospital Stay (HOSPITAL_BASED_OUTPATIENT_CLINIC_OR_DEPARTMENT_OTHER): Payer: Medicaid Other | Admitting: Hematology and Oncology

## 2020-06-20 DIAGNOSIS — Z7952 Long term (current) use of systemic steroids: Secondary | ICD-10-CM | POA: Diagnosis not present

## 2020-06-20 DIAGNOSIS — Z5111 Encounter for antineoplastic chemotherapy: Secondary | ICD-10-CM | POA: Diagnosis not present

## 2020-06-20 DIAGNOSIS — D3911 Neoplasm of uncertain behavior of right ovary: Secondary | ICD-10-CM

## 2020-06-20 DIAGNOSIS — R739 Hyperglycemia, unspecified: Secondary | ICD-10-CM

## 2020-06-20 DIAGNOSIS — Z79899 Other long term (current) drug therapy: Secondary | ICD-10-CM | POA: Diagnosis not present

## 2020-06-20 DIAGNOSIS — D539 Nutritional anemia, unspecified: Secondary | ICD-10-CM

## 2020-06-20 DIAGNOSIS — K5909 Other constipation: Secondary | ICD-10-CM | POA: Insufficient documentation

## 2020-06-20 DIAGNOSIS — T50905A Adverse effect of unspecified drugs, medicaments and biological substances, initial encounter: Secondary | ICD-10-CM

## 2020-06-20 DIAGNOSIS — Z23 Encounter for immunization: Secondary | ICD-10-CM

## 2020-06-20 DIAGNOSIS — T451X5A Adverse effect of antineoplastic and immunosuppressive drugs, initial encounter: Secondary | ICD-10-CM | POA: Insufficient documentation

## 2020-06-20 DIAGNOSIS — C561 Malignant neoplasm of right ovary: Secondary | ICD-10-CM | POA: Insufficient documentation

## 2020-06-20 DIAGNOSIS — D6481 Anemia due to antineoplastic chemotherapy: Secondary | ICD-10-CM | POA: Insufficient documentation

## 2020-06-20 LAB — CBC WITH DIFFERENTIAL (CANCER CENTER ONLY)
Abs Immature Granulocytes: 0.02 10*3/uL (ref 0.00–0.07)
Basophils Absolute: 0.1 10*3/uL (ref 0.0–0.1)
Basophils Relative: 1 %
Eosinophils Absolute: 0 10*3/uL (ref 0.0–0.5)
Eosinophils Relative: 0 %
HCT: 35.4 % — ABNORMAL LOW (ref 36.0–46.0)
Hemoglobin: 11 g/dL — ABNORMAL LOW (ref 12.0–15.0)
Immature Granulocytes: 0 %
Lymphocytes Relative: 8 %
Lymphs Abs: 0.8 10*3/uL (ref 0.7–4.0)
MCH: 25.8 pg — ABNORMAL LOW (ref 26.0–34.0)
MCHC: 31.1 g/dL (ref 30.0–36.0)
MCV: 82.9 fL (ref 80.0–100.0)
Monocytes Absolute: 0.1 10*3/uL (ref 0.1–1.0)
Monocytes Relative: 1 %
Neutro Abs: 9.5 10*3/uL — ABNORMAL HIGH (ref 1.7–7.7)
Neutrophils Relative %: 90 %
Platelet Count: 252 10*3/uL (ref 150–400)
RBC: 4.27 MIL/uL (ref 3.87–5.11)
RDW: 15.2 % (ref 11.5–15.5)
WBC Count: 10.5 10*3/uL (ref 4.0–10.5)
nRBC: 0 % (ref 0.0–0.2)

## 2020-06-20 LAB — CMP (CANCER CENTER ONLY)
ALT: 10 U/L (ref 0–44)
AST: 10 U/L — ABNORMAL LOW (ref 15–41)
Albumin: 3.8 g/dL (ref 3.5–5.0)
Alkaline Phosphatase: 77 U/L (ref 38–126)
Anion gap: 9 (ref 5–15)
BUN: 5 mg/dL — ABNORMAL LOW (ref 6–20)
CO2: 24 mmol/L (ref 22–32)
Calcium: 9.5 mg/dL (ref 8.9–10.3)
Chloride: 107 mmol/L (ref 98–111)
Creatinine: 0.89 mg/dL (ref 0.44–1.00)
GFR, Estimated: >60 mL/min (ref >60)
Glucose, Bld: 189 mg/dL — ABNORMAL HIGH (ref 70–99)
Potassium: 3.7 mmol/L (ref 3.5–5.1)
Sodium: 140 mmol/L (ref 135–145)
Total Bilirubin: <0.2 mg/dL — ABNORMAL LOW (ref 0.3–1.2)
Total Protein: 8.2 g/dL — ABNORMAL HIGH (ref 6.5–8.1)

## 2020-06-20 LAB — PREGNANCY, URINE: Preg Test, Ur: NEGATIVE

## 2020-06-20 MED ORDER — FAMOTIDINE IN NACL 20-0.9 MG/50ML-% IV SOLN
INTRAVENOUS | Status: AC
  Filled 2020-06-20: qty 50

## 2020-06-20 MED ORDER — DIPHENHYDRAMINE HCL 50 MG/ML IJ SOLN
INTRAMUSCULAR | Status: AC
  Filled 2020-06-20: qty 1

## 2020-06-20 MED ORDER — SODIUM CHLORIDE 0.9 % IV SOLN
Freq: Once | INTRAVENOUS | Status: AC
  Filled 2020-06-20: qty 250

## 2020-06-20 MED ORDER — PALONOSETRON HCL INJECTION 0.25 MG/5ML
INTRAVENOUS | Status: AC
  Filled 2020-06-20: qty 5

## 2020-06-20 MED ORDER — FAMOTIDINE IN NACL 20-0.9 MG/50ML-% IV SOLN
20.0000 mg | Freq: Once | INTRAVENOUS | Status: AC
  Administered 2020-06-20: 20 mg via INTRAVENOUS

## 2020-06-20 MED ORDER — SODIUM CHLORIDE 0.9 % IV SOLN
175.0000 mg/m2 | Freq: Once | INTRAVENOUS | Status: AC
  Administered 2020-06-20: 282 mg via INTRAVENOUS
  Filled 2020-06-20: qty 47

## 2020-06-20 MED ORDER — CARBOPLATIN CHEMO INJECTION 600 MG/60ML
661.8000 mg | Freq: Once | INTRAVENOUS | Status: AC
Start: 2020-06-20 — End: 2020-06-20
  Administered 2020-06-20: 660 mg via INTRAVENOUS
  Filled 2020-06-20: qty 66

## 2020-06-20 MED ORDER — SODIUM CHLORIDE 0.9 % IV SOLN
10.0000 mg | Freq: Once | INTRAVENOUS | Status: AC
  Administered 2020-06-20: 10 mg via INTRAVENOUS
  Filled 2020-06-20: qty 10

## 2020-06-20 MED ORDER — DIPHENHYDRAMINE HCL 50 MG/ML IJ SOLN
25.0000 mg | Freq: Once | INTRAMUSCULAR | Status: AC
  Administered 2020-06-20: 25 mg via INTRAVENOUS

## 2020-06-20 MED ORDER — SODIUM CHLORIDE 0.9 % IV SOLN
150.0000 mg | Freq: Once | INTRAVENOUS | Status: AC
  Administered 2020-06-20: 150 mg via INTRAVENOUS
  Filled 2020-06-20: qty 150

## 2020-06-20 MED ORDER — PALONOSETRON HCL INJECTION 0.25 MG/5ML
0.2500 mg | Freq: Once | INTRAVENOUS | Status: AC
  Administered 2020-06-20: 0.25 mg via INTRAVENOUS

## 2020-06-20 NOTE — Assessment & Plan Note (Signed)
We discussed the importance of aggressive laxative therapy 

## 2020-06-20 NOTE — Assessment & Plan Note (Signed)
Overall, she tolerated treatment very well except for some mild bone aches and constipation which are not unexpected side effects of treatment We will proceed with treatment as scheduled

## 2020-06-20 NOTE — Assessment & Plan Note (Signed)

## 2020-06-20 NOTE — Progress Notes (Signed)
[place] OFFICE PROGRESS NOTE  Patient Care Team: Patient, No Pcp Per as PCP - General (General Practice) Awanda Mink, Craige Cotta, RN as Oncology Nurse Navigator (Oncology)  ASSESSMENT & PLAN:  Granulosa cell tumor of ovary, right Overall, she tolerated treatment very well except for some mild bone aches and constipation which are not unexpected side effects of treatment We will proceed with treatment as scheduled   Anemia due to antineoplastic chemotherapy This is likely due to recent treatment. The patient denies recent history of bleeding such as epistaxis, hematuria or hematochezia. She is asymptomatic from the anemia. I will observe for now.  She does not require transfusion now. I will continue the chemotherapy at current dose without dosage adjustment.  If the anemia gets progressive worse in the future, I might have to delay her treatment or adjust the chemotherapy dose.   Other constipation We discussed the importance of aggressive laxative therapy  Drug-induced hyperglycemia Her blood sugar is mildly elevated likely due to steroids Observe closely for now   No orders of the defined types were placed in this encounter.   All questions were answered. The patient knows to call the clinic with any problems, questions or concerns. The total time spent in the appointment was 20 minutes encounter with patients including review of chart and various tests results, discussions about plan of care and coordination of care plan   Heath Lark, MD 06/20/2020 11:46 AM  INTERVAL HISTORY: Please see below for problem oriented charting. She returns for further follow-up She is doing well She had some constipation and bone ache for 2 days after treatment but self-limiting She denies peripheral neuropathy She has gained some weight  SUMMARY OF ONCOLOGIC HISTORY: Oncology History Overview Note  1/14: inhibin B - 3,543 Inhibin A - 42  Presented initially on 04/21/20 with  suspected pregnancy, increasing abdominal growth. 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   Granulosa cell tumor of ovary, right  04/21/2020 Imaging   CT A/P: 1. 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 Ovarian lesion is typically a fibroma in this setting. There is a cleavage plane between the adnexal mass and the uterus, which makes a pedunculated fibroid unlikely. 2. There is a probable nabothian cyst in the cervix. 3. Ascites causes mass effect on the abdominopelvic structures   04/22/2020 Procedure   Paracentesis, 9L  FINAL MICROSCOPIC DIAGNOSIS:  - Atypical cells present  - See comment.    04/23/2020 Procedure   Thoracentesis, 2L  FINAL MICROSCOPIC DIAGNOSIS:  - Reactive mesothelial cells present   04/27/2020 Surgery   Robotic-assisted lysis of adhesions, left salpingoo-oophorectomy, laparotomy for specimen removal, ileocecal resection and reanastomosis, appendectomy, omentectomy  Findings: On EUA, small mobile uterus with mass that moves in junction with the uterus, sitting out of the pelvis and palpable with the abdominal hand. Mass is firm and adherent to the anterior abdominal wall. On intra-abdominal entry, approximately 6L of green-tinged ascites noted and removed. Normal appearing upper abdominal survey. Normal appearing omentum. 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 zsome pockets of degeneration that are unavoidably entered during gentle manipulation of the ovary. Mass with dense adhesions to the anterior abdominal wall on the right, the right sidewall, and several loops of ileum. Uterus 8cm and normal appearing. Right fallopian tube and ovary without evidence of disease. Some inflammatory rind  noted on the small and large bowel. Given size of mass and  its suspected degeneration and poor integrity, unable to place in an Endocatch bag and midline excision extended for specimen removal. Given concern for thermal damage to the terminal ileum from lyssi of dense adhesions, decision made to proceed with ileocectomy. 1-1.5cm mesenteric node palpated within the mesentery removed. Some shotty sub centimeter mesenteric nodes palpated. No para-aortic or pelvic lymphadenopathy. Liver edge, diaphragm and stomach smooth. Omentum without evidence of disease. An additional 3L of ascites was removed over the course of the surgery.   04/27/2020 Pathology Results   A. OVARY AND FALLOPIAN TUBE, LEFT, SALPINGO OOPHORECTOMY:  - Granulosa cell tumor, adult type, multiple fragments measuring 18.5 cm  in aggregate  - No evidence of ovarian surface involvement  - Benign unremarkable fallopian tube  - See oncology table   B. ILEOCECUM AND APPENDIX, RESECTION:  - Segment of terminal ileum (9 cm) and colon (10 cm) showing focal  serosal disruption  - Unremarkable appendix  - Margins appear viable  - Benign lymph nodes  - No evidence of malignancy   C. OMENTUM, RESECTION:  - Portion of omentum with congestion and mild mesothelial hyperplasia  - No evidence of malignancy   OVARY or FALLOPIAN TUBE or PRIMARY PERITONEUM: Resection   Procedure: Salpingo-oophorectomy  Specimen Integrity: Fragmented  Tumor Site: Left ovary  Tumor Size: Multiple tissue fragments measured 18.5 cm in aggregate  Histologic Type: Granulosa cell tumor, adult type  Histologic Grade: Not applicable  Ovarian Surface Involvement: Not identified  Fallopian Tube Surface Involvement: Not identified  Implants (required for advanced stage serous/seromucinous borderline  tumors only): Not applicable  Other Tissue/ Organ Involvement: Not applicable  Largest Extrapelvic Peritoneal Focus: Not identified  Peritoneal/Ascitic Fluid Involvement: Not identified ([id number])  Chemotherapy Response Score  (CRS): Not applicable, no known presurgical  therapy  Regional Lymph Nodes: Not applicable (no lymph nodes submitted or found)  Distant Metastasis:       Distant Site(s) Involved: Not applicable  Pathologic Stage Classification (pTNM, AJCC 8th Edition): pT1c, pN not  assigned (no nodes submitted or found).  See comment  Ancillary Studies: Can be performed upon request  Representative Tumor Block: A2  Comment(s): The tumor stage is due to intraoperative surgical spill of  the tumor cells based on discussion with the surgeon Dr. Jeral Pinch.   04/27/2020 Cancer Staging   Staging form: Ovary, Fallopian Tube, and Primary Peritoneal Carcinoma, AJCC 8th Edition - Clinical stage from 04/27/2020: FIGO Stage IC1, calculated as Stage IC (cT1c1, cN0, cM0) - Signed by Lafonda Mosses, MD on 05/05/2020   05/05/2020 Initial Diagnosis   Granulosa cell tumor of ovary, right   05/30/2020 -  Chemotherapy    Patient is on Treatment Plan: OVARIAN CARBOPLATIN (AUC 6) / PACLITAXEL (175) Q21D X 6 CYCLES        REVIEW OF SYSTEMS:   Constitutional: Denies fevers, chills or abnormal weight loss Eyes: Denies blurriness of vision Ears, nose, mouth, throat, and face: Denies mucositis or sore throat Respiratory: Denies cough, dyspnea or wheezes Cardiovascular: Denies palpitation, chest discomfort or lower extremity swelling Skin: Denies abnormal skin rashes Lymphatics: Denies new lymphadenopathy or easy bruising Neurological:Denies numbness, tingling or new weaknesses Behavioral/Psych: Mood is stable, no new changes  All other systems were reviewed with the patient and are negative.  I have reviewed the past medical history, past surgical history, social history and family history with the patient and they are unchanged from previous note.  ALLERGIES:  has No Known Allergies.  MEDICATIONS:  Current Outpatient Medications  Medication Sig Dispense Refill  . dexamethasone (DECADRON) 4 MG tablet  Take 2 tabs at the night before and 2 tab the morning of chemotherapy, every 3 weeks, by mouth x 6 cycles 36 tablet 6  . ondansetron (ZOFRAN) 8 MG tablet Take 1 tablet (8 mg total) by mouth every 8 (eight) hours as needed for refractory nausea / vomiting. Start on day 3 after carboplatin chemo. 30 tablet 1  . prochlorperazine (COMPAZINE) 10 MG tablet Take 1 tablet (10 mg total) by mouth every 6 (six) hours as needed (Nausea or vomiting). 30 tablet 1   No current facility-administered medications for this visit.   Facility-Administered Medications Ordered in Other Visits  Medication Dose Route Frequency Provider Last Rate Last Admin  . CARBOplatin (PARAPLATIN) 660 mg in sodium chloride 0.9 % 250 mL chemo infusion  660 mg Intravenous Once Alvy Bimler, Tavyn Kurka, MD      . PACLitaxel (TAXOL) 282 mg in sodium chloride 0.9 % 250 mL chemo infusion (> 80mg /m2)  175 mg/m2 (Treatment Plan Recorded) Intravenous Once Heath Lark, MD        PHYSICAL EXAMINATION: ECOG PERFORMANCE STATUS: 1 - Symptomatic but completely ambulatory  Vitals:   06/20/20 0854  BP: (!) 142/84  Pulse: 88  Resp: 18  Temp: 97.7 F (36.5 C)  SpO2: 100%   Filed Weights   06/20/20 0854  Weight: 138 lb 6.4 oz (62.8 kg)    GENERAL:alert, no distress and comfortable SKIN: skin color, texture, turgor are normal, no rashes or significant lesions EYES: normal, Conjunctiva are pink and non-injected, sclera clear OROPHARYNX:no exudate, no erythema and lips, buccal mucosa, and tongue normal  NECK: supple, thyroid normal size, non-tender, without nodularity LYMPH:  no palpable lymphadenopathy in the cervical, axillary or inguinal LUNGS: clear to auscultation and percussion with normal breathing effort HEART: regular rate & rhythm and no murmurs and no lower extremity edema ABDOMEN:abdomen soft, non-tender and normal bowel sounds Musculoskeletal:no cyanosis of digits and no clubbing  NEURO: alert & oriented x 3 with fluent speech, no focal  motor/sensory deficits  LABORATORY DATA:  I have reviewed the data as listed    Component Value Date/Time   NA 140 06/20/2020 0828   K 3.7 06/20/2020 0828   CL 107 06/20/2020 0828   CO2 24 06/20/2020 0828   GLUCOSE 189 (H) 06/20/2020 0828   BUN 5 (L) 06/20/2020 0828   CREATININE 0.89 06/20/2020 0828   CALCIUM 9.5 06/20/2020 0828   PROT 8.2 (H) 06/20/2020 0828   ALBUMIN 3.8 06/20/2020 0828   AST 10 (L) 06/20/2020 0828   ALT 10 06/20/2020 0828   ALKPHOS 77 06/20/2020 0828   BILITOT <0.2 (L) 06/20/2020 0828   GFRNONAA >60 06/20/2020 0828    No results found for: SPEP, UPEP  Lab Results  Component Value Date   WBC 10.5 06/20/2020   NEUTROABS 9.5 (H) 06/20/2020   HGB 11.0 (L) 06/20/2020   HCT 35.4 (L) 06/20/2020   MCV 82.9 06/20/2020   PLT 252 06/20/2020      Chemistry      Component Value Date/Time   NA 140 06/20/2020 0828   K 3.7 06/20/2020 0828   CL 107 06/20/2020 0828   CO2 24 06/20/2020 0828   BUN 5 (L) 06/20/2020 0828   CREATININE 0.89 06/20/2020 0828      Component Value Date/Time   CALCIUM 9.5 06/20/2020 0828   ALKPHOS 77 06/20/2020 0828  AST 10 (L) 06/20/2020 0828   ALT 10 06/20/2020 0828   BILITOT <0.2 (L) 06/20/2020 8938

## 2020-06-20 NOTE — Patient Instructions (Signed)
   [facility] Discharge Instructions for Patients Receiving Chemotherapy  Today you received the following chemotherapy agents Taxol and Carboplatin   To help prevent nausea and vomiting after your treatment, we encourage you to take your nausea medication as directed.    If you develop nausea and vomiting that is not controlled by your nausea medication, call the clinic.   BELOW ARE SYMPTOMS THAT SHOULD BE REPORTED IMMEDIATELY:  *FEVER GREATER THAN 100.5 F  *CHILLS WITH OR WITHOUT FEVER  NAUSEA AND VOMITING THAT IS NOT CONTROLLED WITH YOUR NAUSEA MEDICATION  *UNUSUAL SHORTNESS OF BREATH  *UNUSUAL BRUISING OR BLEEDING  TENDERNESS IN MOUTH AND THROAT WITH OR WITHOUT PRESENCE OF ULCERS  *URINARY PROBLEMS  *BOWEL PROBLEMS  UNUSUAL RASH Items with * indicate a potential emergency and should be followed up as soon as possible.  Feel free to call the clinic should you have any questions or concerns. The clinic phone number is [phone].  Please show the CHEMO ALERT CARD at check-in to the Emergency Department and triage nurse.   

## 2020-06-20 NOTE — Assessment & Plan Note (Signed)
Her blood sugar is mildly elevated likely due to steroids Observe closely for now

## 2020-06-21 LAB — INHIBIN B: Inhibin B: 17.5 pg/mL

## 2020-07-11 ENCOUNTER — Encounter: Payer: Self-pay | Admitting: Hematology and Oncology

## 2020-07-11 ENCOUNTER — Inpatient Hospital Stay (HOSPITAL_BASED_OUTPATIENT_CLINIC_OR_DEPARTMENT_OTHER): Payer: Medicaid Other | Admitting: Hematology and Oncology

## 2020-07-11 ENCOUNTER — Other Ambulatory Visit: Payer: Self-pay

## 2020-07-11 ENCOUNTER — Inpatient Hospital Stay: Payer: Medicaid Other | Attending: Gynecologic Oncology

## 2020-07-11 ENCOUNTER — Inpatient Hospital Stay: Payer: Medicaid Other

## 2020-07-11 DIAGNOSIS — T50905A Adverse effect of unspecified drugs, medicaments and biological substances, initial encounter: Secondary | ICD-10-CM

## 2020-07-11 DIAGNOSIS — D6481 Anemia due to antineoplastic chemotherapy: Secondary | ICD-10-CM | POA: Diagnosis not present

## 2020-07-11 DIAGNOSIS — Z5111 Encounter for antineoplastic chemotherapy: Secondary | ICD-10-CM | POA: Insufficient documentation

## 2020-07-11 DIAGNOSIS — D3911 Neoplasm of uncertain behavior of right ovary: Secondary | ICD-10-CM | POA: Diagnosis not present

## 2020-07-11 DIAGNOSIS — Z23 Encounter for immunization: Secondary | ICD-10-CM

## 2020-07-11 DIAGNOSIS — R739 Hyperglycemia, unspecified: Secondary | ICD-10-CM

## 2020-07-11 DIAGNOSIS — T451X5A Adverse effect of antineoplastic and immunosuppressive drugs, initial encounter: Secondary | ICD-10-CM | POA: Diagnosis not present

## 2020-07-11 DIAGNOSIS — C561 Malignant neoplasm of right ovary: Secondary | ICD-10-CM | POA: Diagnosis present

## 2020-07-11 LAB — CMP (CANCER CENTER ONLY)
ALT: 11 U/L (ref 0–44)
AST: 10 U/L — ABNORMAL LOW (ref 15–41)
Albumin: 3.8 g/dL (ref 3.5–5.0)
Alkaline Phosphatase: 81 U/L (ref 38–126)
Anion gap: 13 (ref 5–15)
BUN: 9 mg/dL (ref 6–20)
CO2: 20 mmol/L — ABNORMAL LOW (ref 22–32)
Calcium: 9.1 mg/dL (ref 8.9–10.3)
Chloride: 106 mmol/L (ref 98–111)
Creatinine: 0.82 mg/dL (ref 0.44–1.00)
GFR, Estimated: >60 mL/min (ref >60)
Glucose, Bld: 193 mg/dL — ABNORMAL HIGH (ref 70–99)
Potassium: 4.1 mmol/L (ref 3.5–5.1)
Sodium: 139 mmol/L (ref 135–145)
Total Bilirubin: <0.2 mg/dL — ABNORMAL LOW (ref 0.3–1.2)
Total Protein: 8 g/dL (ref 6.5–8.1)

## 2020-07-11 LAB — CBC WITH DIFFERENTIAL (CANCER CENTER ONLY)
Abs Immature Granulocytes: 0.01 10*3/uL (ref 0.00–0.07)
Basophils Absolute: 0 10*3/uL (ref 0.0–0.1)
Basophils Relative: 0 %
Eosinophils Absolute: 0 10*3/uL (ref 0.0–0.5)
Eosinophils Relative: 0 %
HCT: 33.6 % — ABNORMAL LOW (ref 36.0–46.0)
Hemoglobin: 10.8 g/dL — ABNORMAL LOW (ref 12.0–15.0)
Immature Granulocytes: 0 %
Lymphocytes Relative: 8 %
Lymphs Abs: 0.6 10*3/uL — ABNORMAL LOW (ref 0.7–4.0)
MCH: 26.6 pg (ref 26.0–34.0)
MCHC: 32.1 g/dL (ref 30.0–36.0)
MCV: 82.8 fL (ref 80.0–100.0)
Monocytes Absolute: 0.1 10*3/uL (ref 0.1–1.0)
Monocytes Relative: 1 %
Neutro Abs: 7.5 10*3/uL (ref 1.7–7.7)
Neutrophils Relative %: 91 %
Platelet Count: 234 10*3/uL (ref 150–400)
RBC: 4.06 MIL/uL (ref 3.87–5.11)
RDW: 15.2 % (ref 11.5–15.5)
WBC Count: 8.2 10*3/uL (ref 4.0–10.5)
nRBC: 0 % (ref 0.0–0.2)

## 2020-07-11 LAB — PREGNANCY, URINE: Preg Test, Ur: NEGATIVE

## 2020-07-11 MED ORDER — SODIUM CHLORIDE 0.9 % IV SOLN
150.0000 mg | Freq: Once | INTRAVENOUS | Status: AC
  Administered 2020-07-11: 150 mg via INTRAVENOUS
  Filled 2020-07-11: qty 150

## 2020-07-11 MED ORDER — DEXAMETHASONE SODIUM PHOSPHATE 100 MG/10ML IJ SOLN
10.0000 mg | Freq: Once | INTRAMUSCULAR | Status: AC
  Administered 2020-07-11: 10 mg via INTRAVENOUS
  Filled 2020-07-11: qty 10

## 2020-07-11 MED ORDER — DIPHENHYDRAMINE HCL 50 MG/ML IJ SOLN
25.0000 mg | Freq: Once | INTRAMUSCULAR | Status: AC
  Administered 2020-07-11: 25 mg via INTRAVENOUS

## 2020-07-11 MED ORDER — DIPHENHYDRAMINE HCL 50 MG/ML IJ SOLN
INTRAMUSCULAR | Status: AC
  Filled 2020-07-11: qty 1

## 2020-07-11 MED ORDER — FAMOTIDINE IN NACL 20-0.9 MG/50ML-% IV SOLN
20.0000 mg | Freq: Once | INTRAVENOUS | Status: AC
  Administered 2020-07-11: 20 mg via INTRAVENOUS

## 2020-07-11 MED ORDER — SODIUM CHLORIDE 0.9 % IV SOLN
710.0000 mg | Freq: Once | INTRAVENOUS | Status: AC
  Administered 2020-07-11: 710 mg via INTRAVENOUS
  Filled 2020-07-11: qty 71

## 2020-07-11 MED ORDER — SODIUM CHLORIDE 0.9 % IV SOLN
Freq: Once | INTRAVENOUS | Status: AC
  Filled 2020-07-11: qty 250

## 2020-07-11 MED ORDER — FAMOTIDINE IN NACL 20-0.9 MG/50ML-% IV SOLN
INTRAVENOUS | Status: AC
  Filled 2020-07-11: qty 50

## 2020-07-11 MED ORDER — PALONOSETRON HCL INJECTION 0.25 MG/5ML
INTRAVENOUS | Status: AC
  Filled 2020-07-11: qty 5

## 2020-07-11 MED ORDER — PACLITAXEL CHEMO INJECTION 300 MG/50ML
175.0000 mg/m2 | Freq: Once | INTRAVENOUS | Status: AC
  Administered 2020-07-11: 282 mg via INTRAVENOUS
  Filled 2020-07-11: qty 47

## 2020-07-11 MED ORDER — PALONOSETRON HCL INJECTION 0.25 MG/5ML
0.2500 mg | Freq: Once | INTRAVENOUS | Status: AC
Start: 2020-07-11 — End: 2020-07-11
  Administered 2020-07-11: 0.25 mg via INTRAVENOUS

## 2020-07-11 NOTE — Assessment & Plan Note (Signed)
Her blood sugar is mildly elevated likely due to steroids Observe closely for now

## 2020-07-11 NOTE — Progress Notes (Signed)
[facility] OFFICE PROGRESS NOTE  Patient Care Team: Patient, No Pcp Per (Inactive) as PCP - General (General Practice) Awanda Mink, Craige Cotta, RN as Oncology Nurse Navigator (Oncology)  ASSESSMENT & PLAN:  Granulosa cell tumor of ovary, right Overall, she tolerated treatment very well except for some mild bone aches, anemia and constipation which are not unexpected side effects of treatment We will proceed with treatment as scheduled   Anemia due to antineoplastic chemotherapy This is likely due to recent treatment. The patient denies recent history of bleeding such as epistaxis, hematuria or hematochezia. She is asymptomatic from the anemia. I will observe for now.  She does not require transfusion now. I will continue the chemotherapy at current dose without dosage adjustment.  If the anemia gets progressive worse in the future, I might have to delay her treatment or adjust the chemotherapy dose.   Drug-induced hyperglycemia Her blood sugar is mildly elevated likely due to steroids Observe closely for now   No orders of the defined types were placed in this encounter.   All questions were answered. The patient knows to call the clinic with any problems, questions or concerns. The total time spent in the appointment was 20 minutes encounter with patients including review of chart and various tests results, discussions about plan of care and coordination of care plan   Heath Lark, MD 07/11/2020 1:34 PM  INTERVAL HISTORY: Please see below for problem oriented charting. She returns for treatment and follow-up She tolerated recent treatment well Denies neuropathy She has mild changes in bowel habits and bony aches and pain but overall felt tolerable  SUMMARY OF ONCOLOGIC HISTORY: Oncology History Overview Note  1/14: inhibin B - 3,543 Inhibin A - 43  Presented initially on 04/21/20 with suspected pregnancy, increasing abdominal growth. CT imaging concerning for [facility]  syndrome with ovarian mass, massive abdominal ascites and right pleural effusion.   Granulosa cell tumor of ovary, right  04/21/2020 Imaging   CT A/P: 1. 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 Ovarian lesion is typically a fibroma in this setting. There is a cleavage plane between the adnexal mass and the uterus, which makes a pedunculated fibroid unlikely. 2. There is a probable nabothian cyst in the cervix. 3. Ascites causes mass effect on the abdominopelvic structures   04/22/2020 Procedure   Paracentesis, 9L  FINAL MICROSCOPIC DIAGNOSIS:  - Atypical cells present  - See comment.    04/23/2020 Procedure   Thoracentesis, 2L  FINAL MICROSCOPIC DIAGNOSIS:  - Reactive mesothelial cells present   04/27/2020 Surgery   Robotic-assisted lysis of adhesions, left salpingoo-oophorectomy, laparotomy for specimen removal, ileocecal resection and reanastomosis, appendectomy, omentectomy  Findings: On EUA, small mobile uterus with mass that moves in junction with the uterus, sitting out of the pelvis and palpable with the abdominal hand. Mass is firm and adherent to the anterior abdominal wall. On intra-abdominal entry, approximately 6L of green-tinged ascites noted and removed. Normal appearing upper abdominal survey. Normal appearing omentum. 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 zsome pockets of degeneration that are unavoidably entered during gentle manipulation of the ovary. Mass with dense adhesions to the anterior abdominal wall on the right, the right sidewall, and several loops of ileum. Uterus 8cm and normal appearing. Right fallopian tube and ovary without evidence of disease. Some inflammatory rind noted on the small and large bowel. Given size of mass and  its suspected degeneration and poor integrity, unable to place in an Endocatch bag  and midline excision extended for specimen removal. Given concern for thermal damage to the terminal ileum from lyssi of dense adhesions, decision made to proceed with ileocectomy. 1-1.5cm mesenteric node palpated within the mesentery removed. Some shotty sub centimeter mesenteric nodes palpated. No para-aortic or pelvic lymphadenopathy. Liver edge, diaphragm and stomach smooth. Omentum without evidence of disease. An additional 3L of ascites was removed over the course of the surgery.   04/27/2020 Pathology Results   A. OVARY AND FALLOPIAN TUBE, LEFT, SALPINGO OOPHORECTOMY:  - Granulosa cell tumor, adult type, multiple fragments measuring 18.5 cm  in aggregate  - No evidence of ovarian surface involvement  - Benign unremarkable fallopian tube  - See oncology table   B. ILEOCECUM AND APPENDIX, RESECTION:  - Segment of terminal ileum (9 cm) and colon (10 cm) showing focal  serosal disruption  - Unremarkable appendix  - Margins appear viable  - Benign lymph nodes  - No evidence of malignancy   C. OMENTUM, RESECTION:  - Portion of omentum with congestion and mild mesothelial hyperplasia  - No evidence of malignancy   OVARY or FALLOPIAN TUBE or PRIMARY PERITONEUM: Resection   Procedure: Salpingo-oophorectomy  Specimen Integrity: Fragmented  Tumor Site: Left ovary  Tumor Size: Multiple tissue fragments measured 18.5 cm in aggregate  Histologic Type: Granulosa cell tumor, adult type  Histologic Grade: Not applicable  Ovarian Surface Involvement: Not identified  Fallopian Tube Surface Involvement: Not identified  Implants (required for advanced stage serous/seromucinous borderline  tumors only): Not applicable  Other Tissue/ Organ Involvement: Not applicable  Largest Extrapelvic Peritoneal Focus: Not identified  Peritoneal/Ascitic Fluid Involvement: Not identified ([id number])  Chemotherapy Response Score (CRS): Not applicable, no known presurgical  therapy  Regional Lymph Nodes: Not  applicable (no lymph nodes submitted or found)  Distant Metastasis:       Distant Site(s) Involved: Not applicable  Pathologic Stage Classification (pTNM, AJCC 8th Edition): pT1c, pN not  assigned (no nodes submitted or found).  See comment  Ancillary Studies: Can be performed upon request  Representative Tumor Block: A2  Comment(s): The tumor stage is due to intraoperative surgical spill of  the tumor cells based on discussion with the surgeon Dr. Jeral Pinch.   04/27/2020 Cancer Staging   Staging form: Ovary, Fallopian Tube, and Primary Peritoneal Carcinoma, AJCC 8th Edition - Clinical stage from 04/27/2020: FIGO Stage IC1, calculated as Stage IC (cT1c1, cN0, cM0) - Signed by Lafonda Mosses, MD on 05/05/2020   05/05/2020 Initial Diagnosis   Granulosa cell tumor of ovary, right   05/30/2020 -  Chemotherapy    Patient is on Treatment Plan: OVARIAN CARBOPLATIN (AUC 6) / PACLITAXEL (175) Q21D X 6 CYCLES      06/21/2020 Tumor Marker   Patient's tumor was tested for the following markers: Inhibin B Results of the tumor marker test revealed 17.5     REVIEW OF SYSTEMS:   Constitutional: Denies fevers, chills or abnormal weight loss Eyes: Denies blurriness of vision Ears, nose, mouth, throat, and face: Denies mucositis or sore throat Respiratory: Denies cough, dyspnea or wheezes Cardiovascular: Denies palpitation, chest discomfort or lower extremity swelling Gastrointestinal:  Denies nausea, heartburn or change in bowel habits Skin: Denies abnormal skin rashes Lymphatics: Denies new lymphadenopathy or easy bruising Neurological:Denies numbness, tingling or new weaknesses Behavioral/Psych: Mood is stable, no new changes  All other systems were reviewed with the patient and are negative.  I have  reviewed the past medical history, past surgical history, social history and family history with the patient and they are unchanged from previous note.  ALLERGIES:  has No Known  Allergies.  MEDICATIONS:  Current Outpatient Medications  Medication Sig Dispense Refill  . dexamethasone (DECADRON) 4 MG tablet Take 2 tabs at the night before and 2 tab the morning of chemotherapy, every 3 weeks, by mouth x 6 cycles 36 tablet 6  . ondansetron (ZOFRAN) 8 MG tablet Take 1 tablet (8 mg total) by mouth every 8 (eight) hours as needed for refractory nausea / vomiting. Start on day 3 after carboplatin chemo. 30 tablet 1  . prochlorperazine (COMPAZINE) 10 MG tablet Take 1 tablet (10 mg total) by mouth every 6 (six) hours as needed (Nausea or vomiting). 30 tablet 1   No current facility-administered medications for this visit.   Facility-Administered Medications Ordered in Other Visits  Medication Dose Route Frequency Provider Last Rate Last Admin  . CARBOplatin (PARAPLATIN) 710 mg in sodium chloride 0.9 % 250 mL chemo infusion  710 mg Intravenous Once Alvy Bimler, Lorane Cousar, MD      . PACLitaxel (TAXOL) 282 mg in sodium chloride 0.9 % 250 mL chemo infusion (> 80mg /m2)  175 mg/m2 (Treatment Plan Recorded) Intravenous Once Heath Lark, MD 99 mL/hr at 07/11/20 1304 282 mg at 07/11/20 1304    PHYSICAL EXAMINATION: ECOG PERFORMANCE STATUS: 1 - Symptomatic but completely ambulatory  Vitals:   07/11/20 1011  BP: 129/79  Pulse: 77  Resp: 18  Temp: 97.8 F (36.6 C)  SpO2: 100%   Filed Weights   07/11/20 1011  Weight: 139 lb 3.2 oz (63.1 kg)    GENERAL:alert, no distress and comfortable SKIN: skin color, texture, turgor are normal, no rashes or significant lesions EYES: normal, Conjunctiva are pink and non-injected, sclera clear OROPHARYNX:no exudate, no erythema and lips, buccal mucosa, and tongue normal  NECK: supple, thyroid normal size, non-tender, without nodularity LYMPH:  no palpable lymphadenopathy in the cervical, axillary or inguinal LUNGS: clear to auscultation and percussion with normal breathing effort HEART: regular rate & rhythm and no murmurs and no lower extremity  edema ABDOMEN:abdomen soft, non-tender and normal bowel sounds Musculoskeletal:no cyanosis of digits and no clubbing  NEURO: alert & oriented x 3 with fluent speech, no focal motor/sensory deficits  LABORATORY DATA:  I have reviewed the data as listed    Component Value Date/Time   NA 139 07/11/2020 0949   K 4.1 07/11/2020 0949   CL 106 07/11/2020 0949   CO2 20 (L) 07/11/2020 0949   GLUCOSE 193 (H) 07/11/2020 0949   BUN 9 07/11/2020 0949   CREATININE 0.82 07/11/2020 0949   CALCIUM 9.1 07/11/2020 0949   PROT 8.0 07/11/2020 0949   ALBUMIN 3.8 07/11/2020 0949   AST 10 (L) 07/11/2020 0949   ALT 11 07/11/2020 0949   ALKPHOS 81 07/11/2020 0949   BILITOT <0.2 (L) 07/11/2020 0949   GFRNONAA >60 07/11/2020 0949    No results found for: SPEP, UPEP  Lab Results  Component Value Date   WBC 8.2 07/11/2020   NEUTROABS 7.5 07/11/2020   HGB 10.8 (L) 07/11/2020   HCT 33.6 (L) 07/11/2020   MCV 82.8 07/11/2020   PLT 234 07/11/2020      Chemistry      Component Value Date/Time   NA 139 07/11/2020 0949   K 4.1 07/11/2020 0949   CL 106 07/11/2020 0949   CO2 20 (L) 07/11/2020 0949   BUN 9 07/11/2020 0949  CREATININE 0.82 07/11/2020 0949      Component Value Date/Time   CALCIUM 9.1 07/11/2020 0949   ALKPHOS 81 07/11/2020 0949   AST 10 (L) 07/11/2020 0949   ALT 11 07/11/2020 0949   BILITOT <0.2 (L) 07/11/2020 9323

## 2020-07-11 NOTE — Assessment & Plan Note (Signed)
Overall, she tolerated treatment very well except for some mild bone aches, anemia and constipation which are not unexpected side effects of treatment We will proceed with treatment as scheduled

## 2020-07-11 NOTE — Patient Instructions (Signed)
   [facility] Discharge Instructions for Patients Receiving Chemotherapy  Today you received the following chemotherapy agents Taxol and Carboplatin   To help prevent nausea and vomiting after your treatment, we encourage you to take your nausea medication as directed.    If you develop nausea and vomiting that is not controlled by your nausea medication, call the clinic.   BELOW ARE SYMPTOMS THAT SHOULD BE REPORTED IMMEDIATELY:  *FEVER GREATER THAN 100.5 F  *CHILLS WITH OR WITHOUT FEVER  NAUSEA AND VOMITING THAT IS NOT CONTROLLED WITH YOUR NAUSEA MEDICATION  *UNUSUAL SHORTNESS OF BREATH  *UNUSUAL BRUISING OR BLEEDING  TENDERNESS IN MOUTH AND THROAT WITH OR WITHOUT PRESENCE OF ULCERS  *URINARY PROBLEMS  *BOWEL PROBLEMS  UNUSUAL RASH Items with * indicate a potential emergency and should be followed up as soon as possible.  Feel free to call the clinic should you have any questions or concerns. The clinic phone number is [phone].  Please show the CHEMO ALERT CARD at check-in to the Emergency Department and triage nurse.   

## 2020-07-11 NOTE — Assessment & Plan Note (Signed)

## 2020-08-01 ENCOUNTER — Other Ambulatory Visit: Payer: Self-pay | Admitting: Hematology and Oncology

## 2020-08-01 ENCOUNTER — Other Ambulatory Visit: Payer: Self-pay

## 2020-08-01 ENCOUNTER — Inpatient Hospital Stay: Payer: Medicaid Other

## 2020-08-01 ENCOUNTER — Inpatient Hospital Stay (HOSPITAL_BASED_OUTPATIENT_CLINIC_OR_DEPARTMENT_OTHER): Payer: Medicaid Other | Admitting: Hematology and Oncology

## 2020-08-01 ENCOUNTER — Encounter: Payer: Self-pay | Admitting: Hematology and Oncology

## 2020-08-01 DIAGNOSIS — D3911 Neoplasm of uncertain behavior of right ovary: Secondary | ICD-10-CM | POA: Diagnosis not present

## 2020-08-01 DIAGNOSIS — D6481 Anemia due to antineoplastic chemotherapy: Secondary | ICD-10-CM | POA: Diagnosis not present

## 2020-08-01 DIAGNOSIS — Z5111 Encounter for antineoplastic chemotherapy: Secondary | ICD-10-CM | POA: Diagnosis not present

## 2020-08-01 DIAGNOSIS — R739 Hyperglycemia, unspecified: Secondary | ICD-10-CM

## 2020-08-01 DIAGNOSIS — T451X5A Adverse effect of antineoplastic and immunosuppressive drugs, initial encounter: Secondary | ICD-10-CM

## 2020-08-01 DIAGNOSIS — Z23 Encounter for immunization: Secondary | ICD-10-CM

## 2020-08-01 DIAGNOSIS — D539 Nutritional anemia, unspecified: Secondary | ICD-10-CM

## 2020-08-01 DIAGNOSIS — T50905A Adverse effect of unspecified drugs, medicaments and biological substances, initial encounter: Secondary | ICD-10-CM

## 2020-08-01 LAB — CBC WITH DIFFERENTIAL (CANCER CENTER ONLY)
Abs Immature Granulocytes: 0.04 10*3/uL (ref 0.00–0.07)
Basophils Absolute: 0 10*3/uL (ref 0.0–0.1)
Basophils Relative: 0 %
Eosinophils Absolute: 0 10*3/uL (ref 0.0–0.5)
Eosinophils Relative: 0 %
HCT: 35.6 % — ABNORMAL LOW (ref 36.0–46.0)
Hemoglobin: 11.5 g/dL — ABNORMAL LOW (ref 12.0–15.0)
Immature Granulocytes: 1 %
Lymphocytes Relative: 8 %
Lymphs Abs: 0.7 10*3/uL (ref 0.7–4.0)
MCH: 26.9 pg (ref 26.0–34.0)
MCHC: 32.3 g/dL (ref 30.0–36.0)
MCV: 83.2 fL (ref 80.0–100.0)
Monocytes Absolute: 0.1 10*3/uL (ref 0.1–1.0)
Monocytes Relative: 2 %
Neutro Abs: 7.8 10*3/uL — ABNORMAL HIGH (ref 1.7–7.7)
Neutrophils Relative %: 89 %
Platelet Count: 164 10*3/uL (ref 150–400)
RBC: 4.28 MIL/uL (ref 3.87–5.11)
RDW: 15.3 % (ref 11.5–15.5)
WBC Count: 8.7 10*3/uL (ref 4.0–10.5)
nRBC: 0 % (ref 0.0–0.2)

## 2020-08-01 LAB — CMP (CANCER CENTER ONLY)
ALT: 10 U/L (ref 0–44)
AST: 12 U/L — ABNORMAL LOW (ref 15–41)
Albumin: 3.8 g/dL (ref 3.5–5.0)
Alkaline Phosphatase: 75 U/L (ref 38–126)
Anion gap: 10 (ref 5–15)
BUN: 8 mg/dL (ref 6–20)
CO2: 23 mmol/L (ref 22–32)
Calcium: 9.8 mg/dL (ref 8.9–10.3)
Chloride: 105 mmol/L (ref 98–111)
Creatinine: 0.84 mg/dL (ref 0.44–1.00)
GFR, Estimated: >60 mL/min (ref >60)
Glucose, Bld: 155 mg/dL — ABNORMAL HIGH (ref 70–99)
Potassium: 4.1 mmol/L (ref 3.5–5.1)
Sodium: 138 mmol/L (ref 135–145)
Total Bilirubin: <0.2 mg/dL — ABNORMAL LOW (ref 0.3–1.2)
Total Protein: 8.2 g/dL — ABNORMAL HIGH (ref 6.5–8.1)

## 2020-08-01 LAB — PREGNANCY, URINE: Preg Test, Ur: NEGATIVE

## 2020-08-01 MED ORDER — FAMOTIDINE IN NACL 20-0.9 MG/50ML-% IV SOLN
INTRAVENOUS | Status: AC
  Filled 2020-08-01: qty 50

## 2020-08-01 MED ORDER — SODIUM CHLORIDE 0.9 % IV SOLN
282.0000 mg | Freq: Once | INTRAVENOUS | Status: AC
  Administered 2020-08-01: 282 mg via INTRAVENOUS
  Filled 2020-08-01: qty 47

## 2020-08-01 MED ORDER — PALONOSETRON HCL INJECTION 0.25 MG/5ML
0.2500 mg | Freq: Once | INTRAVENOUS | Status: AC
  Administered 2020-08-01: 0.25 mg via INTRAVENOUS

## 2020-08-01 MED ORDER — SODIUM CHLORIDE 0.9 % IV SOLN
710.0000 mg | Freq: Once | INTRAVENOUS | Status: AC
  Administered 2020-08-01: 710 mg via INTRAVENOUS
  Filled 2020-08-01: qty 71

## 2020-08-01 MED ORDER — SODIUM CHLORIDE 0.9 % IV SOLN
150.0000 mg | Freq: Once | INTRAVENOUS | Status: AC
  Administered 2020-08-01: 150 mg via INTRAVENOUS
  Filled 2020-08-01: qty 150

## 2020-08-01 MED ORDER — DIPHENHYDRAMINE HCL 50 MG/ML IJ SOLN
INTRAMUSCULAR | Status: AC
  Filled 2020-08-01: qty 1

## 2020-08-01 MED ORDER — FAMOTIDINE IN NACL 20-0.9 MG/50ML-% IV SOLN
20.0000 mg | Freq: Once | INTRAVENOUS | Status: AC
  Administered 2020-08-01: 20 mg via INTRAVENOUS

## 2020-08-01 MED ORDER — PALONOSETRON HCL INJECTION 0.25 MG/5ML
INTRAVENOUS | Status: AC
  Filled 2020-08-01: qty 5

## 2020-08-01 MED ORDER — SODIUM CHLORIDE 0.9 % IV SOLN
Freq: Once | INTRAVENOUS | Status: AC
  Filled 2020-08-01: qty 250

## 2020-08-01 MED ORDER — DIPHENHYDRAMINE HCL 50 MG/ML IJ SOLN
25.0000 mg | Freq: Once | INTRAMUSCULAR | Status: AC
  Administered 2020-08-01: 25 mg via INTRAVENOUS

## 2020-08-01 MED ORDER — SODIUM CHLORIDE 0.9 % IV SOLN
10.0000 mg | Freq: Once | INTRAVENOUS | Status: AC
  Administered 2020-08-01: 10 mg via INTRAVENOUS
  Filled 2020-08-01: qty 10

## 2020-08-01 NOTE — Assessment & Plan Note (Signed)
Her blood sugar is mildly elevated likely due to steroids Observe closely for now

## 2020-08-01 NOTE — Assessment & Plan Note (Signed)
Overall, she tolerated treatment very well except for some mild bone aches and anemia which are not unexpected side effects of treatment We will proceed with treatment as scheduled

## 2020-08-01 NOTE — Patient Instructions (Signed)
[facility]  Discharge Instructions: Thank you for choosing [facility] to provide your oncology and hematology care.   If you have a lab appointment with the [facility], please go directly to the [facility] and check in at the registration area.   Wear comfortable clothing and clothing appropriate for easy access to any Portacath or PICC line.   We strive to give you quality time with your provider. You may need to reschedule your appointment if you arrive late (15 or more minutes).  Arriving late affects you and other patients whose appointments are after yours.  Also, if you miss three or more appointments without notifying the office, you may be dismissed from the clinic at the provider's discretion.      For prescription refill requests, have your pharmacy contact our office and allow 72 hours for refills to be completed.    Today you received the following chemotherapy and/or immunotherapy agents paraplatin, paclitaxel   To help prevent nausea and vomiting after your treatment, we encourage you to take your nausea medication as directed.  BELOW ARE SYMPTOMS THAT SHOULD BE REPORTED IMMEDIATELY: . *FEVER GREATER THAN 100.4 F (38 C) OR HIGHER . *CHILLS OR SWEATING . *NAUSEA AND VOMITING THAT IS NOT CONTROLLED WITH YOUR NAUSEA MEDICATION . *UNUSUAL SHORTNESS OF BREATH . *UNUSUAL BRUISING OR BLEEDING . *URINARY PROBLEMS (pain or burning when urinating, or frequent urination) . *BOWEL PROBLEMS (unusual diarrhea, constipation, pain near the anus) . TENDERNESS IN MOUTH AND THROAT WITH OR WITHOUT PRESENCE OF ULCERS (sore throat, sores in mouth, or a toothache) . UNUSUAL RASH, SWELLING OR PAIN  . UNUSUAL VAGINAL DISCHARGE OR ITCHING   Items with * indicate a potential emergency and should be followed up as soon as possible or go to the Emergency Department if any problems should occur.  Please show the CHEMOTHERAPY ALERT CARD or  IMMUNOTHERAPY ALERT CARD at check-in to the Emergency Department and triage nurse.  Should you have questions after your visit or need to cancel or reschedule your appointment, please contact [facility]  Dept: [phone]  and follow the prompts.  Office hours are 8:00 a.m. to 4:30 p.m. Monday - Friday. Please note that voicemails left after 4:00 p.m. may not be returned until the following business day.  We are closed weekends and major holidays. You have access to a nurse at all times for urgent questions. Please call the main number to the clinic Dept: [phone] and follow the prompts.   For any non-urgent questions, you may also contact your provider using MyChart. We now offer e-Visits for anyone 71 and older to request care online for non-urgent symptoms. For details visit [email or web address].   Also download the MyChart app! Go to the app store, search "MyChart", open the app, select [facility], and log in with your MyChart username and password.  Due to Covid, a mask is required upon entering the hospital/clinic. If you do not have a mask, one will be given to you upon arrival. For doctor visits, patients may have 1 support person aged 71 or older with them. For treatment visits, patients cannot have anyone with them due to current Covid guidelines and our immunocompromised population.

## 2020-08-01 NOTE — Assessment & Plan Note (Signed)

## 2020-08-01 NOTE — Progress Notes (Signed)
[place] OFFICE PROGRESS NOTE  Patient Care Team: Patient, No Pcp Per (Inactive) as PCP - General (General Practice) Awanda Mink, Craige Cotta, RN as Oncology Nurse Navigator (Oncology)  ASSESSMENT & PLAN:  Granulosa cell tumor of ovary, right Overall, she tolerated treatment very well except for some mild bone aches and anemia which are not unexpected side effects of treatment We will proceed with treatment as scheduled   Anemia due to antineoplastic chemotherapy This is likely due to recent treatment. The patient denies recent history of bleeding such as epistaxis, hematuria or hematochezia. She is asymptomatic from the anemia. I will observe for now.  She does not require transfusion now. I will continue the chemotherapy at current dose without dosage adjustment.  If the anemia gets progressive worse in the future, I might have to delay her treatment or adjust the chemotherapy dose.   Drug-induced hyperglycemia Her blood sugar is mildly elevated likely due to steroids Observe closely for now   No orders of the defined types were placed in this encounter.   All questions were answered. The patient knows to call the clinic with any problems, questions or concerns. The total time spent in the appointment was 20 minutes encounter with patients including review of chart and various tests results, discussions about plan of care and coordination of care plan   Heath Lark, MD 08/01/2020 12:56 PM  INTERVAL HISTORY: Please see below for problem oriented charting. She returns for treatment and follow-up She tolerated treatment well She has some mild bone aches after treatment that is self-limiting No neuropathy  SUMMARY OF ONCOLOGIC HISTORY: Oncology History Overview Note  1/14: inhibin B - 3,543 Inhibin A - 36  Presented initially on 04/21/20 with suspected pregnancy, increasing abdominal growth. 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   Granulosa cell tumor of ovary, right  04/21/2020 Imaging   CT A/P: 1. 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 Ovarian lesion is typically a fibroma in this setting. There is a cleavage plane between the adnexal mass and the uterus, which makes a pedunculated fibroid unlikely. 2. There is a probable nabothian cyst in the cervix. 3. Ascites causes mass effect on the abdominopelvic structures   04/22/2020 Procedure   Paracentesis, 9L  FINAL MICROSCOPIC DIAGNOSIS:  - Atypical cells present  - See comment.    04/23/2020 Procedure   Thoracentesis, 2L  FINAL MICROSCOPIC DIAGNOSIS:  - Reactive mesothelial cells present   04/27/2020 Surgery   Robotic-assisted lysis of adhesions, left salpingoo-oophorectomy, laparotomy for specimen removal, ileocecal resection and reanastomosis, appendectomy, omentectomy  Findings: On EUA, small mobile uterus with mass that moves in junction with the uterus, sitting out of the pelvis and palpable with the abdominal hand. Mass is firm and adherent to the anterior abdominal wall. On intra-abdominal entry, approximately 6L of green-tinged ascites noted and removed. Normal appearing upper abdominal survey. Normal appearing omentum. 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 zsome pockets of degeneration that are unavoidably entered during gentle manipulation of the ovary. Mass with dense adhesions to the anterior abdominal wall on the right, the right sidewall, and several loops of ileum. Uterus 8cm and normal appearing. Right fallopian tube and ovary without evidence of disease. Some inflammatory rind noted on the small and large bowel. Given size of mass and its suspected degeneration and poor integrity, unable  to place in an Endocatch bag and midline excision extended for specimen  removal. Given concern for thermal damage to the terminal ileum from lyssi of dense adhesions, decision made to proceed with ileocectomy. 1-1.5cm mesenteric node palpated within the mesentery removed. Some shotty sub centimeter mesenteric nodes palpated. No para-aortic or pelvic lymphadenopathy. Liver edge, diaphragm and stomach smooth. Omentum without evidence of disease. An additional 3L of ascites was removed over the course of the surgery.   04/27/2020 Pathology Results   A. OVARY AND FALLOPIAN TUBE, LEFT, SALPINGO OOPHORECTOMY:  - Granulosa cell tumor, adult type, multiple fragments measuring 18.5 cm  in aggregate  - No evidence of ovarian surface involvement  - Benign unremarkable fallopian tube  - See oncology table   B. ILEOCECUM AND APPENDIX, RESECTION:  - Segment of terminal ileum (9 cm) and colon (10 cm) showing focal  serosal disruption  - Unremarkable appendix  - Margins appear viable  - Benign lymph nodes  - No evidence of malignancy   C. OMENTUM, RESECTION:  - Portion of omentum with congestion and mild mesothelial hyperplasia  - No evidence of malignancy   OVARY or FALLOPIAN TUBE or PRIMARY PERITONEUM: Resection   Procedure: Salpingo-oophorectomy  Specimen Integrity: Fragmented  Tumor Site: Left ovary  Tumor Size: Multiple tissue fragments measured 18.5 cm in aggregate  Histologic Type: Granulosa cell tumor, adult type  Histologic Grade: Not applicable  Ovarian Surface Involvement: Not identified  Fallopian Tube Surface Involvement: Not identified  Implants (required for advanced stage serous/seromucinous borderline  tumors only): Not applicable  Other Tissue/ Organ Involvement: Not applicable  Largest Extrapelvic Peritoneal Focus: Not identified  Peritoneal/Ascitic Fluid Involvement: Not identified ([id number])  Chemotherapy Response Score (CRS): Not applicable, no known presurgical  therapy  Regional Lymph Nodes: Not applicable (no lymph nodes submitted or found)   Distant Metastasis:       Distant Site(s) Involved: Not applicable  Pathologic Stage Classification (pTNM, AJCC 8th Edition): pT1c, pN not  assigned (no nodes submitted or found).  See comment  Ancillary Studies: Can be performed upon request  Representative Tumor Block: A2  Comment(s): The tumor stage is due to intraoperative surgical spill of  the tumor cells based on discussion with the surgeon Dr. Jeral Pinch.   04/27/2020 Cancer Staging   Staging form: Ovary, Fallopian Tube, and Primary Peritoneal Carcinoma, AJCC 8th Edition - Clinical stage from 04/27/2020: FIGO Stage IC1, calculated as Stage IC (cT1c1, cN0, cM0) - Signed by Lafonda Mosses, MD on 05/05/2020   05/05/2020 Initial Diagnosis   Granulosa cell tumor of ovary, right   05/30/2020 -  Chemotherapy    Patient is on Treatment Plan: OVARIAN CARBOPLATIN (AUC 6) / PACLITAXEL (175) Q21D X 6 CYCLES      06/21/2020 Tumor Marker   Patient's tumor was tested for the following markers: Inhibin B Results of the tumor marker test revealed 17.5     REVIEW OF SYSTEMS:   Constitutional: Denies fevers, chills or abnormal weight loss Eyes: Denies blurriness of vision Ears, nose, mouth, throat, and face: Denies mucositis or sore throat Respiratory: Denies cough, dyspnea or wheezes Cardiovascular: Denies palpitation, chest discomfort or lower extremity swelling Gastrointestinal:  Denies nausea, heartburn or change in bowel habits Skin: Denies abnormal skin rashes Lymphatics: Denies new lymphadenopathy or easy bruising Neurological:Denies numbness, tingling or new weaknesses Behavioral/Psych: Mood is stable, no new changes  All other systems were reviewed with the patient and are negative.  I have reviewed the past medical history, past surgical  history, social history and family history with the patient and they are unchanged from previous note.  ALLERGIES:  has No Known Allergies.  MEDICATIONS:  Current Outpatient  Medications  Medication Sig Dispense Refill  . dexamethasone (DECADRON) 4 MG tablet Take 2 tabs at the night before and 2 tab the morning of chemotherapy, every 3 weeks, by mouth x 6 cycles 36 tablet 6  . ondansetron (ZOFRAN) 8 MG tablet Take 1 tablet (8 mg total) by mouth every 8 (eight) hours as needed for refractory nausea / vomiting. Start on day 3 after carboplatin chemo. 30 tablet 1  . prochlorperazine (COMPAZINE) 10 MG tablet Take 1 tablet (10 mg total) by mouth every 6 (six) hours as needed (Nausea or vomiting). 30 tablet 1   No current facility-administered medications for this visit.   Facility-Administered Medications Ordered in Other Visits  Medication Dose Route Frequency Provider Last Rate Last Admin  . CARBOplatin (PARAPLATIN) 710 mg in sodium chloride 0.9 % 250 mL chemo infusion  710 mg Intravenous Once Alvy Bimler, Denard Tuminello, MD      . PACLitaxel (TAXOL) 282 mg in sodium chloride 0.9 % 250 mL chemo infusion (> 80mg /m2)  282 mg Intravenous Once Alvy Bimler, Timya Trimmer, MD        PHYSICAL EXAMINATION: ECOG PERFORMANCE STATUS: 1 - Symptomatic but completely ambulatory  Vitals:   08/01/20 1037  BP: 117/71  Pulse: 82  Resp: 18  Temp: (!) 97.4 F (36.3 C)  SpO2: 100%   Filed Weights   08/01/20 1037  Weight: 139 lb 3.2 oz (63.1 kg)    GENERAL:alert, no distress and comfortable SKIN: skin color, texture, turgor are normal, no rashes or significant lesions EYES: normal, Conjunctiva are pink and non-injected, sclera clear OROPHARYNX:no exudate, no erythema and lips, buccal mucosa, and tongue normal  NECK: supple, thyroid normal size, non-tender, without nodularity LYMPH:  no palpable lymphadenopathy in the cervical, axillary or inguinal LUNGS: clear to auscultation and percussion with normal breathing effort HEART: regular rate & rhythm and no murmurs and no lower extremity edema ABDOMEN:abdomen soft, non-tender and normal bowel sounds Musculoskeletal:no cyanosis of digits and no clubbing   NEURO: alert & oriented x 3 with fluent speech, no focal motor/sensory deficits  LABORATORY DATA:  I have reviewed the data as listed    Component Value Date/Time   NA 138 08/01/2020 1007   K 4.1 08/01/2020 1007   CL 105 08/01/2020 1007   CO2 23 08/01/2020 1007   GLUCOSE 155 (H) 08/01/2020 1007   BUN 8 08/01/2020 1007   CREATININE 0.84 08/01/2020 1007   CALCIUM 9.8 08/01/2020 1007   PROT 8.2 (H) 08/01/2020 1007   ALBUMIN 3.8 08/01/2020 1007   AST 12 (L) 08/01/2020 1007   ALT 10 08/01/2020 1007   ALKPHOS 75 08/01/2020 1007   BILITOT <0.2 (L) 08/01/2020 1007   GFRNONAA >60 08/01/2020 1007    No results found for: SPEP, UPEP  Lab Results  Component Value Date   WBC 8.7 08/01/2020   NEUTROABS 7.8 (H) 08/01/2020   HGB 11.5 (L) 08/01/2020   HCT 35.6 (L) 08/01/2020   MCV 83.2 08/01/2020   PLT 164 08/01/2020      Chemistry      Component Value Date/Time   NA 138 08/01/2020 1007   K 4.1 08/01/2020 1007   CL 105 08/01/2020 1007   CO2 23 08/01/2020 1007   BUN 8 08/01/2020 1007   CREATININE 0.84 08/01/2020 1007      Component Value Date/Time  CALCIUM 9.8 08/01/2020 1007   ALKPHOS 75 08/01/2020 1007   AST 12 (L) 08/01/2020 1007   ALT 10 08/01/2020 1007   BILITOT <0.2 (L) 08/01/2020 1007

## 2020-08-02 LAB — INHIBIN B: Inhibin B: 187.9 pg/mL

## 2020-08-22 ENCOUNTER — Inpatient Hospital Stay (HOSPITAL_BASED_OUTPATIENT_CLINIC_OR_DEPARTMENT_OTHER): Payer: Medicaid Other | Admitting: Hematology and Oncology

## 2020-08-22 ENCOUNTER — Inpatient Hospital Stay: Payer: Medicaid Other

## 2020-08-22 ENCOUNTER — Other Ambulatory Visit: Payer: Self-pay

## 2020-08-22 ENCOUNTER — Encounter: Payer: Self-pay | Admitting: Hematology and Oncology

## 2020-08-22 ENCOUNTER — Inpatient Hospital Stay: Payer: Medicaid Other | Attending: Gynecologic Oncology

## 2020-08-22 DIAGNOSIS — Z79899 Other long term (current) drug therapy: Secondary | ICD-10-CM | POA: Diagnosis not present

## 2020-08-22 DIAGNOSIS — D3911 Neoplasm of uncertain behavior of right ovary: Secondary | ICD-10-CM | POA: Diagnosis not present

## 2020-08-22 DIAGNOSIS — Z7952 Long term (current) use of systemic steroids: Secondary | ICD-10-CM | POA: Insufficient documentation

## 2020-08-22 DIAGNOSIS — D6481 Anemia due to antineoplastic chemotherapy: Secondary | ICD-10-CM | POA: Diagnosis not present

## 2020-08-22 DIAGNOSIS — T451X5A Adverse effect of antineoplastic and immunosuppressive drugs, initial encounter: Secondary | ICD-10-CM

## 2020-08-22 DIAGNOSIS — D539 Nutritional anemia, unspecified: Secondary | ICD-10-CM

## 2020-08-22 DIAGNOSIS — R739 Hyperglycemia, unspecified: Secondary | ICD-10-CM | POA: Diagnosis not present

## 2020-08-22 DIAGNOSIS — Z5111 Encounter for antineoplastic chemotherapy: Secondary | ICD-10-CM | POA: Insufficient documentation

## 2020-08-22 DIAGNOSIS — C561 Malignant neoplasm of right ovary: Secondary | ICD-10-CM | POA: Insufficient documentation

## 2020-08-22 DIAGNOSIS — T50905A Adverse effect of unspecified drugs, medicaments and biological substances, initial encounter: Secondary | ICD-10-CM

## 2020-08-22 DIAGNOSIS — Z23 Encounter for immunization: Secondary | ICD-10-CM

## 2020-08-22 LAB — CBC WITH DIFFERENTIAL (CANCER CENTER ONLY)
Abs Immature Granulocytes: 0.02 10*3/uL (ref 0.00–0.07)
Basophils Absolute: 0 10*3/uL (ref 0.0–0.1)
Basophils Relative: 0 %
Eosinophils Absolute: 0 10*3/uL (ref 0.0–0.5)
Eosinophils Relative: 0 %
HCT: 33 % — ABNORMAL LOW (ref 36.0–46.0)
Hemoglobin: 10.5 g/dL — ABNORMAL LOW (ref 12.0–15.0)
Immature Granulocytes: 0 %
Lymphocytes Relative: 9 %
Lymphs Abs: 0.5 10*3/uL — ABNORMAL LOW (ref 0.7–4.0)
MCH: 26.8 pg (ref 26.0–34.0)
MCHC: 31.8 g/dL (ref 30.0–36.0)
MCV: 84.2 fL (ref 80.0–100.0)
Monocytes Absolute: 0.1 10*3/uL (ref 0.1–1.0)
Monocytes Relative: 2 %
Neutro Abs: 5.2 10*3/uL (ref 1.7–7.7)
Neutrophils Relative %: 89 %
Platelet Count: 163 10*3/uL (ref 150–400)
RBC: 3.92 MIL/uL (ref 3.87–5.11)
RDW: 15.7 % — ABNORMAL HIGH (ref 11.5–15.5)
WBC Count: 5.9 10*3/uL (ref 4.0–10.5)
nRBC: 0 % (ref 0.0–0.2)

## 2020-08-22 LAB — CMP (CANCER CENTER ONLY)
ALT: 10 U/L (ref 0–44)
AST: 13 U/L — ABNORMAL LOW (ref 15–41)
Albumin: 3.6 g/dL (ref 3.5–5.0)
Alkaline Phosphatase: 66 U/L (ref 38–126)
Anion gap: 9 (ref 5–15)
BUN: 9 mg/dL (ref 6–20)
CO2: 23 mmol/L (ref 22–32)
Calcium: 9.6 mg/dL (ref 8.9–10.3)
Chloride: 106 mmol/L (ref 98–111)
Creatinine: 0.8 mg/dL (ref 0.44–1.00)
GFR, Estimated: >60 mL/min (ref >60)
Glucose, Bld: 160 mg/dL — ABNORMAL HIGH (ref 70–99)
Potassium: 4.1 mmol/L (ref 3.5–5.1)
Sodium: 138 mmol/L (ref 135–145)
Total Bilirubin: 0.3 mg/dL (ref 0.3–1.2)
Total Protein: 7.8 g/dL (ref 6.5–8.1)

## 2020-08-22 LAB — PREGNANCY, URINE: Preg Test, Ur: NEGATIVE

## 2020-08-22 MED ORDER — FOSAPREPITANT DIMEGLUMINE INJECTION 150 MG
150.0000 mg | Freq: Once | INTRAVENOUS | Status: AC
  Administered 2020-08-22: 150 mg via INTRAVENOUS
  Filled 2020-08-22: qty 150

## 2020-08-22 MED ORDER — FAMOTIDINE 20 MG IN NS 100 ML IVPB
20.0000 mg | Freq: Once | INTRAVENOUS | Status: AC
  Administered 2020-08-22: 20 mg via INTRAVENOUS

## 2020-08-22 MED ORDER — SODIUM CHLORIDE 0.9 % IV SOLN
Freq: Once | INTRAVENOUS | Status: AC
  Filled 2020-08-22: qty 250

## 2020-08-22 MED ORDER — PALONOSETRON HCL INJECTION 0.25 MG/5ML
0.2500 mg | Freq: Once | INTRAVENOUS | Status: AC
  Administered 2020-08-22: 0.25 mg via INTRAVENOUS

## 2020-08-22 MED ORDER — SODIUM CHLORIDE 0.9 % IV SOLN
10.0000 mg | Freq: Once | INTRAVENOUS | Status: AC
  Administered 2020-08-22: 10 mg via INTRAVENOUS
  Filled 2020-08-22: qty 10

## 2020-08-22 MED ORDER — SODIUM CHLORIDE 0.9 % IV SOLN
300.0000 mg | Freq: Once | INTRAVENOUS | Status: AC
  Administered 2020-08-22: 300 mg via INTRAVENOUS
  Filled 2020-08-22: qty 50

## 2020-08-22 MED ORDER — FAMOTIDINE 20 MG IN NS 100 ML IVPB
INTRAVENOUS | Status: AC
  Filled 2020-08-22: qty 100

## 2020-08-22 MED ORDER — PALONOSETRON HCL INJECTION 0.25 MG/5ML
INTRAVENOUS | Status: AC
  Filled 2020-08-22: qty 5

## 2020-08-22 MED ORDER — SODIUM CHLORIDE 0.9 % IV SOLN: 710.0000 mg | Freq: Once | INTRAVENOUS | Status: DC

## 2020-08-22 MED ORDER — DIPHENHYDRAMINE HCL 50 MG/ML IJ SOLN
INTRAMUSCULAR | Status: AC
  Filled 2020-08-22: qty 1

## 2020-08-22 MED ORDER — SODIUM CHLORIDE 0.9 % IV SOLN
750.0000 mg | Freq: Once | INTRAVENOUS | Status: AC
  Administered 2020-08-22: 750 mg via INTRAVENOUS
  Filled 2020-08-22: qty 75

## 2020-08-22 MED ORDER — SODIUM CHLORIDE 0.9 % IV SOLN
282.0000 mg | Freq: Once | INTRAVENOUS | Status: DC
  Filled 2020-08-22: qty 47

## 2020-08-22 MED ORDER — DIPHENHYDRAMINE HCL 50 MG/ML IJ SOLN
25.0000 mg | Freq: Once | INTRAMUSCULAR | Status: AC
Start: 2020-08-22 — End: 2020-08-22
  Administered 2020-08-22: 25 mg via INTRAVENOUS

## 2020-08-22 NOTE — Progress Notes (Signed)
MD would like to change chemo doses to Taxol 300mg  and Carboplatin 750mg  for cycle 5.  Raul Del [place], [place], BCPS, BCOP 08/22/2020 10:29 AM

## 2020-08-22 NOTE — Assessment & Plan Note (Signed)

## 2020-08-22 NOTE — Progress Notes (Signed)
[place] OFFICE PROGRESS NOTE  Patient Care Team: Patient, No Pcp Per (Inactive) as PCP - General (General Practice) Awanda Mink, Craige Cotta, RN as Oncology Nurse Navigator (Oncology)  ASSESSMENT & PLAN:  Granulosa cell tumor of ovary, right Overall, she tolerated treatment very well except for some mild bone aches and anemia which are not unexpected side effects of treatment With recent weight gain, her calculated dose of chemotherapy came back a bit high I would keep maximum dose of carboplatin 750 mg We will proceed with treatment as scheduled   Anemia due to antineoplastic chemotherapy This is likely due to recent treatment. The patient denies recent history of bleeding such as epistaxis, hematuria or hematochezia. She is asymptomatic from the anemia. I will observe for now.  She does not require transfusion now. I will continue the chemotherapy at current dose without dosage adjustment.  If the anemia gets progressive worse in the future, I might have to delay her treatment or adjust the chemotherapy dose.   Drug-induced hyperglycemia Her blood sugar is mildly elevated likely due to steroids Observe closely for now   No orders of the defined types were placed in this encounter.   All questions were answered. The patient knows to call the clinic with any problems, questions or concerns. The total time spent in the appointment was 20 minutes encounter with patients including review of chart and various tests results, discussions about plan of care and coordination of care plan   Heath Lark, MD 08/22/2020 10:26 AM  INTERVAL HISTORY: Please see below for problem oriented charting. She returns for further follow-up She tolerated treatment very well No peripheral neuropathy Some occasional mild aches The patient denies any recent signs or symptoms of bleeding such as spontaneous epistaxis, hematuria or hematochezia. No nausea or changes in bowel habits  SUMMARY OF  ONCOLOGIC HISTORY: Oncology History Overview Note  1/14: inhibin B - 3,543 Inhibin A - 71  Presented initially on 04/21/20 with suspected pregnancy, increasing abdominal growth. 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   Granulosa cell tumor of ovary, right  04/21/2020 Imaging   CT A/P: 1. 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 Ovarian lesion is typically a fibroma in this setting. There is a cleavage plane between the adnexal mass and the uterus, which makes a pedunculated fibroid unlikely. 2. There is a probable nabothian cyst in the cervix. 3. Ascites causes mass effect on the abdominopelvic structures   04/22/2020 Procedure   Paracentesis, 9L  FINAL MICROSCOPIC DIAGNOSIS:  - Atypical cells present  - See comment.    04/23/2020 Procedure   Thoracentesis, 2L  FINAL MICROSCOPIC DIAGNOSIS:  - Reactive mesothelial cells present   04/27/2020 Surgery   Robotic-assisted lysis of adhesions, left salpingoo-oophorectomy, laparotomy for specimen removal, ileocecal resection and reanastomosis, appendectomy, omentectomy  Findings: On EUA, small mobile uterus with mass that moves in junction with the uterus, sitting out of the pelvis and palpable with the abdominal hand. Mass is firm and adherent to the anterior abdominal wall. On intra-abdominal entry, approximately 6L of green-tinged ascites noted and removed. Normal appearing upper abdominal survey. Normal appearing omentum. 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 zsome pockets of degeneration that are unavoidably entered during gentle manipulation of the ovary. Mass with dense adhesions to the anterior abdominal wall on the right, the right sidewall,  and several loops of ileum. Uterus 8cm and normal appearing. Right  fallopian tube and ovary without evidence of disease. Some inflammatory rind noted on the small and large bowel. Given size of mass and its suspected degeneration and poor integrity, unable to place in an Endocatch bag and midline excision extended for specimen removal. Given concern for thermal damage to the terminal ileum from lyssi of dense adhesions, decision made to proceed with ileocectomy. 1-1.5cm mesenteric node palpated within the mesentery removed. Some shotty sub centimeter mesenteric nodes palpated. No para-aortic or pelvic lymphadenopathy. Liver edge, diaphragm and stomach smooth. Omentum without evidence of disease. An additional 3L of ascites was removed over the course of the surgery.   04/27/2020 Pathology Results   A. OVARY AND FALLOPIAN TUBE, LEFT, SALPINGO OOPHORECTOMY:  - Granulosa cell tumor, adult type, multiple fragments measuring 18.5 cm  in aggregate  - No evidence of ovarian surface involvement  - Benign unremarkable fallopian tube  - See oncology table   B. ILEOCECUM AND APPENDIX, RESECTION:  - Segment of terminal ileum (9 cm) and colon (10 cm) showing focal  serosal disruption  - Unremarkable appendix  - Margins appear viable  - Benign lymph nodes  - No evidence of malignancy   C. OMENTUM, RESECTION:  - Portion of omentum with congestion and mild mesothelial hyperplasia  - No evidence of malignancy   OVARY or FALLOPIAN TUBE or PRIMARY PERITONEUM: Resection   Procedure: Salpingo-oophorectomy  Specimen Integrity: Fragmented  Tumor Site: Left ovary  Tumor Size: Multiple tissue fragments measured 18.5 cm in aggregate  Histologic Type: Granulosa cell tumor, adult type  Histologic Grade: Not applicable  Ovarian Surface Involvement: Not identified  Fallopian Tube Surface Involvement: Not identified  Implants (required for advanced stage serous/seromucinous borderline  tumors only): Not applicable  Other Tissue/ Organ Involvement: Not applicable  Largest  Extrapelvic Peritoneal Focus: Not identified  Peritoneal/Ascitic Fluid Involvement: Not identified ([id number])  Chemotherapy Response Score (CRS): Not applicable, no known presurgical  therapy  Regional Lymph Nodes: Not applicable (no lymph nodes submitted or found)  Distant Metastasis:       Distant Site(s) Involved: Not applicable  Pathologic Stage Classification (pTNM, AJCC 8th Edition): pT1c, pN not  assigned (no nodes submitted or found).  See comment  Ancillary Studies: Can be performed upon request  Representative Tumor Block: A2  Comment(s): The tumor stage is due to intraoperative surgical spill of  the tumor cells based on discussion with the surgeon Dr. Jeral Pinch.   04/27/2020 Cancer Staging   Staging form: Ovary, Fallopian Tube, and Primary Peritoneal Carcinoma, AJCC 8th Edition - Clinical stage from 04/27/2020: FIGO Stage IC1, calculated as Stage IC (cT1c1, cN0, cM0) - Signed by Lafonda Mosses, MD on 05/05/2020   05/05/2020 Initial Diagnosis   Granulosa cell tumor of ovary, right   05/30/2020 -  Chemotherapy    Patient is on Treatment Plan: OVARIAN CARBOPLATIN (AUC 6) / PACLITAXEL (175) Q21D X 6 CYCLES      06/21/2020 Tumor Marker   Patient's tumor was tested for the following markers: Inhibin B Results of the tumor marker test revealed 17.5     REVIEW OF SYSTEMS:   Constitutional: Denies fevers, chills or abnormal weight loss Eyes: Denies blurriness of vision Ears, nose, mouth, throat, and face: Denies mucositis or sore throat Respiratory: Denies cough, dyspnea or wheezes Cardiovascular: Denies palpitation, chest discomfort or lower extremity swelling Gastrointestinal:  Denies nausea, heartburn or change in bowel habits Skin: Denies abnormal skin rashes Lymphatics:  Denies new lymphadenopathy or easy bruising Neurological:Denies numbness, tingling or new weaknesses Behavioral/Psych: Mood is stable, no new changes  All other systems were reviewed with the  patient and are negative.  I have reviewed the past medical history, past surgical history, social history and family history with the patient and they are unchanged from previous note.  ALLERGIES:  has No Known Allergies.  MEDICATIONS:  Current Outpatient Medications  Medication Sig Dispense Refill  . dexamethasone (DECADRON) 4 MG tablet Take 2 tabs at the night before and 2 tab the morning of chemotherapy, every 3 weeks, by mouth x 6 cycles 36 tablet 6  . ondansetron (ZOFRAN) 8 MG tablet Take 1 tablet (8 mg total) by mouth every 8 (eight) hours as needed for refractory nausea / vomiting. Start on day 3 after carboplatin chemo. 30 tablet 1  . prochlorperazine (COMPAZINE) 10 MG tablet Take 1 tablet (10 mg total) by mouth every 6 (six) hours as needed (Nausea or vomiting). 30 tablet 1   No current facility-administered medications for this visit.   Facility-Administered Medications Ordered in Other Visits  Medication Dose Route Frequency Provider Last Rate Last Admin  . CARBOplatin (PARAPLATIN) 710 mg in sodium chloride 0.9 % 250 mL chemo infusion  710 mg Intravenous Once Alvy Bimler, Jonus Coble, MD      . dexamethasone (DECADRON) 10 mg in sodium chloride 0.9 % 50 mL IVPB  10 mg Intravenous Once Alvy Bimler, Jacorie Ernsberger, MD      . diphenhydrAMINE (BENADRYL) injection 25 mg  25 mg Intravenous Once Alvy Bimler, Chinwe Lope, MD      . famotidine (PEPCID) IVPB 20 mg in NS 100 mL IVPB  20 mg Intravenous Once Alvy Bimler, Gerlene Glassburn, MD      . fosaprepitant (EMEND) 150 mg in sodium chloride 0.9 % 145 mL IVPB  150 mg Intravenous Once Alvy Bimler, Malacki Mcphearson, MD      . PACLitaxel (TAXOL) 282 mg in sodium chloride 0.9 % 250 mL chemo infusion (> 80mg /m2)  282 mg Intravenous Once Alvy Bimler, Dory Demont, MD      . palonosetron (ALOXI) injection 0.25 mg  0.25 mg Intravenous Once Alvy Bimler, Joshus Rogan, MD        PHYSICAL EXAMINATION: ECOG PERFORMANCE STATUS: 0 - Asymptomatic  Vitals:   08/22/20 0916  BP: 122/77  Pulse: 78  Resp: 18  Temp: (!) 97.5 F (36.4 C)  SpO2: 100%    Filed Weights   08/22/20 0916  Weight: 146 lb (66.2 kg)    GENERAL:alert, no distress and comfortable SKIN: skin color, texture, turgor are normal, no rashes or significant lesions EYES: normal, Conjunctiva are pink and non-injected, sclera clear OROPHARYNX:no exudate, no erythema and lips, buccal mucosa, and tongue normal  NECK: supple, thyroid normal size, non-tender, without nodularity LYMPH:  no palpable lymphadenopathy in the cervical, axillary or inguinal LUNGS: clear to auscultation and percussion with normal breathing effort HEART: regular rate & rhythm and no murmurs and no lower extremity edema ABDOMEN:abdomen soft, non-tender and normal bowel sounds Musculoskeletal:no cyanosis of digits and no clubbing  NEURO: alert & oriented x 3 with fluent speech, no focal motor/sensory deficits  LABORATORY DATA:  I have reviewed the data as listed    Component Value Date/Time   NA 138 08/22/2020 0853   K 4.1 08/22/2020 0853   CL 106 08/22/2020 0853   CO2 23 08/22/2020 0853   GLUCOSE 160 (H) 08/22/2020 0853   BUN 9 08/22/2020 0853   CREATININE 0.80 08/22/2020 0853   CALCIUM 9.6 08/22/2020 0853   PROT 7.8  08/22/2020 0853   ALBUMIN 3.6 08/22/2020 0853   AST 13 (L) 08/22/2020 0853   ALT 10 08/22/2020 0853   ALKPHOS 66 08/22/2020 0853   BILITOT 0.3 08/22/2020 0853   GFRNONAA >60 08/22/2020 0853    No results found for: SPEP, UPEP  Lab Results  Component Value Date   WBC 5.9 08/22/2020   NEUTROABS 5.2 08/22/2020   HGB 10.5 (L) 08/22/2020   HCT 33.0 (L) 08/22/2020   MCV 84.2 08/22/2020   PLT 163 08/22/2020      Chemistry      Component Value Date/Time   NA 138 08/22/2020 0853   K 4.1 08/22/2020 0853   CL 106 08/22/2020 0853   CO2 23 08/22/2020 0853   BUN 9 08/22/2020 0853   CREATININE 0.80 08/22/2020 0853      Component Value Date/Time   CALCIUM 9.6 08/22/2020 0853   ALKPHOS 66 08/22/2020 0853   AST 13 (L) 08/22/2020 0853   ALT 10 08/22/2020 0853   BILITOT  0.3 08/22/2020 9562

## 2020-08-22 NOTE — Patient Instructions (Signed)
[facility]  Discharge Instructions: °Thank you for choosing [facility] to provide your oncology and hematology care.  ° °If you have a lab appointment with the Cancer Center, please go directly to the Cancer Center and check in at the registration area. °  °Wear comfortable clothing and clothing appropriate for easy access to any Portacath or PICC line.  ° °We strive to give you quality time with your provider. You may need to reschedule your appointment if you arrive late (15 or more minutes).  Arriving late affects you and other patients whose appointments are after yours.  Also, if you miss three or more appointments without notifying the office, you may be dismissed from the clinic at the provider’s discretion.    °  °For prescription refill requests, have your pharmacy contact our office and allow 72 hours for refills to be completed.   ° °Today you received the following chemotherapy and/or immunotherapy agents paclitaxel, carboplatin    °  °To help prevent nausea and vomiting after your treatment, we encourage you to take your nausea medication as directed. ° °BELOW ARE SYMPTOMS THAT SHOULD BE REPORTED IMMEDIATELY: °*FEVER GREATER THAN 100.4 F (38 °C) OR HIGHER °*CHILLS OR SWEATING °*NAUSEA AND VOMITING THAT IS NOT CONTROLLED WITH YOUR NAUSEA MEDICATION °*UNUSUAL SHORTNESS OF BREATH °*UNUSUAL BRUISING OR BLEEDING °*URINARY PROBLEMS (pain or burning when urinating, or frequent urination) °*BOWEL PROBLEMS (unusual diarrhea, constipation, pain near the anus) °TENDERNESS IN MOUTH AND THROAT WITH OR WITHOUT PRESENCE OF ULCERS (sore throat, sores in mouth, or a toothache) °UNUSUAL RASH, SWELLING OR PAIN  °UNUSUAL VAGINAL DISCHARGE OR ITCHING  ° °Items with * indicate a potential emergency and should be followed up as soon as possible or go to the Emergency Department if any problems should occur. ° °Please show the CHEMOTHERAPY ALERT CARD or IMMUNOTHERAPY ALERT CARD at  check-in to the Emergency Department and triage nurse. ° °Should you have questions after your visit or need to cancel or reschedule your appointment, please contact [facility]  Dept: [phone]  and follow the prompts.  Office hours are 8:00 a.m. to 4:30 p.m. Monday - Friday. Please note that voicemails left after 4:00 p.m. may not be returned until the following business day.  We are closed weekends and major holidays. You have access to a nurse at all times for urgent questions. Please call the main number to the clinic Dept: [phone] and follow the prompts. ° ° °For any non-urgent questions, you may also contact your provider using MyChart. We now offer e-Visits for anyone 18 and older to request care online for non-urgent symptoms. For details visit [email or web address]. °  °Also download the MyChart app! Go to the app store, search "MyChart", open the app, select , and log in with your MyChart username and password. ° °Due to Covid, a mask is required upon entering the hospital/clinic. If you do not have a mask, one will be given to you upon arrival. For doctor visits, patients may have 1 support person aged 18 or older with them. For treatment visits, patients cannot have anyone with them due to current Covid guidelines and our immunocompromised population.  ° °

## 2020-08-22 NOTE — Progress Notes (Signed)
After removing IV, area above catheter was slightly edematous and red. Pt states no pain or irritation. Ice applied. Pt instructed to apply ice 4x/day and contact infusion center if any pain or change occurred.

## 2020-08-22 NOTE — Assessment & Plan Note (Signed)
Her blood sugar is mildly elevated likely due to steroids Observe closely for now

## 2020-08-22 NOTE — Assessment & Plan Note (Signed)
Overall, she tolerated treatment very well except for some mild bone aches and anemia which are not unexpected side effects of treatment With recent weight gain, her calculated dose of chemotherapy came back a bit high I would keep maximum dose of carboplatin 750 mg We will proceed with treatment as scheduled

## 2020-08-23 LAB — INHIBIN B: Inhibin B: 132.7 pg/mL

## 2020-09-12 ENCOUNTER — Encounter: Payer: Self-pay | Admitting: Hematology and Oncology

## 2020-09-12 ENCOUNTER — Inpatient Hospital Stay (HOSPITAL_BASED_OUTPATIENT_CLINIC_OR_DEPARTMENT_OTHER): Payer: Medicaid Other | Admitting: Hematology and Oncology

## 2020-09-12 ENCOUNTER — Inpatient Hospital Stay: Payer: Medicaid Other | Attending: Gynecologic Oncology

## 2020-09-12 ENCOUNTER — Inpatient Hospital Stay: Payer: Medicaid Other

## 2020-09-12 ENCOUNTER — Other Ambulatory Visit: Payer: Self-pay

## 2020-09-12 VITALS — BP 112/72 | HR 73 | Temp 97.5°F | Resp 18 | Ht 62.0 in | Wt 146.8 lb

## 2020-09-12 DIAGNOSIS — D3911 Neoplasm of uncertain behavior of right ovary: Secondary | ICD-10-CM

## 2020-09-12 DIAGNOSIS — Z90721 Acquired absence of ovaries, unilateral: Secondary | ICD-10-CM | POA: Insufficient documentation

## 2020-09-12 DIAGNOSIS — R739 Hyperglycemia, unspecified: Secondary | ICD-10-CM | POA: Insufficient documentation

## 2020-09-12 DIAGNOSIS — Z5111 Encounter for antineoplastic chemotherapy: Secondary | ICD-10-CM | POA: Insufficient documentation

## 2020-09-12 DIAGNOSIS — Z7952 Long term (current) use of systemic steroids: Secondary | ICD-10-CM | POA: Insufficient documentation

## 2020-09-12 DIAGNOSIS — D539 Nutritional anemia, unspecified: Secondary | ICD-10-CM | POA: Diagnosis not present

## 2020-09-12 DIAGNOSIS — Z23 Encounter for immunization: Secondary | ICD-10-CM

## 2020-09-12 DIAGNOSIS — C561 Malignant neoplasm of right ovary: Secondary | ICD-10-CM | POA: Insufficient documentation

## 2020-09-12 DIAGNOSIS — T50905A Adverse effect of unspecified drugs, medicaments and biological substances, initial encounter: Secondary | ICD-10-CM | POA: Diagnosis not present

## 2020-09-12 DIAGNOSIS — Z79899 Other long term (current) drug therapy: Secondary | ICD-10-CM | POA: Insufficient documentation

## 2020-09-12 LAB — CBC WITH DIFFERENTIAL (CANCER CENTER ONLY)
Abs Immature Granulocytes: 0.02 10*3/uL (ref 0.00–0.07)
Basophils Absolute: 0 10*3/uL (ref 0.0–0.1)
Basophils Relative: 0 %
Eosinophils Absolute: 0 10*3/uL (ref 0.0–0.5)
Eosinophils Relative: 0 %
HCT: 32.3 % — ABNORMAL LOW (ref 36.0–46.0)
Hemoglobin: 10.7 g/dL — ABNORMAL LOW (ref 12.0–15.0)
Immature Granulocytes: 0 %
Lymphocytes Relative: 10 %
Lymphs Abs: 0.5 10*3/uL — ABNORMAL LOW (ref 0.7–4.0)
MCH: 28.1 pg (ref 26.0–34.0)
MCHC: 33.1 g/dL (ref 30.0–36.0)
MCV: 84.8 fL (ref 80.0–100.0)
Monocytes Absolute: 0 10*3/uL — ABNORMAL LOW (ref 0.1–1.0)
Monocytes Relative: 1 %
Neutro Abs: 4.2 10*3/uL (ref 1.7–7.7)
Neutrophils Relative %: 89 %
Platelet Count: 144 10*3/uL — ABNORMAL LOW (ref 150–400)
RBC: 3.81 MIL/uL — ABNORMAL LOW (ref 3.87–5.11)
RDW: 16.1 % — ABNORMAL HIGH (ref 11.5–15.5)
WBC Count: 4.8 10*3/uL (ref 4.0–10.5)
nRBC: 0 % (ref 0.0–0.2)

## 2020-09-12 LAB — CMP (CANCER CENTER ONLY)
ALT: 14 U/L (ref 0–44)
AST: 11 U/L — ABNORMAL LOW (ref 15–41)
Albumin: 3.6 g/dL (ref 3.5–5.0)
Alkaline Phosphatase: 66 U/L (ref 38–126)
Anion gap: 10 (ref 5–15)
BUN: 13 mg/dL (ref 6–20)
CO2: 20 mmol/L — ABNORMAL LOW (ref 22–32)
Calcium: 9 mg/dL (ref 8.9–10.3)
Chloride: 108 mmol/L (ref 98–111)
Creatinine: 0.84 mg/dL (ref 0.44–1.00)
GFR, Estimated: >60 mL/min (ref >60)
Glucose, Bld: 172 mg/dL — ABNORMAL HIGH (ref 70–99)
Potassium: 4.2 mmol/L (ref 3.5–5.1)
Sodium: 138 mmol/L (ref 135–145)
Total Bilirubin: 0.3 mg/dL (ref 0.3–1.2)
Total Protein: 7.9 g/dL (ref 6.5–8.1)

## 2020-09-12 LAB — PREGNANCY, URINE: Preg Test, Ur: NEGATIVE

## 2020-09-12 MED ORDER — PALONOSETRON HCL INJECTION 0.25 MG/5ML
0.2500 mg | Freq: Once | INTRAVENOUS | Status: AC
  Administered 2020-09-12: 0.25 mg via INTRAVENOUS

## 2020-09-12 MED ORDER — DIPHENHYDRAMINE HCL 50 MG/ML IJ SOLN
INTRAMUSCULAR | Status: AC
  Filled 2020-09-12: qty 1

## 2020-09-12 MED ORDER — PALONOSETRON HCL INJECTION 0.25 MG/5ML
INTRAVENOUS | Status: AC
  Filled 2020-09-12: qty 5

## 2020-09-12 MED ORDER — FAMOTIDINE 20 MG IN NS 100 ML IVPB
20.0000 mg | Freq: Once | INTRAVENOUS | Status: AC
  Administered 2020-09-12: 20 mg via INTRAVENOUS

## 2020-09-12 MED ORDER — SODIUM CHLORIDE 0.9 % IV SOLN
Freq: Once | INTRAVENOUS | Status: AC
  Filled 2020-09-12: qty 250

## 2020-09-12 MED ORDER — SODIUM CHLORIDE 0.9 % IV SOLN
150.0000 mg | Freq: Once | INTRAVENOUS | Status: AC
  Administered 2020-09-12: 150 mg via INTRAVENOUS
  Filled 2020-09-12: qty 150

## 2020-09-12 MED ORDER — FAMOTIDINE 20 MG IN NS 100 ML IVPB
INTRAVENOUS | Status: AC
  Filled 2020-09-12: qty 100

## 2020-09-12 MED ORDER — DIPHENHYDRAMINE HCL 50 MG/ML IJ SOLN
25.0000 mg | Freq: Once | INTRAMUSCULAR | Status: AC
  Administered 2020-09-12: 25 mg via INTRAVENOUS

## 2020-09-12 MED ORDER — SODIUM CHLORIDE 0.9 % IV SOLN
750.0000 mg | Freq: Once | INTRAVENOUS | Status: AC
  Administered 2020-09-12: 750 mg via INTRAVENOUS
  Filled 2020-09-12: qty 75

## 2020-09-12 MED ORDER — SODIUM CHLORIDE 0.9 % IV SOLN
10.0000 mg | Freq: Once | INTRAVENOUS | Status: DC
  Filled 2020-09-12: qty 1

## 2020-09-12 MED ORDER — SODIUM CHLORIDE 0.9 % IV SOLN
175.0000 mg/m2 | Freq: Once | INTRAVENOUS | Status: AC
  Administered 2020-09-12: 300 mg via INTRAVENOUS
  Filled 2020-09-12: qty 50

## 2020-09-12 NOTE — Assessment & Plan Note (Signed)
Her blood sugar is mildly elevated likely due to steroids Observe closely for now

## 2020-09-12 NOTE — Assessment & Plan Note (Signed)
This is due to side effects of treatment She is not symptomatic Observe only 

## 2020-09-12 NOTE — Assessment & Plan Note (Signed)
Overall, she tolerated treatment very well with virtually no side effects except for very mild pancytopenia We will complete treatment today I plan to order CT imaging at the end of the month for objective assessment of response to treatment

## 2020-09-12 NOTE — Progress Notes (Signed)
[place] OFFICE PROGRESS NOTE  Patient Care Team: Patient, No Pcp Per (Inactive) as PCP - General (General Practice) Awanda Mink, Craige Cotta, RN as Oncology Nurse Navigator (Oncology)  ASSESSMENT & PLAN:  Granulosa cell tumor of ovary, right Overall, she tolerated treatment very well with virtually no side effects except for very mild pancytopenia We will complete treatment today I plan to order CT imaging at the end of the month for objective assessment of response to treatment   Deficiency anemia This is due to side effects of treatment She is not symptomatic Observe only  Drug-induced hyperglycemia Her blood sugar is mildly elevated likely due to steroids Observe closely for now   Orders Placed This Encounter  Procedures  . CT ABDOMEN PELVIS W CONTRAST    Standing Status:   Future    Standing Expiration Date:   09/12/2021    Order Specific Question:   If indicated for the ordered procedure, I authorize the administration of contrast media per Radiology protocol    Answer:   Yes    Order Specific Question:   Preferred imaging location?    Answer:   [facility]    Order Specific Question:   Radiology Contrast Protocol - do NOT remove file path    Answer:   [email or web address]    Order Specific Question:   Is patient pregnant?    Answer:   No    All questions were answered. The patient knows to call the clinic with any problems, questions or concerns. The total time spent in the appointment was 20 minutes encounter with patients including review of chart and various tests results, discussions about plan of care and coordination of care plan   Heath Lark, MD 09/12/2020 9:26 AM  INTERVAL HISTORY: Please see below for problem oriented charting. She returns for final cycle of chemotherapy She has no side effects from treatment such as nausea, changes in bowel habits or peripheral neuropathy Her energy level is  fair  SUMMARY OF ONCOLOGIC HISTORY: Oncology History Overview Note  1/14: inhibin B - 3,543 Inhibin A - 64  Presented initially on 04/21/20 with suspected pregnancy, increasing abdominal growth. 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   Granulosa cell tumor of ovary, right  04/21/2020 Imaging   CT A/P: 1. 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 Ovarian lesion is typically a fibroma in this setting. There is a cleavage plane between the adnexal mass and the uterus, which makes a pedunculated fibroid unlikely. 2. There is a probable nabothian cyst in the cervix. 3. Ascites causes mass effect on the abdominopelvic structures   04/22/2020 Procedure   Paracentesis, 9L  FINAL MICROSCOPIC DIAGNOSIS:  - Atypical cells present  - See comment.    04/23/2020 Procedure   Thoracentesis, 2L  FINAL MICROSCOPIC DIAGNOSIS:  - Reactive mesothelial cells present   04/27/2020 Surgery   Robotic-assisted lysis of adhesions, left salpingoo-oophorectomy, laparotomy for specimen removal, ileocecal resection and reanastomosis, appendectomy, omentectomy  Findings: On EUA, small mobile uterus with mass that moves in junction with the uterus, sitting out of the pelvis and palpable with the abdominal hand. Mass is firm and adherent to the anterior abdominal wall. On intra-abdominal entry, approximately 6L of green-tinged ascites noted and removed. Normal appearing upper abdominal survey. Normal appearing omentum. 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 zsome  pockets of degeneration that are unavoidably entered during gentle manipulation of the ovary. Mass with dense adhesions to the anterior abdominal wall on the right, the right sidewall, and several loops of ileum. Uterus 8cm and normal  appearing. Right fallopian tube and ovary without evidence of disease. Some inflammatory rind noted on the small and large bowel. Given size of mass and its suspected degeneration and poor integrity, unable to place in an Endocatch bag and midline excision extended for specimen removal. Given concern for thermal damage to the terminal ileum from lyssi of dense adhesions, decision made to proceed with ileocectomy. 1-1.5cm mesenteric node palpated within the mesentery removed. Some shotty sub centimeter mesenteric nodes palpated. No para-aortic or pelvic lymphadenopathy. Liver edge, diaphragm and stomach smooth. Omentum without evidence of disease. An additional 3L of ascites was removed over the course of the surgery.   04/27/2020 Pathology Results   A. OVARY AND FALLOPIAN TUBE, LEFT, SALPINGO OOPHORECTOMY:  - Granulosa cell tumor, adult type, multiple fragments measuring 18.5 cm  in aggregate  - No evidence of ovarian surface involvement  - Benign unremarkable fallopian tube  - See oncology table   B. ILEOCECUM AND APPENDIX, RESECTION:  - Segment of terminal ileum (9 cm) and colon (10 cm) showing focal  serosal disruption  - Unremarkable appendix  - Margins appear viable  - Benign lymph nodes  - No evidence of malignancy   C. OMENTUM, RESECTION:  - Portion of omentum with congestion and mild mesothelial hyperplasia  - No evidence of malignancy   OVARY or FALLOPIAN TUBE or PRIMARY PERITONEUM: Resection   Procedure: Salpingo-oophorectomy  Specimen Integrity: Fragmented  Tumor Site: Left ovary  Tumor Size: Multiple tissue fragments measured 18.5 cm in aggregate  Histologic Type: Granulosa cell tumor, adult type  Histologic Grade: Not applicable  Ovarian Surface Involvement: Not identified  Fallopian Tube Surface Involvement: Not identified  Implants (required for advanced stage serous/seromucinous borderline  tumors only): Not applicable  Other Tissue/ Organ Involvement: Not applicable   Largest Extrapelvic Peritoneal Focus: Not identified  Peritoneal/Ascitic Fluid Involvement: Not identified ([id number])  Chemotherapy Response Score (CRS): Not applicable, no known presurgical  therapy  Regional Lymph Nodes: Not applicable (no lymph nodes submitted or found)  Distant Metastasis:       Distant Site(s) Involved: Not applicable  Pathologic Stage Classification (pTNM, AJCC 8th Edition): pT1c, pN not  assigned (no nodes submitted or found).  See comment  Ancillary Studies: Can be performed upon request  Representative Tumor Block: A2  Comment(s): The tumor stage is due to intraoperative surgical spill of  the tumor cells based on discussion with the surgeon Dr. Jeral Pinch.   04/27/2020 Cancer Staging   Staging form: Ovary, Fallopian Tube, and Primary Peritoneal Carcinoma, AJCC 8th Edition - Clinical stage from 04/27/2020: FIGO Stage IC1, calculated as Stage IC (cT1c1, cN0, cM0) - Signed by Lafonda Mosses, MD on 05/05/2020   05/05/2020 Initial Diagnosis   Granulosa cell tumor of ovary, right   05/30/2020 -  Chemotherapy    Patient is on Treatment Plan: OVARIAN CARBOPLATIN (AUC 6) / PACLITAXEL (175) Q21D X 6 CYCLES      06/21/2020 Tumor Marker   Patient's tumor was tested for the following markers: Inhibin B Results of the tumor marker test revealed 17.5     REVIEW OF SYSTEMS:   Constitutional: Denies fevers, chills or abnormal weight loss Eyes: Denies blurriness of vision Ears, nose, mouth, throat, and face: Denies mucositis or sore throat Respiratory: Denies cough,  dyspnea or wheezes Cardiovascular: Denies palpitation, chest discomfort or lower extremity swelling Gastrointestinal:  Denies nausea, heartburn or change in bowel habits Skin: Denies abnormal skin rashes Lymphatics: Denies new lymphadenopathy or easy bruising Neurological:Denies numbness, tingling or new weaknesses Behavioral/Psych: Mood is stable, no new changes  All other systems were  reviewed with the patient and are negative.  I have reviewed the past medical history, past surgical history, social history and family history with the patient and they are unchanged from previous note.  ALLERGIES:  has No Known Allergies.  MEDICATIONS:  Current Outpatient Medications  Medication Sig Dispense Refill  . dexamethasone (DECADRON) 4 MG tablet Take 2 tabs at the night before and 2 tab the morning of chemotherapy, every 3 weeks, by mouth x 6 cycles 36 tablet 6  . ondansetron (ZOFRAN) 8 MG tablet Take 1 tablet (8 mg total) by mouth every 8 (eight) hours as needed for refractory nausea / vomiting. Start on day 3 after carboplatin chemo. 30 tablet 1  . prochlorperazine (COMPAZINE) 10 MG tablet Take 1 tablet (10 mg total) by mouth every 6 (six) hours as needed (Nausea or vomiting). 30 tablet 1   No current facility-administered medications for this visit.    PHYSICAL EXAMINATION: ECOG PERFORMANCE STATUS: 0 - Asymptomatic  Vitals:   09/12/20 0902  BP: 112/72  Pulse: 73  Resp: 18  Temp: (!) 97.5 F (36.4 C)  SpO2: 100%   Filed Weights   09/12/20 0902  Weight: 146 lb 12.8 oz (66.6 kg)    GENERAL:alert, no distress and comfortable SKIN: skin color, texture, turgor are normal, no rashes or significant lesions EYES: normal, Conjunctiva are pink and non-injected, sclera clear OROPHARYNX:no exudate, no erythema and lips, buccal mucosa, and tongue normal  NECK: supple, thyroid normal size, non-tender, without nodularity LYMPH:  no palpable lymphadenopathy in the cervical, axillary or inguinal LUNGS: clear to auscultation and percussion with normal breathing effort HEART: regular rate & rhythm and no murmurs and no lower extremity edema ABDOMEN:abdomen soft, non-tender and normal bowel sounds Musculoskeletal:no cyanosis of digits and no clubbing  NEURO: alert & oriented x 3 with fluent speech, no focal motor/sensory deficits  LABORATORY DATA:  I have reviewed the data as  listed    Component Value Date/Time   NA 138 09/12/2020 0825   K 4.2 09/12/2020 0825   CL 108 09/12/2020 0825   CO2 20 (L) 09/12/2020 0825   GLUCOSE 172 (H) 09/12/2020 0825   BUN 13 09/12/2020 0825   CREATININE 0.84 09/12/2020 0825   CALCIUM 9.0 09/12/2020 0825   PROT 7.9 09/12/2020 0825   ALBUMIN 3.6 09/12/2020 0825   AST 11 (L) 09/12/2020 0825   ALT 14 09/12/2020 0825   ALKPHOS 66 09/12/2020 0825   BILITOT 0.3 09/12/2020 0825   GFRNONAA >60 09/12/2020 0825    No results found for: SPEP, UPEP  Lab Results  Component Value Date   WBC 4.8 09/12/2020   NEUTROABS 4.2 09/12/2020   HGB 10.7 (L) 09/12/2020   HCT 32.3 (L) 09/12/2020   MCV 84.8 09/12/2020   PLT 144 (L) 09/12/2020      Chemistry      Component Value Date/Time   NA 138 09/12/2020 0825   K 4.2 09/12/2020 0825   CL 108 09/12/2020 0825   CO2 20 (L) 09/12/2020 0825   BUN 13 09/12/2020 0825   CREATININE 0.84 09/12/2020 0825      Component Value Date/Time   CALCIUM 9.0 09/12/2020 0825   ALKPHOS 66  09/12/2020 0825   AST 11 (L) 09/12/2020 0825   ALT 14 09/12/2020 0825   BILITOT 0.3 09/12/2020 0825

## 2020-09-12 NOTE — Patient Instructions (Signed)
[facility]  Discharge Instructions: °Thank you for choosing [facility] to provide your oncology and hematology care.  ° °If you have a lab appointment with the Cancer Center, please go directly to the Cancer Center and check in at the registration area. °  °Wear comfortable clothing and clothing appropriate for easy access to any Portacath or PICC line.  ° °We strive to give you quality time with your provider. You may need to reschedule your appointment if you arrive late (15 or more minutes).  Arriving late affects you and other patients whose appointments are after yours.  Also, if you miss three or more appointments without notifying the office, you may be dismissed from the clinic at the provider’s discretion.    °  °For prescription refill requests, have your pharmacy contact our office and allow 72 hours for refills to be completed.   ° °Today you received the following chemotherapy and/or immunotherapy agents paclitaxel, carboplatin    °  °To help prevent nausea and vomiting after your treatment, we encourage you to take your nausea medication as directed. ° °BELOW ARE SYMPTOMS THAT SHOULD BE REPORTED IMMEDIATELY: °*FEVER GREATER THAN 100.4 F (38 °C) OR HIGHER °*CHILLS OR SWEATING °*NAUSEA AND VOMITING THAT IS NOT CONTROLLED WITH YOUR NAUSEA MEDICATION °*UNUSUAL SHORTNESS OF BREATH °*UNUSUAL BRUISING OR BLEEDING °*URINARY PROBLEMS (pain or burning when urinating, or frequent urination) °*BOWEL PROBLEMS (unusual diarrhea, constipation, pain near the anus) °TENDERNESS IN MOUTH AND THROAT WITH OR WITHOUT PRESENCE OF ULCERS (sore throat, sores in mouth, or a toothache) °UNUSUAL RASH, SWELLING OR PAIN  °UNUSUAL VAGINAL DISCHARGE OR ITCHING  ° °Items with * indicate a potential emergency and should be followed up as soon as possible or go to the Emergency Department if any problems should occur. ° °Please show the CHEMOTHERAPY ALERT CARD or IMMUNOTHERAPY ALERT CARD at  check-in to the Emergency Department and triage nurse. ° °Should you have questions after your visit or need to cancel or reschedule your appointment, please contact [facility]  Dept: [phone]  and follow the prompts.  Office hours are 8:00 a.m. to 4:30 p.m. Monday - Friday. Please note that voicemails left after 4:00 p.m. may not be returned until the following business day.  We are closed weekends and major holidays. You have access to a nurse at all times for urgent questions. Please call the main number to the clinic Dept: [phone] and follow the prompts. ° ° °For any non-urgent questions, you may also contact your provider using MyChart. We now offer e-Visits for anyone 18 and older to request care online for non-urgent symptoms. For details visit [email or web address]. °  °Also download the MyChart app! Go to the app store, search "MyChart", open the app, select [place] [facility], and log in with your MyChart username and password. ° °Due to Covid, a mask is required upon entering the hospital/clinic. If you do not have a mask, one will be given to you upon arrival. For doctor visits, patients may have 1 support person aged 18 or older with them. For treatment visits, patients cannot have anyone with them due to current Covid guidelines and our immunocompromised population.  ° °

## 2020-09-28 ENCOUNTER — Encounter: Payer: Self-pay | Admitting: Hematology and Oncology

## 2020-09-28 NOTE — Progress Notes (Signed)
Pt is approved for the $1000 [employer] grant.  

## 2020-09-29 ENCOUNTER — Telehealth: Payer: Self-pay

## 2020-09-29 NOTE — Telephone Encounter (Signed)
-----   Message from Heath Lark, MD sent at 09/29/2020 11:00 AM EDT ----- Pls call her and cancel her appt We will reschedule once we have approvals

## 2020-09-29 NOTE — Telephone Encounter (Signed)
Called and canceled CT scan appt due to not having authorization. Will reschedule once we have authorization and reschedule. Brandy Knight verbalized understanding.

## 2020-10-03 ENCOUNTER — Ambulatory Visit (HOSPITAL_COMMUNITY): Payer: Medicaid Other

## 2020-10-03 ENCOUNTER — Other Ambulatory Visit: Payer: Medicaid Other

## 2020-10-04 ENCOUNTER — Ambulatory Visit: Payer: Medicaid Other | Admitting: Hematology and Oncology

## 2020-10-06 ENCOUNTER — Ambulatory Visit (HOSPITAL_COMMUNITY): Payer: Medicaid Other

## 2020-10-11 ENCOUNTER — Telehealth: Payer: Self-pay

## 2020-10-11 NOTE — Telephone Encounter (Signed)
-----   Message from Heath Lark, MD sent at 10/11/2020  8:41 AM EDT ----- Can you schedule Friday 7/8 to see me 1125 15 mins to review CT?

## 2020-10-11 NOTE — Telephone Encounter (Signed)
Called and given below appt. She is aware of appt time/date.

## 2020-10-13 ENCOUNTER — Other Ambulatory Visit: Payer: Self-pay

## 2020-10-13 ENCOUNTER — Ambulatory Visit (HOSPITAL_COMMUNITY)
Admission: RE | Admit: 2020-10-13 | Discharge: 2020-10-13 | Disposition: A | Discharge status: Home / Self Care | Payer: Medicaid Other | Source: Ambulatory Visit | Attending: Hematology and Oncology | Admitting: Hematology and Oncology

## 2020-10-13 DIAGNOSIS — D3911 Neoplasm of uncertain behavior of right ovary: Secondary | ICD-10-CM | POA: Diagnosis not present

## 2020-10-13 MED ORDER — IOHEXOL 300 MG/ML  SOLN
100.0000 mL | Freq: Once | INTRAMUSCULAR | Status: AC | PRN
  Administered 2020-10-13: 100 mL via INTRAVENOUS

## 2020-10-13 MED ORDER — SODIUM CHLORIDE (PF) 0.9 % IJ SOLN
INTRAMUSCULAR | Status: AC
  Filled 2020-10-13: qty 50

## 2020-10-14 ENCOUNTER — Encounter: Payer: Self-pay | Admitting: Hematology and Oncology

## 2020-10-14 ENCOUNTER — Inpatient Hospital Stay: Payer: Medicaid Other | Attending: Gynecologic Oncology | Admitting: Hematology and Oncology

## 2020-10-14 DIAGNOSIS — Z8543 Personal history of malignant neoplasm of ovary: Secondary | ICD-10-CM | POA: Insufficient documentation

## 2020-10-14 DIAGNOSIS — Z90721 Acquired absence of ovaries, unilateral: Secondary | ICD-10-CM | POA: Diagnosis not present

## 2020-10-14 DIAGNOSIS — Z9221 Personal history of antineoplastic chemotherapy: Secondary | ICD-10-CM | POA: Diagnosis not present

## 2020-10-14 DIAGNOSIS — D3911 Neoplasm of uncertain behavior of right ovary: Secondary | ICD-10-CM | POA: Diagnosis not present

## 2020-10-14 DIAGNOSIS — D539 Nutritional anemia, unspecified: Secondary | ICD-10-CM | POA: Insufficient documentation

## 2020-10-14 NOTE — Assessment & Plan Note (Signed)
Her imaging studies show no evidence of disease She will see Dr. Berline Lopes in 3 months I plan to see her again in 6 months

## 2020-10-14 NOTE — Assessment & Plan Note (Signed)
She had recent anemia We will take her of several more weeks to recover from treatment I wrote her a letter to return back to work early next month

## 2020-10-14 NOTE — Progress Notes (Signed)
[place] OFFICE PROGRESS NOTE  Patient Care Team: Patient, No Pcp Per (Inactive) as PCP - General (General Practice) Awanda Mink, Craige Cotta, RN as Oncology Nurse Navigator (Oncology)  ASSESSMENT & PLAN:  Granulosa cell tumor of ovary, right Her imaging studies show no evidence of disease She will see Dr. Berline Lopes in 3 months I plan to see her again in 6 months  Deficiency anemia She had recent anemia We will take her of several more weeks to recover from treatment I wrote her a letter to return back to work early next month  Orders Placed This Encounter  Procedures   Inhibin B    Standing Status:   Standing    Number of Occurrences:   3    Standing Expiration Date:   10/14/2021    All questions were answered. The patient knows to call the clinic with any problems, questions or concerns. The total time spent in the appointment was 20 minutes encounter with patients including review of chart and various tests results, discussions about plan of care and coordination of care plan   Heath Lark, MD 10/14/2020 2:46 PM  INTERVAL HISTORY: Please see below for problem oriented charting. She returns for further follow-up She have no residual side effects from treatment She has some mild fatigue Denies abdominal pain or changes in bowel habits  SUMMARY OF ONCOLOGIC HISTORY: Oncology History Overview Note  1/14: inhibin B - 3,543 Inhibin A - 496  Presented initially on 04/21/20 with suspected pregnancy, increasing abdominal growth. 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   Granulosa cell tumor of ovary, right  04/21/2020 Imaging   CT A/P: 1. 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 Ovarian lesion is typically a fibroma in this setting. There is a cleavage plane between the adnexal mass and the  uterus, which makes a pedunculated fibroid unlikely. 2. There is a probable nabothian cyst in the cervix. 3. Ascites causes mass effect on the abdominopelvic structures   04/22/2020 Procedure   Paracentesis, 9L  FINAL MICROSCOPIC DIAGNOSIS:  - Atypical cells present  - See comment.    04/23/2020 Procedure   Thoracentesis, 2L  FINAL MICROSCOPIC DIAGNOSIS:  - Reactive mesothelial cells present   04/27/2020 Surgery   Robotic-assisted lysis of adhesions, left salpingoo-oophorectomy, laparotomy for specimen removal, ileocecal resection and reanastomosis, appendectomy, omentectomy  Findings: On EUA, small mobile uterus with mass that moves in junction with the uterus, sitting out of the pelvis and palpable with the abdominal hand. Mass is firm and adherent to the anterior abdominal wall. On intra-abdominal entry, approximately 6L of green-tinged ascites noted and removed. Normal appearing upper abdominal survey. Normal appearing omentum. 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 zsome pockets of degeneration that are unavoidably entered during gentle manipulation of the ovary. Mass with dense adhesions to the anterior abdominal wall on the right, the right sidewall, and several loops of ileum. Uterus 8cm and normal appearing. Right fallopian tube and ovary without evidence of disease. Some inflammatory rind noted on the small and large bowel. Given size of mass and its suspected degeneration and poor integrity, unable to place in an Endocatch bag and midline excision extended for specimen removal. Given concern for thermal damage to the terminal ileum from lyssi of dense adhesions, decision made to proceed with ileocectomy. 1-1.5cm mesenteric node palpated within the mesentery  removed. Some shotty sub centimeter mesenteric nodes palpated. No para-aortic or pelvic lymphadenopathy. Liver edge, diaphragm and stomach smooth. Omentum without evidence of disease. An  additional 3L of ascites was removed over the course of the surgery.   04/27/2020 Pathology Results   A. OVARY AND FALLOPIAN TUBE, LEFT, SALPINGO OOPHORECTOMY:  - Granulosa cell tumor, adult type, multiple fragments measuring 18.5 cm  in aggregate  - No evidence of ovarian surface involvement  - Benign unremarkable fallopian tube  - See oncology table   B. ILEOCECUM AND APPENDIX, RESECTION:  - Segment of terminal ileum (9 cm) and colon (10 cm) showing focal  serosal disruption  - Unremarkable appendix  - Margins appear viable  - Benign lymph nodes  - No evidence of malignancy   C. OMENTUM, RESECTION:  - Portion of omentum with congestion and mild mesothelial hyperplasia  - No evidence of malignancy   OVARY or FALLOPIAN TUBE or PRIMARY PERITONEUM: Resection   Procedure: Salpingo-oophorectomy  Specimen Integrity: Fragmented  Tumor Site: Left ovary  Tumor Size: Multiple tissue fragments measured 18.5 cm in aggregate  Histologic Type: Granulosa cell tumor, adult type  Histologic Grade: Not applicable  Ovarian Surface Involvement: Not identified  Fallopian Tube Surface Involvement: Not identified  Implants (required for advanced stage serous/seromucinous borderline  tumors only): Not applicable  Other Tissue/ Organ Involvement: Not applicable  Largest Extrapelvic Peritoneal Focus: Not identified  Peritoneal/Ascitic Fluid Involvement: Not identified ([id number])  Chemotherapy Response Score (CRS): Not applicable, no known presurgical  therapy  Regional Lymph Nodes: Not applicable (no lymph nodes submitted or found)  Distant Metastasis:       Distant Site(s) Involved: Not applicable  Pathologic Stage Classification (pTNM, AJCC 8th Edition): pT1c, pN not  assigned (no nodes submitted or found).  See comment  Ancillary Studies: Can be performed upon request  Representative Tumor Block: A2  Comment(s): The tumor stage is due to intraoperative surgical spill of  the tumor cells based  on discussion with the surgeon Dr. Jeral Pinch.   04/27/2020 Cancer Staging   Staging form: Ovary, Fallopian Tube, and Primary Peritoneal Carcinoma, AJCC 8th Edition - Clinical stage from 04/27/2020: FIGO Stage IC1, calculated as Stage IC (cT1c1, cN0, cM0) - Signed by Lafonda Mosses, MD on 05/05/2020    05/05/2020 Initial Diagnosis   Granulosa cell tumor of ovary, right   05/30/2020 - 10/12/2020 Chemotherapy    Patient is on Treatment Plan: OVARIAN CARBOPLATIN (AUC 6) / PACLITAXEL (175) Q21D X 6 CYCLES       06/21/2020 Tumor Marker   Patient's tumor was tested for the following markers: Inhibin B Results of the tumor marker test revealed 17.5   10/13/2020 Imaging   Negative. No evidence of recurrent or metastatic carcinoma within the abdomen or pelvis.     REVIEW OF SYSTEMS:   Constitutional: Denies fevers, chills or abnormal weight loss Eyes: Denies blurriness of vision Ears, nose, mouth, throat, and face: Denies mucositis or sore throat Respiratory: Denies cough, dyspnea or wheezes Cardiovascular: Denies palpitation, chest discomfort or lower extremity swelling Gastrointestinal:  Denies nausea, heartburn or change in bowel habits Skin: Denies abnormal skin rashes Lymphatics: Denies new lymphadenopathy or easy bruising Neurological:Denies numbness, tingling or new weaknesses Behavioral/Psych: Mood is stable, no new changes  All other systems were reviewed with the patient and are negative.  I have reviewed the past medical history, past surgical history, social history and family history with the patient and they are unchanged from previous note.  ALLERGIES:  has No Known Allergies.  MEDICATIONS:  No current outpatient medications on file.   No current facility-administered medications for this visit.    PHYSICAL EXAMINATION: ECOG PERFORMANCE STATUS: 0 - Asymptomatic  Vitals:   10/14/20 1200  BP: 112/62  Pulse: 76  Resp: 16  Temp: 98.5 F (36.9 C)  SpO2:  100%   Filed Weights   10/14/20 1200  Weight: 149 lb 3.2 oz (67.7 kg)    GENERAL:alert, no distress and comfortable SKIN: skin color, texture, turgor are normal, no rashes or significant lesions EYES: normal, Conjunctiva are pink and non-injected, sclera clear OROPHARYNX:no exudate, no erythema and lips, buccal mucosa, and tongue normal  NECK: supple, thyroid normal size, non-tender, without nodularity LYMPH:  no palpable lymphadenopathy in the cervical, axillary or inguinal LUNGS: clear to auscultation and percussion with normal breathing effort HEART: regular rate & rhythm and no murmurs and no lower extremity edema ABDOMEN:abdomen soft, non-tender and normal bowel sounds Musculoskeletal:no cyanosis of digits and no clubbing  NEURO: alert & oriented x 3 with fluent speech, no focal motor/sensory deficits  LABORATORY DATA:  I have reviewed the data as listed    Component Value Date/Time   NA 138 09/12/2020 0825   K 4.2 09/12/2020 0825   CL 108 09/12/2020 0825   CO2 20 (L) 09/12/2020 0825   GLUCOSE 172 (H) 09/12/2020 0825   BUN 13 09/12/2020 0825   CREATININE 0.84 09/12/2020 0825   CALCIUM 9.0 09/12/2020 0825   PROT 7.9 09/12/2020 0825   ALBUMIN 3.6 09/12/2020 0825   AST 11 (L) 09/12/2020 0825   ALT 14 09/12/2020 0825   ALKPHOS 66 09/12/2020 0825   BILITOT 0.3 09/12/2020 0825   GFRNONAA >60 09/12/2020 0825    No results found for: SPEP, UPEP  Lab Results  Component Value Date   WBC 4.8 09/12/2020   NEUTROABS 4.2 09/12/2020   HGB 10.7 (L) 09/12/2020   HCT 32.3 (L) 09/12/2020   MCV 84.8 09/12/2020   PLT 144 (L) 09/12/2020      Chemistry      Component Value Date/Time   NA 138 09/12/2020 0825   K 4.2 09/12/2020 0825   CL 108 09/12/2020 0825   CO2 20 (L) 09/12/2020 0825   BUN 13 09/12/2020 0825   CREATININE 0.84 09/12/2020 0825      Component Value Date/Time   CALCIUM 9.0 09/12/2020 0825   ALKPHOS 66 09/12/2020 0825   AST 11 (L) 09/12/2020 0825   ALT 14  09/12/2020 0825   BILITOT 0.3 09/12/2020 0825       RADIOGRAPHIC STUDIES: I have personally reviewed the radiological images as listed and agreed with the findings in the report. CT ABDOMEN PELVIS W CONTRAST  Result Date: 10/13/2020 CLINICAL DATA:  Follow-up ovarian carcinoma. Recently completed chemotherapy. Previous right salpingo oophorectomy. EXAM: CT ABDOMEN AND PELVIS WITH CONTRAST TECHNIQUE: Multidetector CT imaging of the abdomen and pelvis was performed using the standard protocol following bolus administration of intravenous contrast. CONTRAST:  115mL OMNIPAQUE IOHEXOL 300 MG/ML  SOLN COMPARISON:  04/21/2020 FINDINGS: Lower Chest: No acute findings. Hepatobiliary: No hepatic masses identified. Gallbladder is unremarkable. No evidence of biliary ductal dilatation. Pancreas:  No mass or inflammatory changes. Spleen: Within normal limits in size and appearance. Adrenals/Urinary Tract: No masses identified. No evidence of ureteral calculi or hydronephrosis. Stomach/Bowel: No evidence of obstruction, inflammatory process or abnormal fluid collections. Vascular/Lymphatic: No pathologically enlarged lymph nodes. No acute vascular findings. Reproductive: Normal appearance of uterus. No evidence of residual or  recurrent pelvic mass. No evidence of peritoneal nodules, thickening, or ascites. Other:  None. Musculoskeletal:  No suspicious bone lesions identified. IMPRESSION: Negative. No evidence of recurrent or metastatic carcinoma within the abdomen or pelvis. Electronically Signed   By: Marlaine Hind M.D.   On: 10/13/2020 13:30

## 2020-12-26 ENCOUNTER — Telehealth: Payer: Self-pay | Admitting: *Deleted

## 2020-12-26 NOTE — Telephone Encounter (Signed)
Called and moved appt from 10/11 to 10/10

## 2021-01-04 ENCOUNTER — Other Ambulatory Visit: Payer: Self-pay | Admitting: Adult Health

## 2021-01-04 DIAGNOSIS — D3911 Neoplasm of uncertain behavior of right ovary: Secondary | ICD-10-CM

## 2021-01-15 NOTE — Progress Notes (Signed)
Gynecologic Oncology Return Clinic Visit  01/16/21  Reason for Visit: follow-up after completion of adjuvant therapy, surveillance  Treatment History: Oncology History Overview Note  1/14: inhibin B - 3,543 Inhibin A - 496  Presented initially on 04/21/20 with suspected pregnancy, increasing abdominal growth. 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   Granulosa cell tumor of ovary, right  04/21/2020 Imaging   CT A/P: 1. 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 Ovarian lesion is typically a fibroma in this setting. There is a cleavage plane between the adnexal mass and the uterus, which makes a pedunculated fibroid unlikely. 2. There is a probable nabothian cyst in the cervix. 3. Ascites causes mass effect on the abdominopelvic structures   04/22/2020 Procedure   Paracentesis, 9L  FINAL MICROSCOPIC DIAGNOSIS:  - Atypical cells present  - See comment.    04/23/2020 Procedure   Thoracentesis, 2L  FINAL MICROSCOPIC DIAGNOSIS:  - Reactive mesothelial cells present   04/27/2020 Surgery   Robotic-assisted lysis of adhesions, left salpingoo-oophorectomy, laparotomy for specimen removal, ileocecal resection and reanastomosis, appendectomy, omentectomy  Findings: On EUA, small mobile uterus with mass that moves in junction with the uterus, sitting out of the pelvis and palpable with the abdominal hand. Mass is firm and adherent to the anterior abdominal wall. On intra-abdominal entry, approximately 6L of green-tinged ascites noted and removed. Normal appearing upper abdominal survey. Normal appearing omentum. 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 zsome pockets of degeneration that are unavoidably entered during gentle manipulation of the ovary. Mass with dense  adhesions to the anterior abdominal wall on the right, the right sidewall, and several loops of ileum. Uterus 8cm and normal appearing. Right fallopian tube and ovary without evidence of disease. Some inflammatory rind noted on the small and large bowel. Given size of mass and its suspected degeneration and poor integrity, unable to place in an Endocatch bag and midline excision extended for specimen removal. Given concern for thermal damage to the terminal ileum from lyssi of dense adhesions, decision made to proceed with ileocectomy. 1-1.5cm mesenteric node palpated within the mesentery removed. Some shotty sub centimeter mesenteric nodes palpated. No para-aortic or pelvic lymphadenopathy. Liver edge, diaphragm and stomach smooth. Omentum without evidence of disease. An additional 3L of ascites was removed over the course of the surgery.   04/27/2020 Pathology Results   A. OVARY AND FALLOPIAN TUBE, LEFT, SALPINGO OOPHORECTOMY:  - Granulosa cell tumor, adult type, multiple fragments measuring 18.5 cm  in aggregate  - No evidence of ovarian surface involvement  - Benign unremarkable fallopian tube  - See oncology table   B. ILEOCECUM AND APPENDIX, RESECTION:  - Segment of terminal ileum (9 cm) and colon (10 cm) showing focal  serosal disruption  - Unremarkable appendix  - Margins appear viable  - Benign lymph nodes  - No evidence of malignancy   C. OMENTUM, RESECTION:  - Portion of omentum with congestion and mild mesothelial hyperplasia  - No evidence of malignancy   OVARY or FALLOPIAN TUBE or PRIMARY PERITONEUM: Resection   Procedure: Salpingo-oophorectomy  Specimen Integrity: Fragmented  Tumor Site: Left ovary  Tumor Size: Multiple tissue fragments measured 18.5 cm in aggregate  Histologic Type: Granulosa cell tumor, adult type  Histologic Grade: Not applicable  Ovarian Surface Involvement: Not identified  Fallopian Tube Surface Involvement: Not identified  Implants (required  for  advanced stage serous/seromucinous borderline  tumors only): Not applicable  Other Tissue/ Organ Involvement: Not applicable  Largest Extrapelvic Peritoneal Focus: Not identified  Peritoneal/Ascitic Fluid Involvement: Not identified ([id number])  Chemotherapy Response Score (CRS): Not applicable, no known presurgical  therapy  Regional Lymph Nodes: Not applicable (no lymph nodes submitted or found)  Distant Metastasis:       Distant Site(s) Involved: Not applicable  Pathologic Stage Classification (pTNM, AJCC 8th Edition): pT1c, pN not  assigned (no nodes submitted or found).  See comment  Ancillary Studies: Can be performed upon request  Representative Tumor Block: A2  Comment(s): The tumor stage is due to intraoperative surgical spill of  the tumor cells based on discussion with the surgeon Dr. Jeral Pinch.   04/27/2020 Cancer Staging   Staging form: Ovary, Fallopian Tube, and Primary Peritoneal Carcinoma, AJCC 8th Edition - Clinical stage from 04/27/2020: FIGO Stage IC1, calculated as Stage IC (cT1c1, cN0, cM0) - Signed by Lafonda Mosses, MD on 05/05/2020   05/05/2020 Initial Diagnosis   Granulosa cell tumor of ovary, right   05/30/2020 - 10/12/2020 Chemotherapy    Patient is on Treatment Plan: OVARIAN CARBOPLATIN (AUC 6) / PACLITAXEL (175) Q21D X 6 CYCLES       06/21/2020 Tumor Marker   Patient's tumor was tested for the following markers: Inhibin B Results of the tumor marker test revealed 17.5   10/13/2020 Imaging   Negative. No evidence of recurrent or metastatic carcinoma within the abdomen or pelvis.     Interval History: Finished chemotherapy on 6/6. Saw Dr. Alvy Bimler on 7/8.  Inhibin B was a marker for her, elevated at 3543 at presentation. Imaging in 10/2020 was negative for recurrence.  Patient presents today for her first surveillance visit with me.  She notes overall doing very well.  She developed some neuropathy with chemotherapy, this has resolved since  finishing treatment.  Her hair is starting to grow back.  She endorses a good appetite without nausea or emesis.  She reports regular bowel and bladder function.  She denies any abdominal or pelvic pain.  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   Ascites    Medical history non-contributory    Pelvic mass     Past Surgical History:  Procedure Laterality Date   IR PARACENTESIS  04/22/2020   LAPAROTOMY N/A 04/27/2020   Procedure: MINI LAPAROTOMY, ILEOCECAL RESECTION, APPENDECTOMY;  Surgeon: Lafonda Mosses, MD;  Location: [facility];  Service: Gynecology;  Laterality: N/A;   ROBOTIC ASSISTED SALPINGO OOPHERECTOMY Right 04/27/2020   Procedure: XI ROBOTIC ASSISTED UNITLATERAL SALPINGO OOPHORECTOMY;  Surgeon: Lafonda Mosses, MD;  Location: [facility];  Service: Gynecology;  Laterality: Right;   THORACENTESIS     WISDOM TOOTH EXTRACTION      Family History  Problem Relation Age of Onset   Diabetes Mother    Diabetes Father    Seizures Sister    Myasthenia gravis Sister    ADD / ADHD Sister    Ovarian cancer Neg Hx    Uterine cancer Neg Hx    Breast cancer Neg Hx    Colon cancer Neg Hx     Social History   Socioeconomic History   Marital status: Single    Spouse name: Not on file   Number of children: 1   Years of education: Not on file   Highest education level: Not on file  Occupational History    Employer: [employer]  Tobacco Use   Smoking status: Never   Smokeless tobacco: Never  Vaping Use   Vaping Use: Never used  Substance and Sexual Activity   Alcohol use: No   Drug use: No   Sexual activity: Yes    Birth control/protection: None  Other Topics Concern   Not on file  Social History Narrative   Lives with boy friend Glass blower/designer)   Social Determinants of Health   Financial Resource Strain: Not on file  Food Insecurity: Not on file  Transportation Needs: Not on file  Physical Activity: Not on file   Stress: Not on file  Social Connections: Not on file    Current Medications: No current outpatient medications on file.  Review of Systems: Denies appetite changes, fevers, chills, fatigue, unexplained weight changes. Denies hearing loss, neck lumps or masses, mouth sores, ringing in ears or voice changes. Denies cough or wheezing.  Denies shortness of breath. Denies chest pain or palpitations. Denies leg swelling. Denies abdominal distention, pain, blood in stools, constipation, diarrhea, nausea, vomiting, or early satiety. Denies pain with intercourse, dysuria, frequency, hematuria or incontinence. Denies hot flashes, pelvic pain, vaginal bleeding or vaginal discharge.   Denies joint pain, back pain or muscle pain/cramps. Denies itching, rash, or wounds. Denies dizziness, headaches, numbness or seizures. Denies swollen lymph nodes or glands, denies easy bruising or bleeding. Denies anxiety, depression, confusion, or decreased concentration.  Physical Exam: BP 119/80 (BP Location: Left Arm, Patient Position: Sitting)   Pulse 72   Temp 98.8 F (37.1 C) (Oral)   Resp 16   Ht 5\' 2"  (1.575 m)   Wt 174 lb (78.9 kg)   SpO2 100%   BMI 31.83 kg/m  General: Alert, oriented, no acute distress. HEENT: Normocephalic, atraumatic, sclera anicteric. Chest: Clear to auscultation bilaterally.  No wheezes or rhonchi. Cardiovascular: Regular rate and rhythm, no murmurs. Abdomen: soft, nontender.  Normoactive bowel sounds.  No masses or hepatosplenomegaly appreciated.  Well-healed incisions. Extremities: Grossly normal range of motion.  Warm, well perfused.  No edema bilaterally. Skin: No rashes or lesions noted. Lymphatics: No cervical, supraclavicular, or inguinal adenopathy. GU: Normal appearing external genitalia without erythema, excoriation, or lesions.  Speculum exam reveals well rugated vaginal mucosa, normal-appearing cervix, no lesions or masses.  Bimanual exam reveals small mobile  uterus, no adnexal masses.  No nodularity.  Laboratory & Radiologic Studies: None new  Assessment & Plan: GERIANNE SIMONET is a 30 y.o. 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  Patient is overall doing very well and is NED on exam today.  We will plan to get in inhibin B today as this was a tumor marker for her prior to surgery.  She is scheduled for follow-up in 3 months with Dr. Alvy Bimler and will come back to see me in 6.  We will get imaging if elevation of her tumor marker or symptoms suggest possible recurrence.  She and I discussed signs and symptoms that would be concerning for recurrence today including abnormal uterine bleeding.  She knows to call the clinic if she develops any of these before her neck scheduled visit.  36 minutes of total time was spent for this patient encounter, including preparation, face-to-face counseling with the patient and coordination of care, and documentation of the encounter.  Jeral Pinch, MD  Division of Gynecologic Oncology  Department of Obstetrics and Gynecology  [facility]

## 2021-01-16 ENCOUNTER — Encounter: Payer: Self-pay | Admitting: Gynecologic Oncology

## 2021-01-16 ENCOUNTER — Inpatient Hospital Stay: Payer: Medicaid Other

## 2021-01-16 ENCOUNTER — Inpatient Hospital Stay: Payer: Medicaid Other | Attending: Gynecologic Oncology | Admitting: Gynecologic Oncology

## 2021-01-16 ENCOUNTER — Other Ambulatory Visit: Payer: Self-pay

## 2021-01-16 VITALS — BP 119/80 | HR 72 | Temp 98.8°F | Resp 16 | Ht 62.0 in | Wt 174.0 lb

## 2021-01-16 DIAGNOSIS — D3911 Neoplasm of uncertain behavior of right ovary: Secondary | ICD-10-CM

## 2021-01-16 DIAGNOSIS — Z8543 Personal history of malignant neoplasm of ovary: Secondary | ICD-10-CM | POA: Insufficient documentation

## 2021-01-16 DIAGNOSIS — Z90721 Acquired absence of ovaries, unilateral: Secondary | ICD-10-CM | POA: Insufficient documentation

## 2021-01-16 DIAGNOSIS — Z9221 Personal history of antineoplastic chemotherapy: Secondary | ICD-10-CM | POA: Diagnosis not present

## 2021-01-16 LAB — CMP (CANCER CENTER ONLY)
ALT: 12 U/L (ref 0–44)
AST: 13 U/L — ABNORMAL LOW (ref 15–41)
Albumin: 3.8 g/dL (ref 3.5–5.0)
Alkaline Phosphatase: 68 U/L (ref 38–126)
Anion gap: 11 (ref 5–15)
BUN: 8 mg/dL (ref 6–20)
CO2: 26 mmol/L (ref 22–32)
Calcium: 9.8 mg/dL (ref 8.9–10.3)
Chloride: 102 mmol/L (ref 98–111)
Creatinine: 0.99 mg/dL (ref 0.44–1.00)
GFR, Estimated: >60 mL/min (ref >60)
Glucose, Bld: 97 mg/dL (ref 70–99)
Potassium: 3.6 mmol/L (ref 3.5–5.1)
Sodium: 139 mmol/L (ref 135–145)
Total Bilirubin: 0.3 mg/dL (ref 0.3–1.2)
Total Protein: 8.2 g/dL — ABNORMAL HIGH (ref 6.5–8.1)

## 2021-01-16 LAB — CBC WITH DIFFERENTIAL (CANCER CENTER ONLY)
Abs Immature Granulocytes: 0.01 10*3/uL (ref 0.00–0.07)
Basophils Absolute: 0 10*3/uL (ref 0.0–0.1)
Basophils Relative: 1 %
Eosinophils Absolute: 0.1 10*3/uL (ref 0.0–0.5)
Eosinophils Relative: 2 %
HCT: 40 % (ref 36.0–46.0)
Hemoglobin: 12.8 g/dL (ref 12.0–15.0)
Immature Granulocytes: 0 %
Lymphocytes Relative: 39 %
Lymphs Abs: 2.3 10*3/uL (ref 0.7–4.0)
MCH: 26.4 pg (ref 26.0–34.0)
MCHC: 32 g/dL (ref 30.0–36.0)
MCV: 82.5 fL (ref 80.0–100.0)
Monocytes Absolute: 0.4 10*3/uL (ref 0.1–1.0)
Monocytes Relative: 7 %
Neutro Abs: 3 10*3/uL (ref 1.7–7.7)
Neutrophils Relative %: 51 %
Platelet Count: 197 10*3/uL (ref 150–400)
RBC: 4.85 MIL/uL (ref 3.87–5.11)
RDW: 12.4 % (ref 11.5–15.5)
WBC Count: 5.8 10*3/uL (ref 4.0–10.5)
nRBC: 0 % (ref 0.0–0.2)

## 2021-01-16 NOTE — Patient Instructions (Addendum)
It was great to see you!  Glad you did so well with treatment.  I do not see or feel anything concerning for tumor recurrence on your exam today.  We will let you know when your blood work comes back from today, likely next week.  You have an appointment with Dr. Alvy Bimler in early January.  Please call my office sometime after the first of the year to get a visit scheduled with me for early April.  If anything changes or you develop symptoms like we talked about today (change to your bleeding, pelvic pain, change to bowel function), please call to see me sooner.

## 2021-01-17 ENCOUNTER — Ambulatory Visit: Payer: Medicaid Other | Admitting: Gynecologic Oncology

## 2021-01-31 ENCOUNTER — Other Ambulatory Visit: Payer: Self-pay

## 2021-01-31 ENCOUNTER — Encounter: Payer: Self-pay | Admitting: *Deleted

## 2021-01-31 ENCOUNTER — Inpatient Hospital Stay (HOSPITAL_BASED_OUTPATIENT_CLINIC_OR_DEPARTMENT_OTHER): Payer: Medicaid Other | Admitting: *Deleted

## 2021-01-31 ENCOUNTER — Encounter: Payer: Self-pay | Admitting: Gynecologic Oncology

## 2021-01-31 VITALS — BP 113/59 | HR 66 | Temp 98.7°F | Resp 18 | Ht 62.0 in | Wt 179.3 lb

## 2021-01-31 DIAGNOSIS — D3912 Neoplasm of uncertain behavior of left ovary: Secondary | ICD-10-CM

## 2021-01-31 NOTE — Progress Notes (Signed)
  Reviewed and completed SCP visit with patient as well as reviewed the care plan summary.SDOH assessment was completed. Pt does not have a PCP,therefore I will make a referral for her to [place] for primary care. Pt had questions and all were addressed answered concerning her surgery  noted to the left ovary. No other concerns when pt left.

## 2021-04-14 ENCOUNTER — Other Ambulatory Visit: Payer: Self-pay

## 2021-04-14 ENCOUNTER — Encounter: Payer: Self-pay | Admitting: Hematology and Oncology

## 2021-04-14 ENCOUNTER — Inpatient Hospital Stay (HOSPITAL_BASED_OUTPATIENT_CLINIC_OR_DEPARTMENT_OTHER): Payer: Medicaid Other | Admitting: Hematology and Oncology

## 2021-04-14 ENCOUNTER — Inpatient Hospital Stay: Payer: Medicaid Other | Attending: Gynecologic Oncology

## 2021-04-14 DIAGNOSIS — Z90721 Acquired absence of ovaries, unilateral: Secondary | ICD-10-CM | POA: Insufficient documentation

## 2021-04-14 DIAGNOSIS — Z8543 Personal history of malignant neoplasm of ovary: Secondary | ICD-10-CM | POA: Diagnosis present

## 2021-04-14 DIAGNOSIS — C562 Malignant neoplasm of left ovary: Secondary | ICD-10-CM

## 2021-04-14 DIAGNOSIS — R635 Abnormal weight gain: Secondary | ICD-10-CM | POA: Diagnosis not present

## 2021-04-14 DIAGNOSIS — Z9221 Personal history of antineoplastic chemotherapy: Secondary | ICD-10-CM | POA: Insufficient documentation

## 2021-04-14 DIAGNOSIS — D3911 Neoplasm of uncertain behavior of right ovary: Secondary | ICD-10-CM

## 2021-04-14 HISTORY — DX: Abnormal weight gain: R63.5

## 2021-04-14 LAB — CBC WITH DIFFERENTIAL (CANCER CENTER ONLY)
Abs Immature Granulocytes: 0.01 10*3/uL (ref 0.00–0.07)
Basophils Absolute: 0 10*3/uL (ref 0.0–0.1)
Basophils Relative: 1 %
Eosinophils Absolute: 0.1 10*3/uL (ref 0.0–0.5)
Eosinophils Relative: 2 %
HCT: 37.1 % (ref 36.0–46.0)
Hemoglobin: 12 g/dL (ref 12.0–15.0)
Immature Granulocytes: 0 %
Lymphocytes Relative: 47 %
Lymphs Abs: 2.4 10*3/uL (ref 0.7–4.0)
MCH: 26.4 pg (ref 26.0–34.0)
MCHC: 32.3 g/dL (ref 30.0–36.0)
MCV: 81.7 fL (ref 80.0–100.0)
Monocytes Absolute: 0.3 10*3/uL (ref 0.1–1.0)
Monocytes Relative: 5 %
Neutro Abs: 2.3 10*3/uL (ref 1.7–7.7)
Neutrophils Relative %: 45 %
Platelet Count: 160 10*3/uL (ref 150–400)
RBC: 4.54 MIL/uL (ref 3.87–5.11)
RDW: 13 % (ref 11.5–15.5)
Smear Review: NORMAL
WBC Count: 5.1 10*3/uL (ref 4.0–10.5)
nRBC: 0 % (ref 0.0–0.2)

## 2021-04-14 LAB — CMP (CANCER CENTER ONLY)
ALT: 9 U/L (ref 0–44)
AST: 10 U/L — ABNORMAL LOW (ref 15–41)
Albumin: 3.6 g/dL (ref 3.5–5.0)
Alkaline Phosphatase: 61 U/L (ref 38–126)
Anion gap: 6 (ref 5–15)
BUN: 10 mg/dL (ref 6–20)
CO2: 25 mmol/L (ref 22–32)
Calcium: 8.6 mg/dL — ABNORMAL LOW (ref 8.9–10.3)
Chloride: 105 mmol/L (ref 98–111)
Creatinine: 0.96 mg/dL (ref 0.44–1.00)
GFR, Estimated: >60 mL/min (ref >60)
Glucose, Bld: 93 mg/dL (ref 70–99)
Potassium: 3.7 mmol/L (ref 3.5–5.1)
Sodium: 136 mmol/L (ref 135–145)
Total Bilirubin: 0.4 mg/dL (ref 0.3–1.2)
Total Protein: 7.2 g/dL (ref 6.5–8.1)

## 2021-04-14 NOTE — Assessment & Plan Note (Signed)
Her last imaging studies show no evidence of disease She has no clinical signs or symptoms to suggest cancer recurrence She will see Dr. Berline Lopes in 3 months I plan to see her again in 6 months

## 2021-04-14 NOTE — Assessment & Plan Note (Signed)
She has tremendous weight gain since completion of treatment We discussed importance of dietary modification and exercise 

## 2021-04-14 NOTE — Progress Notes (Signed)
[place] OFFICE PROGRESS NOTE  Patient Care Team: Pcp, No as PCP - General Awanda Mink Craige Cotta, RN as Oncology Nurse Navigator (Oncology) Lafonda Mosses, MD as Consulting Physician (Gynecologic Oncology) Dorothyann Gibbs, NP as Nurse Practitioner (Gynecologic Oncology) Heath Lark, MD as Consulting Physician (Hematology and Oncology) Harmon Pier, RN as Registered Nurse  ASSESSMENT & PLAN:  Malignant granulosa cell tumor of ovary, left [facility]) Her last imaging studies show no evidence of disease She has no clinical signs or symptoms to suggest cancer recurrence She will see Dr. Berline Lopes in 3 months I plan to see her again in 6 months  Weight gain She has tremendous weight gain since completion of treatment We discussed importance of dietary modification and exercise  No orders of the defined types were placed in this encounter.   All questions were answered. The patient knows to call the clinic with any problems, questions or concerns. The total time spent in the appointment was 20 minutes encounter with patients including review of chart and various tests results, discussions about plan of care and coordination of care plan   Heath Lark, MD 04/14/2021 5:01 PM  INTERVAL HISTORY: Please see below for problem oriented charting. she returns for surveillance follow-up She is doing well Denies abdominal pain, bloating or changes in bowel habits She has gained a lot of weight since last time I saw her  REVIEW OF SYSTEMS:   Constitutional: Denies fevers, chills or abnormal weight loss Eyes: Denies blurriness of vision Ears, nose, mouth, throat, and face: Denies mucositis or sore throat Respiratory: Denies cough, dyspnea or wheezes Cardiovascular: Denies palpitation, chest discomfort or lower extremity swelling Gastrointestinal:  Denies nausea, heartburn or change in bowel habits Skin: Denies abnormal skin rashes Lymphatics: Denies new lymphadenopathy or easy  bruising Neurological:Denies numbness, tingling or new weaknesses Behavioral/Psych: Mood is stable, no new changes  All other systems were reviewed with the patient and are negative.  I have reviewed the past medical history, past surgical history, social history and family history with the patient and they are unchanged from previous note.  ALLERGIES:  has No Known Allergies.  MEDICATIONS:  No current outpatient medications on file.   No current facility-administered medications for this visit.    SUMMARY OF ONCOLOGIC HISTORY: Oncology History Overview Note  1/14: inhibin B - 3,543 Inhibin A - 50  Presented initially on 04/21/20 with suspected pregnancy, increasing abdominal growth. 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   Malignant granulosa cell tumor of ovary, left (HCC)  04/21/2020 Imaging   CT A/P: 1. 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 Ovarian lesion is typically a fibroma in this setting. There is a cleavage plane between the adnexal mass and the uterus, which makes a pedunculated fibroid unlikely. 2. There is a probable nabothian cyst in the cervix. 3. Ascites causes mass effect on the abdominopelvic structures   04/22/2020 Procedure   Paracentesis, 9L  FINAL MICROSCOPIC DIAGNOSIS:  - Atypical cells present  - See comment.    04/23/2020 Procedure   Thoracentesis, 2L  FINAL MICROSCOPIC DIAGNOSIS:  - Reactive mesothelial cells present   04/27/2020 Surgery   Robotic-assisted lysis of adhesions, left salpingoo-oophorectomy, laparotomy for specimen removal, ileocecal resection and reanastomosis, appendectomy, omentectomy  Findings: On EUA, small mobile uterus with mass that moves in junction with the uterus, sitting out of the pelvis  and palpable with the abdominal hand. Mass is firm  and adherent to the anterior abdominal wall. On intra-abdominal entry, approximately 6L of green-tinged ascites noted and removed. Normal appearing upper abdominal survey. Normal appearing omentum. 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 zsome pockets of degeneration that are unavoidably entered during gentle manipulation of the ovary. Mass with dense adhesions to the anterior abdominal wall on the right, the right sidewall, and several loops of ileum. Uterus 8cm and normal appearing. Right fallopian tube and ovary without evidence of disease. Some inflammatory rind noted on the small and large bowel. Given size of mass and its suspected degeneration and poor integrity, unable to place in an Endocatch bag and midline excision extended for specimen removal. Given concern for thermal damage to the terminal ileum from lyssi of dense adhesions, decision made to proceed with ileocectomy. 1-1.5cm mesenteric node palpated within the mesentery removed. Some shotty sub centimeter mesenteric nodes palpated. No para-aortic or pelvic lymphadenopathy. Liver edge, diaphragm and stomach smooth. Omentum without evidence of disease. An additional 3L of ascites was removed over the course of the surgery.   04/27/2020 Pathology Results   A. OVARY AND FALLOPIAN TUBE, LEFT, SALPINGO OOPHORECTOMY:  - Granulosa cell tumor, adult type, multiple fragments measuring 18.5 cm  in aggregate  - No evidence of ovarian surface involvement  - Benign unremarkable fallopian tube  - See oncology table   B. ILEOCECUM AND APPENDIX, RESECTION:  - Segment of terminal ileum (9 cm) and colon (10 cm) showing focal  serosal disruption  - Unremarkable appendix  - Margins appear viable  - Benign lymph nodes  - No evidence of malignancy   C. OMENTUM, RESECTION:  - Portion of omentum with congestion and mild mesothelial hyperplasia  - No evidence of malignancy   OVARY or FALLOPIAN TUBE or PRIMARY  PERITONEUM: Resection   Procedure: Salpingo-oophorectomy  Specimen Integrity: Fragmented  Tumor Site: Left ovary  Tumor Size: Multiple tissue fragments measured 18.5 cm in aggregate  Histologic Type: Granulosa cell tumor, adult type  Histologic Grade: Not applicable  Ovarian Surface Involvement: Not identified  Fallopian Tube Surface Involvement: Not identified  Implants (required for advanced stage serous/seromucinous borderline  tumors only): Not applicable  Other Tissue/ Organ Involvement: Not applicable  Largest Extrapelvic Peritoneal Focus: Not identified  Peritoneal/Ascitic Fluid Involvement: Not identified ([id number])  Chemotherapy Response Score (CRS): Not applicable, no known presurgical  therapy  Regional Lymph Nodes: Not applicable (no lymph nodes submitted or found)  Distant Metastasis:       Distant Site(s) Involved: Not applicable  Pathologic Stage Classification (pTNM, AJCC 8th Edition): pT1c, pN not  assigned (no nodes submitted or found).  See comment  Ancillary Studies: Can be performed upon request  Representative Tumor Block: A2  Comment(s): The tumor stage is due to intraoperative surgical spill of  the tumor cells based on discussion with the surgeon Dr. Jeral Pinch.   04/27/2020 Cancer Staging   Staging form: Ovary, Fallopian Tube, and Primary Peritoneal Carcinoma, AJCC 8th Edition - Clinical stage from 04/27/2020: FIGO Stage IC1, calculated as Stage IC (cT1c1, cN0, cM0) - Signed by Lafonda Mosses, MD on 05/05/2020    05/05/2020 Initial Diagnosis   Granulosa cell tumor of ovary, right   05/30/2020 - 10/12/2020 Chemotherapy    Patient is on Treatment Plan: OVARIAN CARBOPLATIN (AUC 6) / PACLITAXEL (175) Q21D X 6 CYCLES       06/21/2020 Tumor Marker   Patient's tumor was tested  for the following markers: Inhibin B Results of the tumor marker test revealed 17.5   10/13/2020 Imaging   Negative. No evidence of recurrent or metastatic carcinoma within  the abdomen or pelvis.     PHYSICAL EXAMINATION: ECOG PERFORMANCE STATUS: 0 - Asymptomatic  Vitals:   04/14/21 1119  BP: 123/77  Pulse: 75  Resp: 18  Temp: 97.9 F (36.6 C)  SpO2: 100%   Filed Weights   04/14/21 1119  Weight: 192 lb (87.1 kg)    GENERAL:alert, no distress and comfortable SKIN: skin color, texture, turgor are normal, no rashes or significant lesions EYES: normal, Conjunctiva are pink and non-injected, sclera clear OROPHARYNX:no exudate, no erythema and lips, buccal mucosa, and tongue normal  NECK: supple, thyroid normal size, non-tender, without nodularity LYMPH:  no palpable lymphadenopathy in the cervical, axillary or inguinal LUNGS: clear to auscultation and percussion with normal breathing effort HEART: regular rate & rhythm and no murmurs and no lower extremity edema ABDOMEN:abdomen soft, non-tender and normal bowel sounds Musculoskeletal:no cyanosis of digits and no clubbing  NEURO: alert & oriented x 3 with fluent speech, no focal motor/sensory deficits  LABORATORY DATA:  I have reviewed the data as listed    Component Value Date/Time   NA 136 04/14/2021 1101   K 3.7 04/14/2021 1101   CL 105 04/14/2021 1101   CO2 25 04/14/2021 1101   GLUCOSE 93 04/14/2021 1101   BUN 10 04/14/2021 1101   CREATININE 0.96 04/14/2021 1101   CALCIUM 8.6 (L) 04/14/2021 1101   PROT 7.2 04/14/2021 1101   ALBUMIN 3.6 04/14/2021 1101   AST 10 (L) 04/14/2021 1101   ALT 9 04/14/2021 1101   ALKPHOS 61 04/14/2021 1101   BILITOT 0.4 04/14/2021 1101   GFRNONAA >60 04/14/2021 1101    No results found for: SPEP, UPEP  Lab Results  Component Value Date   WBC 5.1 04/14/2021   NEUTROABS 2.3 04/14/2021   HGB 12.0 04/14/2021   HCT 37.1 04/14/2021   MCV 81.7 04/14/2021   PLT 160 04/14/2021      Chemistry      Component Value Date/Time   NA 136 04/14/2021 1101   K 3.7 04/14/2021 1101   CL 105 04/14/2021 1101   CO2 25 04/14/2021 1101   BUN 10 04/14/2021 1101    CREATININE 0.96 04/14/2021 1101      Component Value Date/Time   CALCIUM 8.6 (L) 04/14/2021 1101   ALKPHOS 61 04/14/2021 1101   AST 10 (L) 04/14/2021 1101   ALT 9 04/14/2021 1101   BILITOT 0.4 04/14/2021 1101

## 2021-04-17 ENCOUNTER — Telehealth: Payer: Self-pay | Admitting: *Deleted

## 2021-04-17 LAB — INHIBIN B: Inhibin B: 80.4 pg/mL

## 2021-04-17 NOTE — Telephone Encounter (Signed)
Per Dr Alvy Bimler and Dr Berline Lopes, scheduled the patient for a follow up appt with Dr Berline Lopes on 4/10

## 2021-04-18 ENCOUNTER — Telehealth: Payer: Self-pay

## 2021-04-18 NOTE — Telephone Encounter (Signed)
-----   Message from Heath Lark, MD sent at 04/17/2021  5:31 PM EST ----- Regarding: labs All her labs are ok Please call to let her know

## 2021-04-18 NOTE — Telephone Encounter (Signed)
Called and left below message. Ask her to call the office back for questions. 

## 2021-07-17 ENCOUNTER — Encounter: Payer: Self-pay | Admitting: Gynecologic Oncology

## 2021-07-17 ENCOUNTER — Other Ambulatory Visit: Payer: Self-pay

## 2021-07-17 ENCOUNTER — Inpatient Hospital Stay: Payer: Medicaid Other

## 2021-07-17 ENCOUNTER — Inpatient Hospital Stay: Payer: Medicaid Other | Attending: Gynecologic Oncology | Admitting: Gynecologic Oncology

## 2021-07-17 VITALS — BP 118/71 | HR 75 | Resp 18 | Ht 62.0 in | Wt 193.0 lb

## 2021-07-17 DIAGNOSIS — R635 Abnormal weight gain: Secondary | ICD-10-CM

## 2021-07-17 DIAGNOSIS — Z90721 Acquired absence of ovaries, unilateral: Secondary | ICD-10-CM | POA: Diagnosis not present

## 2021-07-17 DIAGNOSIS — Z8543 Personal history of malignant neoplasm of ovary: Secondary | ICD-10-CM | POA: Diagnosis present

## 2021-07-17 DIAGNOSIS — Z9049 Acquired absence of other specified parts of digestive tract: Secondary | ICD-10-CM | POA: Diagnosis not present

## 2021-07-17 DIAGNOSIS — D3911 Neoplasm of uncertain behavior of right ovary: Secondary | ICD-10-CM

## 2021-07-17 DIAGNOSIS — E669 Obesity, unspecified: Secondary | ICD-10-CM

## 2021-07-17 DIAGNOSIS — Z9221 Personal history of antineoplastic chemotherapy: Secondary | ICD-10-CM | POA: Diagnosis not present

## 2021-07-17 NOTE — Progress Notes (Signed)
Gynecologic Oncology Return Clinic Visit ? ?07/17/2021 ? ?Reason for Visit: Surveillance visit in the setting of a history of granulosa cell tumor of the ovary ? ?Treatment History: ?Oncology History Overview Note  ?1/14: inhibin B - 3,543 ?Inhibin A - 496 ? ?Presented initially on 04/21/20 with suspected pregnancy, increasing abdominal growth. 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 ?  ?Malignant granulosa cell tumor of ovary, left (HCC)  ?04/21/2020 Imaging  ? CT A/P: ?1. 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 Ovarian lesion is typically a fibroma in this setting. There is a cleavage plane between the adnexal mass and the uterus, which makes a pedunculated fibroid unlikely. ?2. There is a probable nabothian cyst in the cervix. ?3. Ascites causes mass effect on the abdominopelvic structures ?  ?04/22/2020 Procedure  ? Paracentesis, 9L ? ?FINAL MICROSCOPIC DIAGNOSIS:  ?- Atypical cells present  ?- See comment.  ?  ?04/23/2020 Procedure  ? Thoracentesis, 2L ? ?FINAL MICROSCOPIC DIAGNOSIS:  ?- Reactive mesothelial cells present ?  ?04/27/2020 Surgery  ? Robotic-assisted lysis of adhesions, left salpingoo-oophorectomy, laparotomy for specimen removal, ileocecal resection and reanastomosis, appendectomy, omentectomy ? ?Findings: On EUA, small mobile uterus with mass that moves in junction with the uterus, sitting out of the pelvis and palpable with the abdominal hand. Mass is firm and adherent to the anterior abdominal wall. On intra-abdominal entry, approximately 6L of green-tinged ascites noted and removed. Normal appearing upper abdominal survey. Normal appearing omentum. 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 zsome pockets of degeneration that are unavoidably entered during gentle manipulation  of the ovary. Mass with dense adhesions to the anterior abdominal wall on the right, the right sidewall, and several loops of ileum. Uterus 8cm and normal appearing. Right fallopian tube and ovary without evidence of disease. Some inflammatory rind noted on the small and large bowel. Given size of mass and its suspected degeneration and poor integrity, unable to place in an Endocatch bag and midline excision extended for specimen removal. Given concern for thermal damage to the terminal ileum from lyssi of dense adhesions, decision made to proceed with ileocectomy. 1-1.5cm mesenteric node palpated within the mesentery removed. Some shotty sub centimeter mesenteric nodes palpated. No para-aortic or pelvic lymphadenopathy. Liver edge, diaphragm and stomach smooth. Omentum without evidence of disease. An additional 3L of ascites was removed over the course of the surgery. ?  ?04/27/2020 Pathology Results  ? A. OVARY AND FALLOPIAN TUBE, LEFT, SALPINGO OOPHORECTOMY:  ?- Granulosa cell tumor, adult type, multiple fragments measuring 18.5 cm  ?in aggregate  ?- No evidence of ovarian surface involvement  ?- Benign unremarkable fallopian tube  ?- See oncology table  ? ?B. ILEOCECUM AND APPENDIX, RESECTION:  ?- Segment of terminal ileum (9 cm) and colon (10 cm) showing focal  ?serosal disruption  ?- Unremarkable appendix  ?- Margins appear viable  ?- Benign lymph nodes  ?- No evidence of malignancy  ? ?C. OMENTUM, RESECTION:  ?- Portion of omentum with congestion and mild mesothelial hyperplasia  ?- No evidence of malignancy  ? ?OVARY or FALLOPIAN TUBE or PRIMARY PERITONEUM: Resection  ? ?Procedure: Salpingo-oophorectomy  ?Specimen Integrity: Fragmented  ?Tumor Site: Left ovary  ?Tumor Size: Multiple tissue fragments measured 18.5 cm in aggregate  ?Histologic Type: Granulosa cell tumor, adult type  ?Histologic Grade: Not applicable  ?Ovarian Surface Involvement: Not identified  ?  Fallopian Tube Surface Involvement: Not  identified  ?Implants (required for advanced stage serous/seromucinous borderline  ?tumors only): Not applicable  ?Other Tissue/ Organ Involvement: Not applicable  ?Largest Extrapelvic Peritoneal Focus: Not identified  ?Peritoneal/Ascitic Fluid Involvement: Not identified ([id number])  ?Chemotherapy Response Score (CRS): Not applicable, no known presurgical  ?therapy  ?Regional Lymph Nodes: Not applicable (no lymph nodes submitted or found)  ?Distant Metastasis:  ?     Distant Site(s) Involved: Not applicable  ?Pathologic Stage Classification (pTNM, AJCC 8th Edition): pT1c, pN not  ?assigned (no nodes submitted or found).  See comment  ?Ancillary Studies: Can be performed upon request  ?Representative Tumor Block: A2  ?Comment(s): The tumor stage is due to intraoperative surgical spill of  ?the tumor cells based on discussion with the surgeon Dr. Belenda Cruise  ?Berline Lopes. ?  ?04/27/2020 Cancer Staging  ? Staging form: Ovary, Fallopian Tube, and Primary Peritoneal Carcinoma, AJCC 8th Edition ?- Clinical stage from 04/27/2020: FIGO Stage IC1, calculated as Stage IC (cT1c1, cN0, cM0) - Signed by Lafonda Mosses, MD on 05/05/2020 ? ?  ?05/05/2020 Initial Diagnosis  ? Granulosa cell tumor of ovary, right ?  ?05/30/2020 - 10/12/2020 Chemotherapy  ?  Patient is on Treatment Plan: OVARIAN CARBOPLATIN (AUC 6) / PACLITAXEL (175) Q21D X 6 CYCLES ? ?  ? ?  ?06/21/2020 Tumor Marker  ? Patient's tumor was tested for the following markers: Inhibin B ?Results of the tumor marker test revealed 17.5 ?  ?10/13/2020 Imaging  ? Negative. No evidence of recurrent or metastatic carcinoma within the abdomen or pelvis. ?  ? ? ?Interval History: ?Overall doing well.  She is happy to be back at work.  Reports energy is back to normal.  Is not currently exercising but would like to start to help her achieve some weight loss.  She continues to have normal monthly menses.  Denies any abdominal or pelvic pain.  Reports normal bowel and bladder  function. ? ?Past Medical/Surgical History: ?Past Medical History:  ?Diagnosis Date  ? 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 Ascites   ? Medical history non-contributory   ? Pelvic mass   ? ? ?Past Surgical History:  ?Procedure Laterality Date  ? IR PARACENTESIS  04/22/2020  ? LAPAROTOMY N/A 04/27/2020  ? Procedure: MINI LAPAROTOMY, ILEOCECAL RESECTION, APPENDECTOMY;  Surgeon: Lafonda Mosses, MD;  Location: [facility];  Service: Gynecology;  Laterality: N/A;  ? ROBOTIC ASSISTED SALPINGO OOPHERECTOMY Right 04/27/2020  ? Procedure: XI ROBOTIC ASSISTED UNITLATERAL SALPINGO OOPHORECTOMY;  Surgeon: Lafonda Mosses, MD;  Location: [facility];  Service: Gynecology;  Laterality: Right;  ? THORACENTESIS    ? WISDOM TOOTH EXTRACTION    ? ? ?Family History  ?Problem Relation Age of Onset  ? Diabetes Mother   ? Diabetes Father   ? Seizures Sister   ? Myasthenia gravis Sister   ? ADD / ADHD Sister   ? Ovarian cancer Neg Hx   ? Uterine cancer Neg Hx   ? Breast cancer Neg Hx   ? Colon cancer Neg Hx   ? ? ?Social History  ? ?Socioeconomic History  ? Marital status: Single  ?  Spouse name: Not on file  ? Number of children: 1  ? Years of education: Not on file  ? Highest education level: Not on file  ?Occupational History  ?  Employer: [employer]  ?Tobacco Use  ? Smoking status: Never  ? Smokeless tobacco: Never  ?Vaping Use  ? Vaping Use: Never used  ?Substance and Sexual  Activity  ? Alcohol use: No  ? Drug use: No  ? Sexual activity: Yes  ?  Birth control/protection: None  ?Other Topics Concern  ? Not on file  ?Social History Narrative  ? 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 ?Social Determinants of Health  ? ?Financial Resource Strain: Low Risk   ? Difficulty of Paying Living Expenses: Not very hard  ?Food Insecurity: No Food Insecurity  ? Worried About Charity fundraiser in the Last Year: Never true  ? Ran Out of Food in the Last Year: Never true  ?Transportation Needs: No Transportation Needs  ? Lack of Transportation (Medical): No   ? Lack of Transportation (Non-Medical): No  ?Physical Activity: Sufficiently Active  ? Days of Exercise per Week: 4 days  ? Minutes of Exercise per Session: 50 min  ?Stress: No Stress Concern Present  ? Feeling of Stress :

## 2021-07-17 NOTE — Patient Instructions (Signed)
It was good to see you today.  I do not feel or see any evidence of cancer recurrence on your exam. ? ?I will release your lab work when it is back, which will take about a week. ? ?We will continue with visits every 3 months.  You see Dr. Alvy Bimler in July.  I would like to see you back in late September or early October.  When you come in July for your visit, please ask her nurse or Santiago Glad to get you scheduled with me.  My schedule is not out past the end of June at this time. ? ?If you develop any new symptoms before your next visit, please call to see me sooner. ? ?The phone apps that we talked about today that can be helpful for tracking your food and drink intake as well as your exercise are: ? ?LoseIt! ?Myfitnesspal ?

## 2021-07-19 ENCOUNTER — Other Ambulatory Visit: Payer: Self-pay | Admitting: Gynecologic Oncology

## 2021-07-19 DIAGNOSIS — D3911 Neoplasm of uncertain behavior of right ovary: Secondary | ICD-10-CM

## 2021-07-19 LAB — INHIBIN B: Inhibin B: <7 pg/mL

## 2021-07-20 ENCOUNTER — Telehealth: Payer: Self-pay | Admitting: *Deleted

## 2021-07-20 NOTE — Telephone Encounter (Signed)
Spoke with Brandy Knight this afternoon and reviewed her Inhibin-B results. Per Dr. Berline Lopes Tumor marker is very low (lower than I would expect and what we would normally see in someone in menopause). I'd like to repeat this in the next few weeks. I'll have the office call you to schedule a lab visit. ? ?Scheduled her for a repeat lab draw on 08/14/21 at 09:15am. Patient verbalized understanding and agreeable to appointment.  ?

## 2021-07-20 NOTE — Telephone Encounter (Signed)
Per Dr Berline Lopes called and left the patient a message to call the office back. Patient needs to be scheduled for a lab appt in 2-4 weeks from 4/10.  ?

## 2021-08-14 ENCOUNTER — Other Ambulatory Visit: Payer: Self-pay

## 2021-08-14 ENCOUNTER — Inpatient Hospital Stay: Payer: Medicaid Other | Attending: Gynecologic Oncology

## 2021-08-14 DIAGNOSIS — D3911 Neoplasm of uncertain behavior of right ovary: Secondary | ICD-10-CM

## 2021-08-14 DIAGNOSIS — Z8543 Personal history of malignant neoplasm of ovary: Secondary | ICD-10-CM | POA: Insufficient documentation

## 2021-08-14 LAB — CBC WITH DIFFERENTIAL (CANCER CENTER ONLY)
Abs Immature Granulocytes: 0.01 10*3/uL (ref 0.00–0.07)
Basophils Absolute: 0.1 10*3/uL (ref 0.0–0.1)
Basophils Relative: 1 %
Eosinophils Absolute: 0.2 10*3/uL (ref 0.0–0.5)
Eosinophils Relative: 3 %
HCT: 35.6 % — ABNORMAL LOW (ref 36.0–46.0)
Hemoglobin: 11.2 g/dL — ABNORMAL LOW (ref 12.0–15.0)
Immature Granulocytes: 0 %
Lymphocytes Relative: 35 %
Lymphs Abs: 2.1 10*3/uL (ref 0.7–4.0)
MCH: 25.9 pg — ABNORMAL LOW (ref 26.0–34.0)
MCHC: 31.5 g/dL (ref 30.0–36.0)
MCV: 82.4 fL (ref 80.0–100.0)
Monocytes Absolute: 0.3 10*3/uL (ref 0.1–1.0)
Monocytes Relative: 5 %
Neutro Abs: 3.3 10*3/uL (ref 1.7–7.7)
Neutrophils Relative %: 56 %
Platelet Count: 165 10*3/uL (ref 150–400)
RBC: 4.32 MIL/uL (ref 3.87–5.11)
RDW: 13.2 % (ref 11.5–15.5)
WBC Count: 5.9 10*3/uL (ref 4.0–10.5)
nRBC: 0 % (ref 0.0–0.2)

## 2021-08-14 LAB — CMP (CANCER CENTER ONLY)
ALT: 11 U/L (ref 0–44)
AST: 10 U/L — ABNORMAL LOW (ref 15–41)
Albumin: 3.5 g/dL (ref 3.5–5.0)
Alkaline Phosphatase: 69 U/L (ref 38–126)
Anion gap: 5 (ref 5–15)
BUN: 10 mg/dL (ref 6–20)
CO2: 29 mmol/L (ref 22–32)
Calcium: 8.4 mg/dL — ABNORMAL LOW (ref 8.9–10.3)
Chloride: 105 mmol/L (ref 98–111)
Creatinine: 1.05 mg/dL — ABNORMAL HIGH (ref 0.44–1.00)
GFR, Estimated: >60 mL/min (ref >60)
Glucose, Bld: 92 mg/dL (ref 70–99)
Potassium: 3.6 mmol/L (ref 3.5–5.1)
Sodium: 139 mmol/L (ref 135–145)
Total Bilirubin: 0.3 mg/dL (ref 0.3–1.2)
Total Protein: 6.8 g/dL (ref 6.5–8.1)

## 2021-10-12 ENCOUNTER — Inpatient Hospital Stay (HOSPITAL_BASED_OUTPATIENT_CLINIC_OR_DEPARTMENT_OTHER): Payer: Medicaid Other | Admitting: Hematology and Oncology

## 2021-10-12 ENCOUNTER — Encounter: Payer: Self-pay | Admitting: Hematology and Oncology

## 2021-10-12 ENCOUNTER — Other Ambulatory Visit: Payer: Self-pay

## 2021-10-12 ENCOUNTER — Inpatient Hospital Stay: Payer: Medicaid Other | Attending: Gynecologic Oncology

## 2021-10-12 DIAGNOSIS — Z90721 Acquired absence of ovaries, unilateral: Secondary | ICD-10-CM | POA: Diagnosis not present

## 2021-10-12 DIAGNOSIS — Z9221 Personal history of antineoplastic chemotherapy: Secondary | ICD-10-CM | POA: Insufficient documentation

## 2021-10-12 DIAGNOSIS — D3911 Neoplasm of uncertain behavior of right ovary: Secondary | ICD-10-CM

## 2021-10-12 DIAGNOSIS — R635 Abnormal weight gain: Secondary | ICD-10-CM | POA: Diagnosis not present

## 2021-10-12 DIAGNOSIS — Z8543 Personal history of malignant neoplasm of ovary: Secondary | ICD-10-CM | POA: Insufficient documentation

## 2021-10-12 DIAGNOSIS — C562 Malignant neoplasm of left ovary: Secondary | ICD-10-CM | POA: Diagnosis not present

## 2021-10-12 LAB — CMP (CANCER CENTER ONLY)
ALT: 10 U/L (ref 0–44)
AST: 9 U/L — ABNORMAL LOW (ref 15–41)
Albumin: 3.7 g/dL (ref 3.5–5.0)
Alkaline Phosphatase: 71 U/L (ref 38–126)
Anion gap: 6 (ref 5–15)
BUN: 10 mg/dL (ref 6–20)
CO2: 29 mmol/L (ref 22–32)
Calcium: 9.1 mg/dL (ref 8.9–10.3)
Chloride: 104 mmol/L (ref 98–111)
Creatinine: 0.92 mg/dL (ref 0.44–1.00)
GFR, Estimated: >60 mL/min (ref >60)
Glucose, Bld: 102 mg/dL — ABNORMAL HIGH (ref 70–99)
Potassium: 3.7 mmol/L (ref 3.5–5.1)
Sodium: 139 mmol/L (ref 135–145)
Total Bilirubin: 0.3 mg/dL (ref 0.3–1.2)
Total Protein: 7.4 g/dL (ref 6.5–8.1)

## 2021-10-12 LAB — CBC WITH DIFFERENTIAL (CANCER CENTER ONLY)
Abs Immature Granulocytes: 0.01 10*3/uL (ref 0.00–0.07)
Basophils Absolute: 0.1 10*3/uL (ref 0.0–0.1)
Basophils Relative: 1 %
Eosinophils Absolute: 0.2 10*3/uL (ref 0.0–0.5)
Eosinophils Relative: 2 %
HCT: 38.2 % (ref 36.0–46.0)
Hemoglobin: 12.4 g/dL (ref 12.0–15.0)
Immature Granulocytes: 0 %
Lymphocytes Relative: 35 %
Lymphs Abs: 2.5 10*3/uL (ref 0.7–4.0)
MCH: 26.4 pg (ref 26.0–34.0)
MCHC: 32.5 g/dL (ref 30.0–36.0)
MCV: 81.4 fL (ref 80.0–100.0)
Monocytes Absolute: 0.4 10*3/uL (ref 0.1–1.0)
Monocytes Relative: 6 %
Neutro Abs: 4 10*3/uL (ref 1.7–7.7)
Neutrophils Relative %: 56 %
Platelet Count: 188 10*3/uL (ref 150–400)
RBC: 4.69 MIL/uL (ref 3.87–5.11)
RDW: 13.4 % (ref 11.5–15.5)
WBC Count: 7.2 10*3/uL (ref 4.0–10.5)
nRBC: 0 % (ref 0.0–0.2)

## 2021-10-12 NOTE — Assessment & Plan Note (Signed)
She has tremendous weight gain since completion of treatment We discussed importance of dietary modification and exercise

## 2021-10-12 NOTE — Assessment & Plan Note (Signed)
Her last imaging studies showed no evidence of disease She has no clinical signs or symptoms to suggest cancer recurrence She will see Dr. Berline Lopes in 3 months I plan to see her again in 6 months

## 2021-10-12 NOTE — Progress Notes (Signed)
[place] OFFICE PROGRESS NOTE  Patient Care Team: Pcp, No as PCP - General Lafonda Mosses, MD as Consulting Physician (Gynecologic Oncology) Dorothyann Gibbs, NP as Nurse Practitioner (Gynecologic Oncology) Heath Lark, MD as Consulting Physician (Hematology and Oncology) Harmon Pier, RN as Registered Nurse  ASSESSMENT & PLAN:  Malignant granulosa cell tumor of ovary, left [facility]) Her last imaging studies showed no evidence of disease She has no clinical signs or symptoms to suggest cancer recurrence She will see Dr. Berline Lopes in 3 months I plan to see her again in 6 months  Weight gain She has tremendous weight gain since completion of treatment We discussed importance of dietary modification and exercise  No orders of the defined types were placed in this encounter.   All questions were answered. The patient knows to call the clinic with any problems, questions or concerns. The total time spent in the appointment was 20 minutes encounter with patients including review of chart and various tests results, discussions about plan of care and coordination of care plan   Heath Lark, MD 10/12/2021 9:21 AM  INTERVAL HISTORY: Please see below for problem oriented charting. she returns for surveillance follow-up for granulosa cell of ovary, completed adjuvant treatment She is doing well She has regular menstrual cycle Denies abdominal pain, changes in bowel habits of bloating She continues to gain a lot of weight over the last few months She admits that she is not watching/selecting healthy food choices and not exercising  REVIEW OF SYSTEMS:   Constitutional: Denies fevers, chills or abnormal weight loss Eyes: Denies blurriness of vision Ears, nose, mouth, throat, and face: Denies mucositis or sore throat Respiratory: Denies cough, dyspnea or wheezes Cardiovascular: Denies palpitation, chest discomfort or lower extremity swelling Gastrointestinal:  Denies nausea,  heartburn or change in bowel habits Skin: Denies abnormal skin rashes Lymphatics: Denies new lymphadenopathy or easy bruising Neurological:Denies numbness, tingling or new weaknesses Behavioral/Psych: Mood is stable, no new changes  All other systems were reviewed with the patient and are negative.  I have reviewed the past medical history, past surgical history, social history and family history with the patient and they are unchanged from previous note.  ALLERGIES:  has No Known Allergies.  MEDICATIONS:  No current outpatient medications on file.   No current facility-administered medications for this visit.    SUMMARY OF ONCOLOGIC HISTORY: Oncology History Overview Note  1/14: inhibin B - 3,543 Inhibin A - 59  Presented initially on 04/21/20 with suspected pregnancy, increasing abdominal growth. 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   Malignant granulosa cell tumor of ovary, left (HCC)  04/21/2020 Imaging   CT A/P: 1. 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 Ovarian lesion is typically a fibroma in this setting. There is a cleavage plane between the adnexal mass and the uterus, which makes a pedunculated fibroid unlikely. 2. There is a probable nabothian cyst in the cervix. 3. Ascites causes mass effect on the abdominopelvic structures   04/22/2020 Procedure   Paracentesis, 9L  FINAL MICROSCOPIC DIAGNOSIS:  - Atypical cells present  - See comment.    04/23/2020 Procedure   Thoracentesis, 2L  FINAL MICROSCOPIC DIAGNOSIS:  - Reactive mesothelial cells present   04/27/2020 Surgery   Robotic-assisted lysis of adhesions, left salpingoo-oophorectomy, laparotomy for specimen removal, ileocecal resection and reanastomosis, appendectomy, omentectomy  Findings: On EUA,  small mobile uterus with mass  that moves in junction with the uterus, sitting out of the pelvis and palpable with the abdominal hand. Mass is firm and adherent to the anterior abdominal wall. On intra-abdominal entry, approximately 6L of green-tinged ascites noted and removed. Normal appearing upper abdominal survey. Normal appearing omentum. 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 zsome pockets of degeneration that are unavoidably entered during gentle manipulation of the ovary. Mass with dense adhesions to the anterior abdominal wall on the right, the right sidewall, and several loops of ileum. Uterus 8cm and normal appearing. Right fallopian tube and ovary without evidence of disease. Some inflammatory rind noted on the small and large bowel. Given size of mass and its suspected degeneration and poor integrity, unable to place in an Endocatch bag and midline excision extended for specimen removal. Given concern for thermal damage to the terminal ileum from lyssi of dense adhesions, decision made to proceed with ileocectomy. 1-1.5cm mesenteric node palpated within the mesentery removed. Some shotty sub centimeter mesenteric nodes palpated. No para-aortic or pelvic lymphadenopathy. Liver edge, diaphragm and stomach smooth. Omentum without evidence of disease. An additional 3L of ascites was removed over the course of the surgery.   04/27/2020 Pathology Results   A. OVARY AND FALLOPIAN TUBE, LEFT, SALPINGO OOPHORECTOMY:  - Granulosa cell tumor, adult type, multiple fragments measuring 18.5 cm  in aggregate  - No evidence of ovarian surface involvement  - Benign unremarkable fallopian tube  - See oncology table   B. ILEOCECUM AND APPENDIX, RESECTION:  - Segment of terminal ileum (9 cm) and colon (10 cm) showing focal  serosal disruption  - Unremarkable appendix  - Margins appear viable  - Benign lymph nodes  - No evidence of malignancy   C. OMENTUM, RESECTION:  - Portion of omentum  with congestion and mild mesothelial hyperplasia  - No evidence of malignancy   OVARY or FALLOPIAN TUBE or PRIMARY PERITONEUM: Resection   Procedure: Salpingo-oophorectomy  Specimen Integrity: Fragmented  Tumor Site: Left ovary  Tumor Size: Multiple tissue fragments measured 18.5 cm in aggregate  Histologic Type: Granulosa cell tumor, adult type  Histologic Grade: Not applicable  Ovarian Surface Involvement: Not identified  Fallopian Tube Surface Involvement: Not identified  Implants (required for advanced stage serous/seromucinous borderline  tumors only): Not applicable  Other Tissue/ Organ Involvement: Not applicable  Largest Extrapelvic Peritoneal Focus: Not identified  Peritoneal/Ascitic Fluid Involvement: Not identified ([id number])  Chemotherapy Response Score (CRS): Not applicable, no known presurgical  therapy  Regional Lymph Nodes: Not applicable (no lymph nodes submitted or found)  Distant Metastasis:       Distant Site(s) Involved: Not applicable  Pathologic Stage Classification (pTNM, AJCC 8th Edition): pT1c, pN not  assigned (no nodes submitted or found).  See comment  Ancillary Studies: Can be performed upon request  Representative Tumor Block: A2  Comment(s): The tumor stage is due to intraoperative surgical spill of  the tumor cells based on discussion with the surgeon Dr. Jeral Pinch.   04/27/2020 Cancer Staging   Staging form: Ovary, Fallopian Tube, and Primary Peritoneal Carcinoma, AJCC 8th Edition - Clinical stage from 04/27/2020: FIGO Stage IC1, calculated as Stage IC (cT1c1, cN0, cM0) - Signed by Lafonda Mosses, MD on 05/05/2020   05/05/2020 Initial Diagnosis   Granulosa cell tumor of ovary, right   05/30/2020 - 10/12/2020 Chemotherapy    Patient is on Treatment Plan: OVARIAN CARBOPLATIN (AUC 6) / PACLITAXEL (175) Q21D X 6  CYCLES       06/21/2020 Tumor Marker   Patient's tumor was tested for the following markers: Inhibin B Results of the tumor  marker test revealed 17.5   10/13/2020 Imaging   Negative. No evidence of recurrent or metastatic carcinoma within the abdomen or pelvis.     PHYSICAL EXAMINATION: ECOG PERFORMANCE STATUS: 0 - Asymptomatic  Vitals:   10/12/21 0906  BP: 111/70  Pulse: 73  Resp: 18  Temp: 97.9 F (36.6 C)  SpO2: 100%   Filed Weights   10/12/21 0906  Weight: 204 lb 9.6 oz (92.8 kg)    GENERAL:alert, no distress and comfortable SKIN: skin color, texture, turgor are normal, no rashes or significant lesions EYES: normal, Conjunctiva are pink and non-injected, sclera clear OROPHARYNX:no exudate, no erythema and lips, buccal mucosa, and tongue normal  NECK: supple, thyroid normal size, non-tender, without nodularity LYMPH:  no palpable lymphadenopathy in the cervical, axillary or inguinal LUNGS: clear to auscultation and percussion with normal breathing effort HEART: regular rate & rhythm and no murmurs and no lower extremity edema ABDOMEN:abdomen soft, non-tender and normal bowel sounds Musculoskeletal:no cyanosis of digits and no clubbing  NEURO: alert & oriented x 3 with fluent speech, no focal motor/sensory deficits  LABORATORY DATA:  I have reviewed the data as listed    Component Value Date/Time   NA 139 08/14/2021 0922   K 3.6 08/14/2021 0922   CL 105 08/14/2021 0922   CO2 29 08/14/2021 0922   GLUCOSE 92 08/14/2021 0922   BUN 10 08/14/2021 0922   CREATININE 1.05 (H) 08/14/2021 0922   CALCIUM 8.4 (L) 08/14/2021 0922   PROT 6.8 08/14/2021 0922   ALBUMIN 3.5 08/14/2021 0922   AST 10 (L) 08/14/2021 0922   ALT 11 08/14/2021 0922   ALKPHOS 69 08/14/2021 0922   BILITOT 0.3 08/14/2021 0922   GFRNONAA >60 08/14/2021 0922    No results found for: "SPEP", "UPEP"  Lab Results  Component Value Date   WBC 7.2 10/12/2021   NEUTROABS 4.0 10/12/2021   HGB 12.4 10/12/2021   HCT 38.2 10/12/2021   MCV 81.4 10/12/2021   PLT 188 10/12/2021      Chemistry      Component Value Date/Time    NA 139 08/14/2021 0922   K 3.6 08/14/2021 0922   CL 105 08/14/2021 0922   CO2 29 08/14/2021 0922   BUN 10 08/14/2021 0922   CREATININE 1.05 (H) 08/14/2021 0922      Component Value Date/Time   CALCIUM 8.4 (L) 08/14/2021 0922   ALKPHOS 69 08/14/2021 0922   AST 10 (L) 08/14/2021 0922   ALT 11 08/14/2021 0922   BILITOT 0.3 08/14/2021 5003

## 2021-10-13 LAB — INHIBIN B: Inhibin B: 69.7 pg/mL

## 2022-04-08 IMAGING — CT CT ABD-PELV W/ CM
2 of 4 series · 14 of 46 positions shown, 16 images · IV contrast (APPLIED)
Comparison: None.

CLINICAL DATA: Ascites 29 y/o presented thinking she was pregnant,
no pregnancy seen on US, massive ascites with abnormal appearance of
uterus on bedside US

EXAM:
CT ABDOMEN AND PELVIS WITH CONTRAST
TECHNIQUE: Multidetector CT imaging of the abdomen and pelvis was performed
using the standard protocol following bolus administration of
intravenous contrast.
CONTRAST:  100mL OMNIPAQUE IOHEXOL 300 MG/ML  SOLN

[Series 3: abdomen 5.0 · axial · 0.82mm/px · z∈[+820,+1300]mm · 11 of 108 slices shown, 13 images]
[im 6/108  soft-tissue]
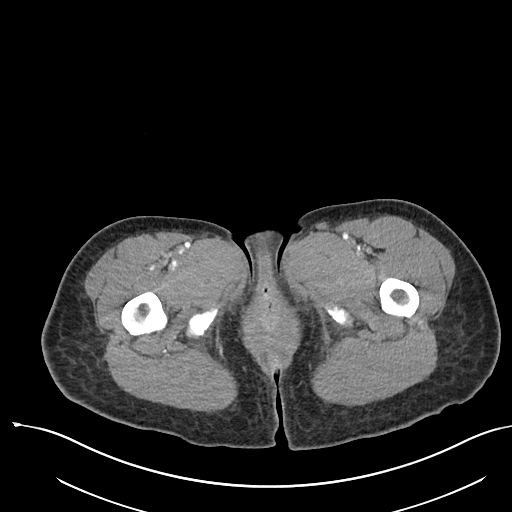
[im 6/108  bone]
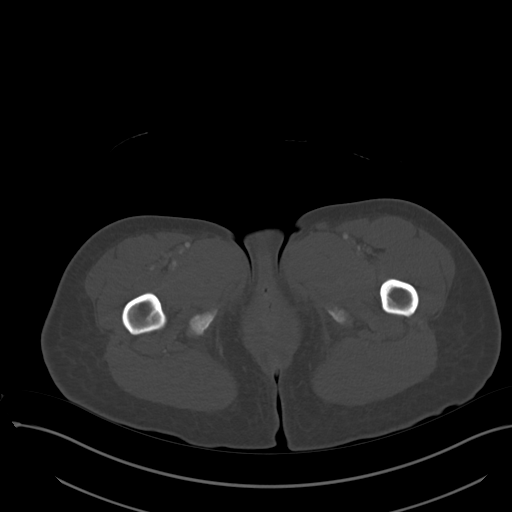
[im 17/108  soft-tissue]
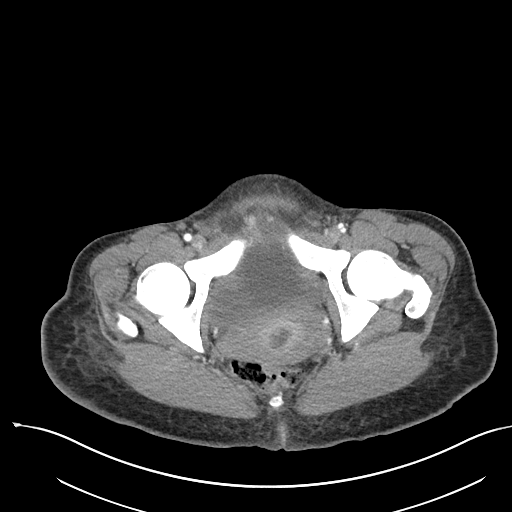
[im 29/108  soft-tissue]
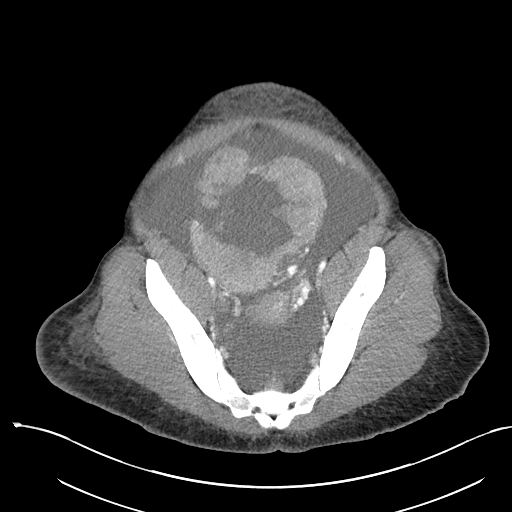
[im 34/108  soft-tissue]
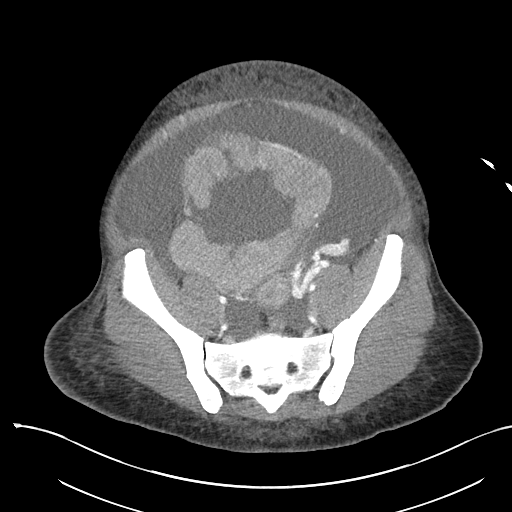
[im 46/108  soft-tissue]
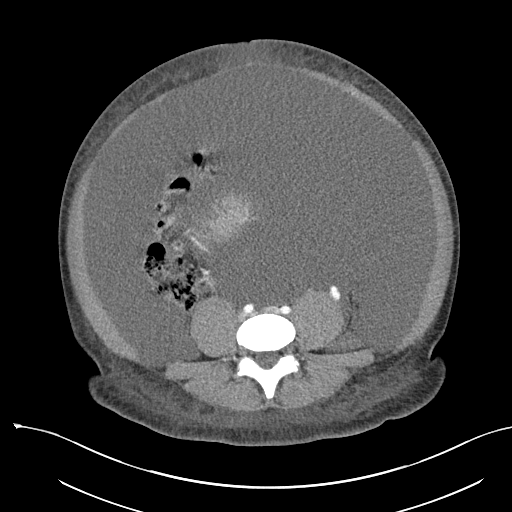
[im 57/108  soft-tissue]
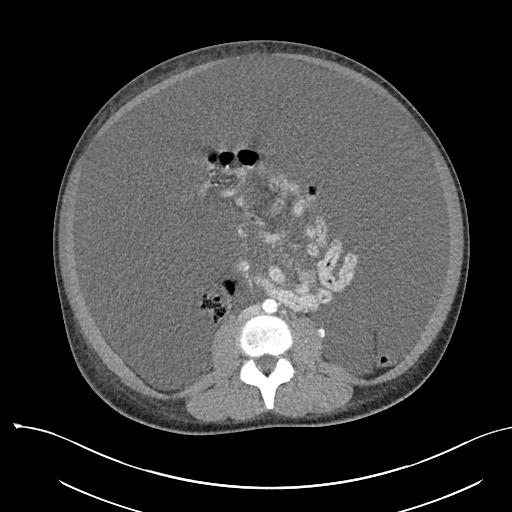
[im 62/108  soft-tissue]
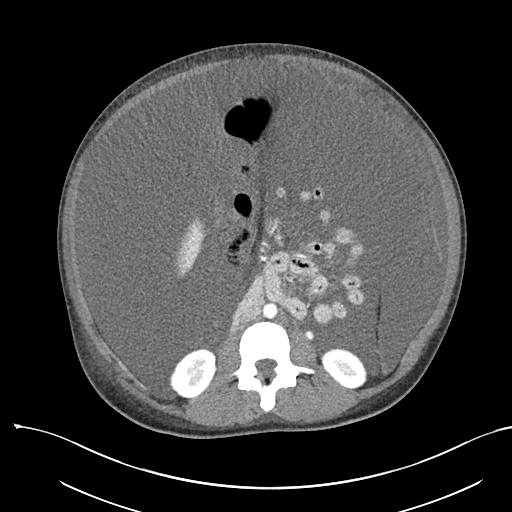
[im 74/108  soft-tissue]
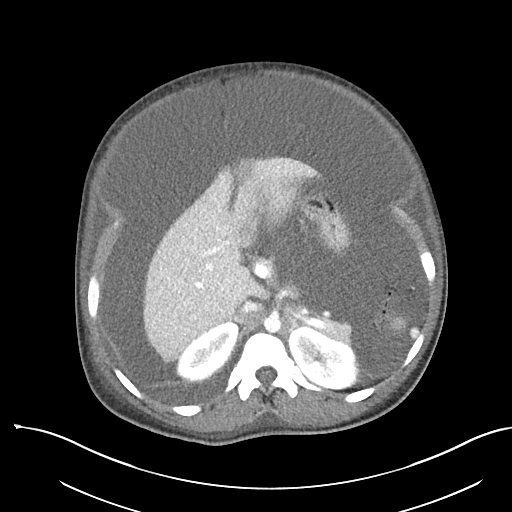
[im 79/108  soft-tissue]
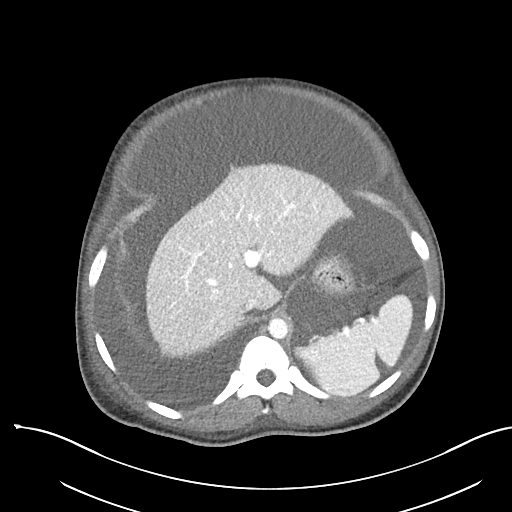
[im 79/108  bone]
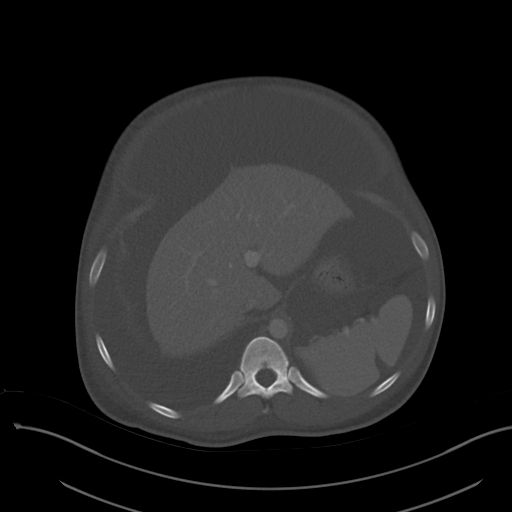
[im 91/108  soft-tissue]
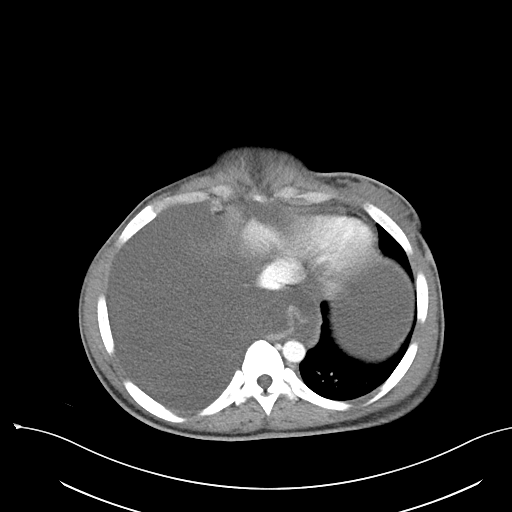
[im 102/108  soft-tissue]
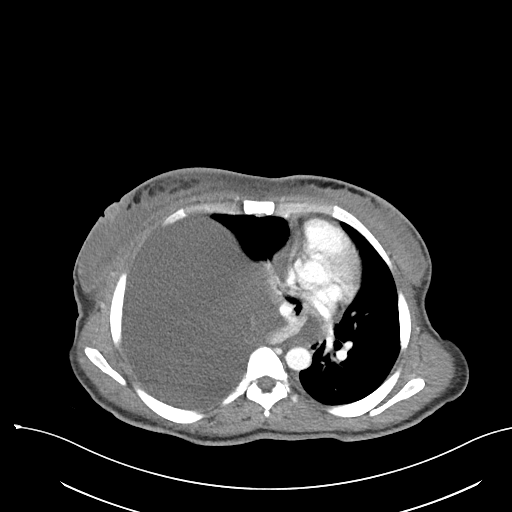

[Series 6: abdomen 3.0 mpr cor · coronal · 0.75mm/px · 3 of 123 slices shown]
[im 41/123  soft-tissue]
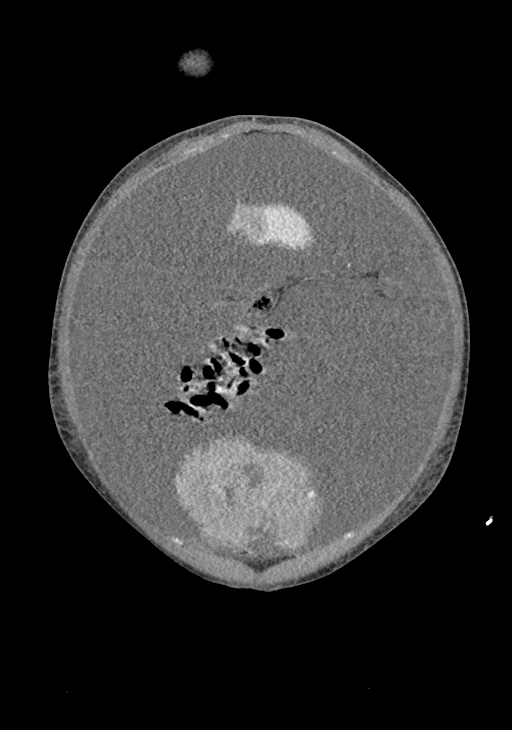
[im 55/123  soft-tissue]
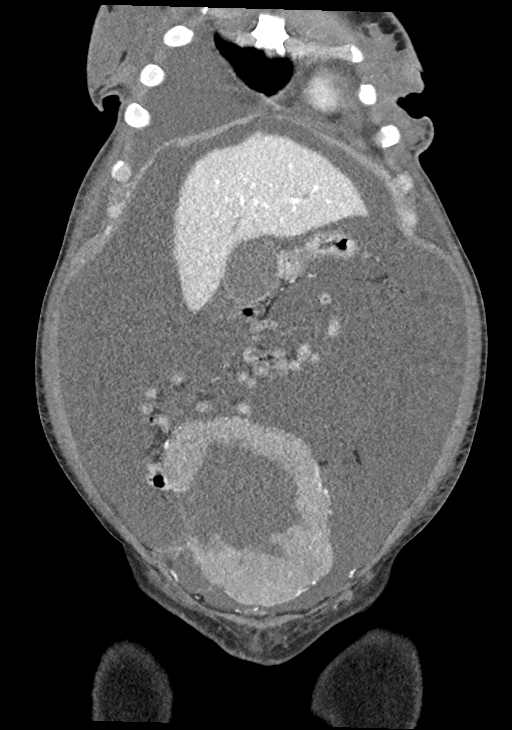
[im 68/123  soft-tissue]
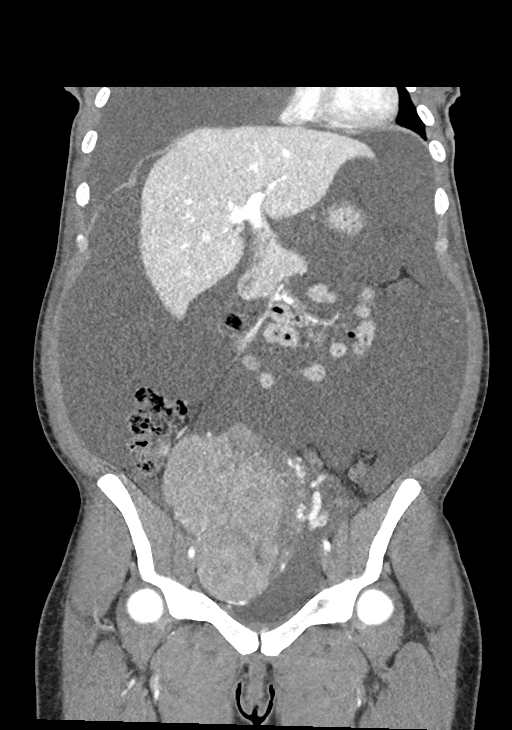

[14 of 46 positions shown; findings below may reference images not displayed]

FINDINGS: Lower chest: Large right pleural effusion. Compressive atelectasis
of the included right lung. There is leftward mediastinal shift.

Hepatobiliary: Low-density adjacent to the falciform ligament is
typical of focal fatty infiltration. There is no discrete focal
liver lesion. The gallbladder is physiologically distended. There is
no calcified gallstone. No intrahepatic biliary ductal dilatation.
Common bile duct is not well seen.

Pancreas: No ductal dilatation or inflammation. There is no evidence
of pancreatic mass.

Spleen: Normal in size. Cleft posteriorly. No focal splenic lesion.

Adrenals/Urinary Tract: No adrenal nodule. No hydronephrosis. There
is no perinephric edema. The urinary bladder appears completely
decompressed.

Stomach/Bowel: Bowel evaluation is limited in the absence of enteric
contrast and presence of massive ascites. Ascites causes central
displacement of small-bowel loops. No small bowel distension. Normal
appendix tentatively visualized, series 3, image 65. Majority of the
colon is decompressed. There is no obvious colonic wall thickening.

Vascular/Lymphatic: Normal caliber abdominal aorta. Patent portal
vein. There is prominence of the left ovarian and gonadal
vasculature. No evidence of adenopathy, allowing for limitations
related to ascites.

Reproductive: Heterogeneous enhancing large right adnexal mass
measures 14.7 x 12.8 x 13.6 cm. Lobulated enhancing components
peripherally with central low-density. No normal right ovary is
seen. There is a clear fat plane between this lesion and the uterus.
Normal left ovary is tentatively visualized superior to the uterus
just to the left of midline. Cystic structure in the region of the
cervix is likely a nabothian cyst.

Other: Massive abdominopelvic ascites. Small amount ascites leaks
into the umbilicus. There is generalized subcutaneous edema. No free
air. There is no obvious omental thickening, however degree of
ascites partially obscures.

Musculoskeletal: There are no acute or suspicious osseous
abnormalities.
IMPRESSION: 1. Overall findings highly suggestive of Meig's syndrome with large
right adnexal mass, massive abdominopelvic ascites, and large right
pleural effusion. Ovarian lesion is typically a fibroma in this
setting. There is a cleavage plane between the adnexal mass and the
uterus, which makes a pedunculated fibroid unlikely.
2. There is a probable nabothian cyst in the cervix.
3. Ascites causes mass effect on the abdominopelvic structures.

## 2022-04-09 IMAGING — US IR PARACENTESIS
1 series · 3 of 3 positions shown · non-contrast
Comparison: none

INDICATION: Patient admitted for right adnexal/ovarian mass with massive
abdominopelvic ascites concerning for Meig's syndrome.
Interventional radiology asked to perform a therapeutic and
diagnostic paracentesis.

[Series 1: (id) · 3 of 3 slices shown]
[im 1/3]
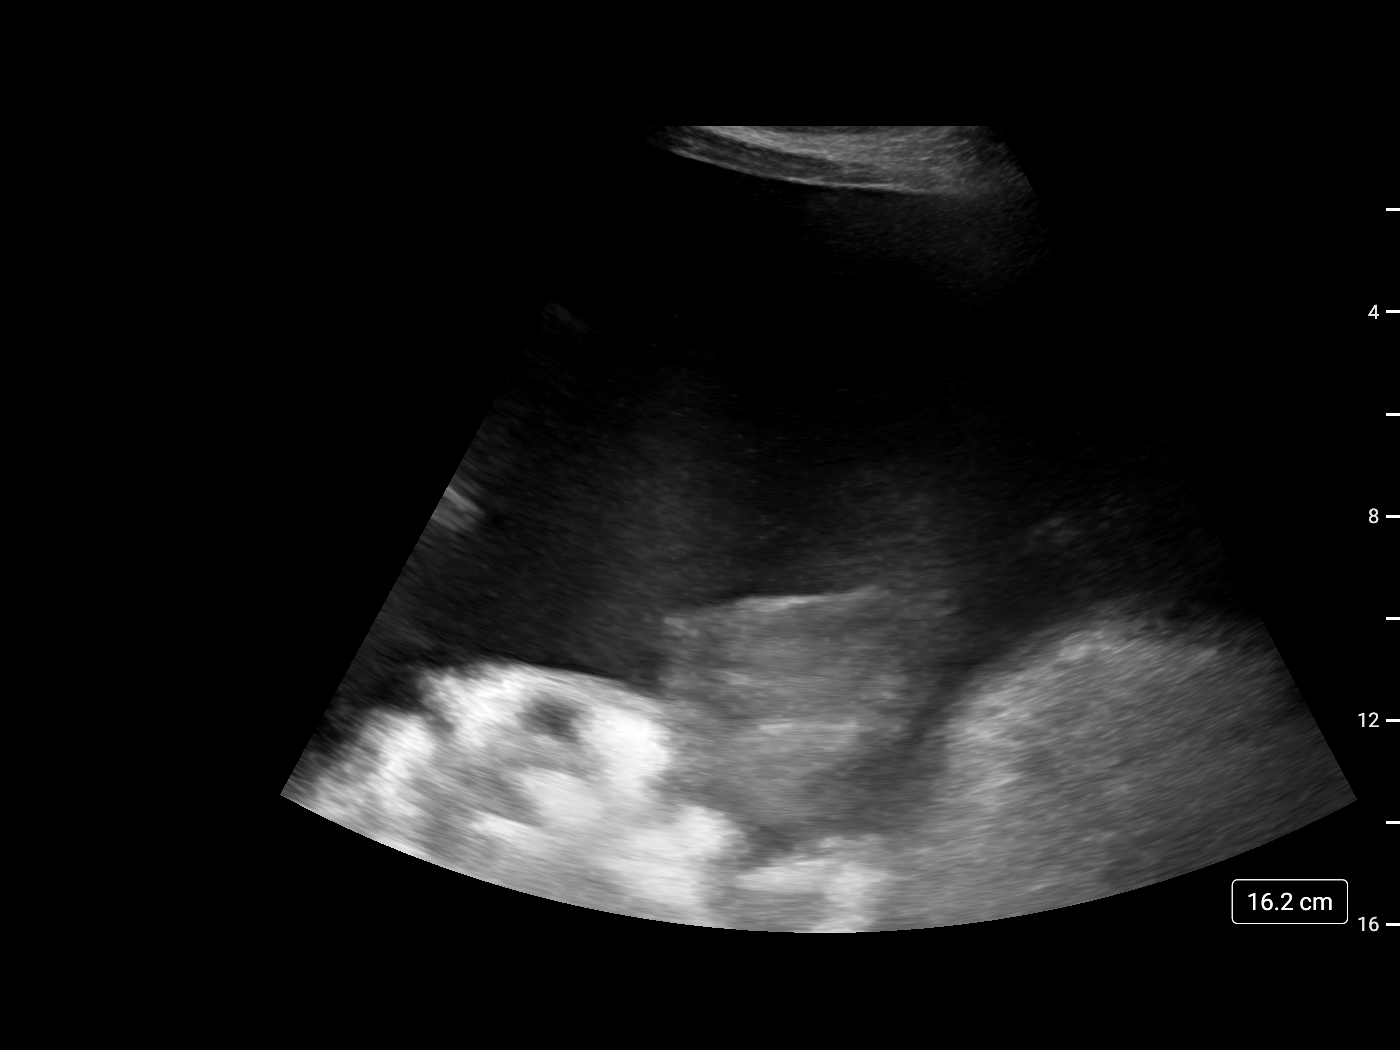
[im 2/3]
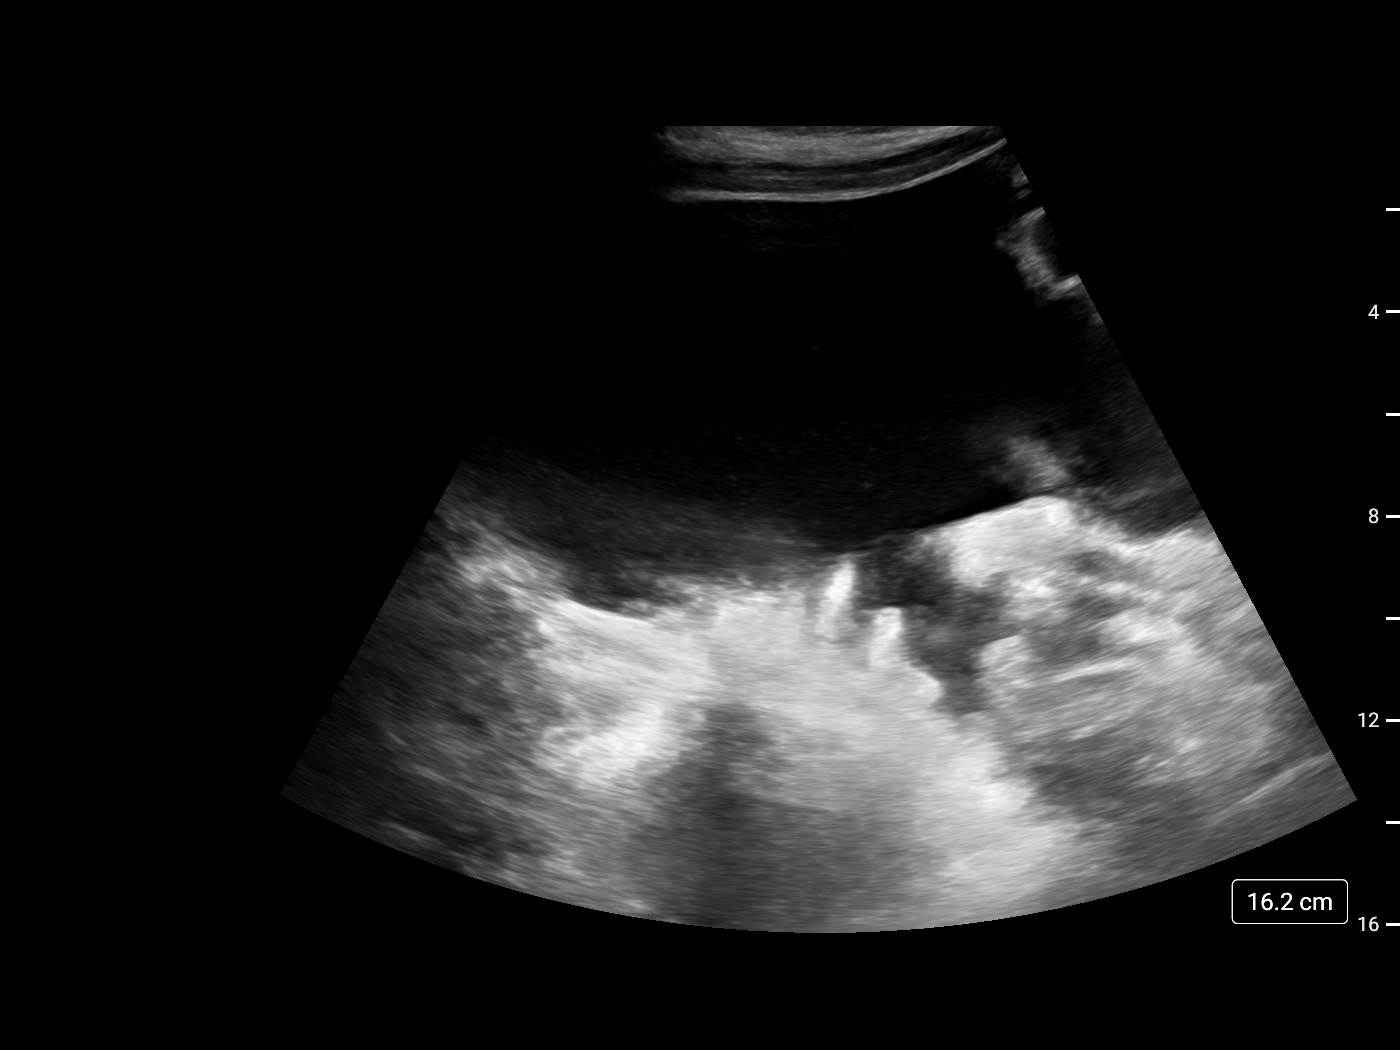
[im 3/3]
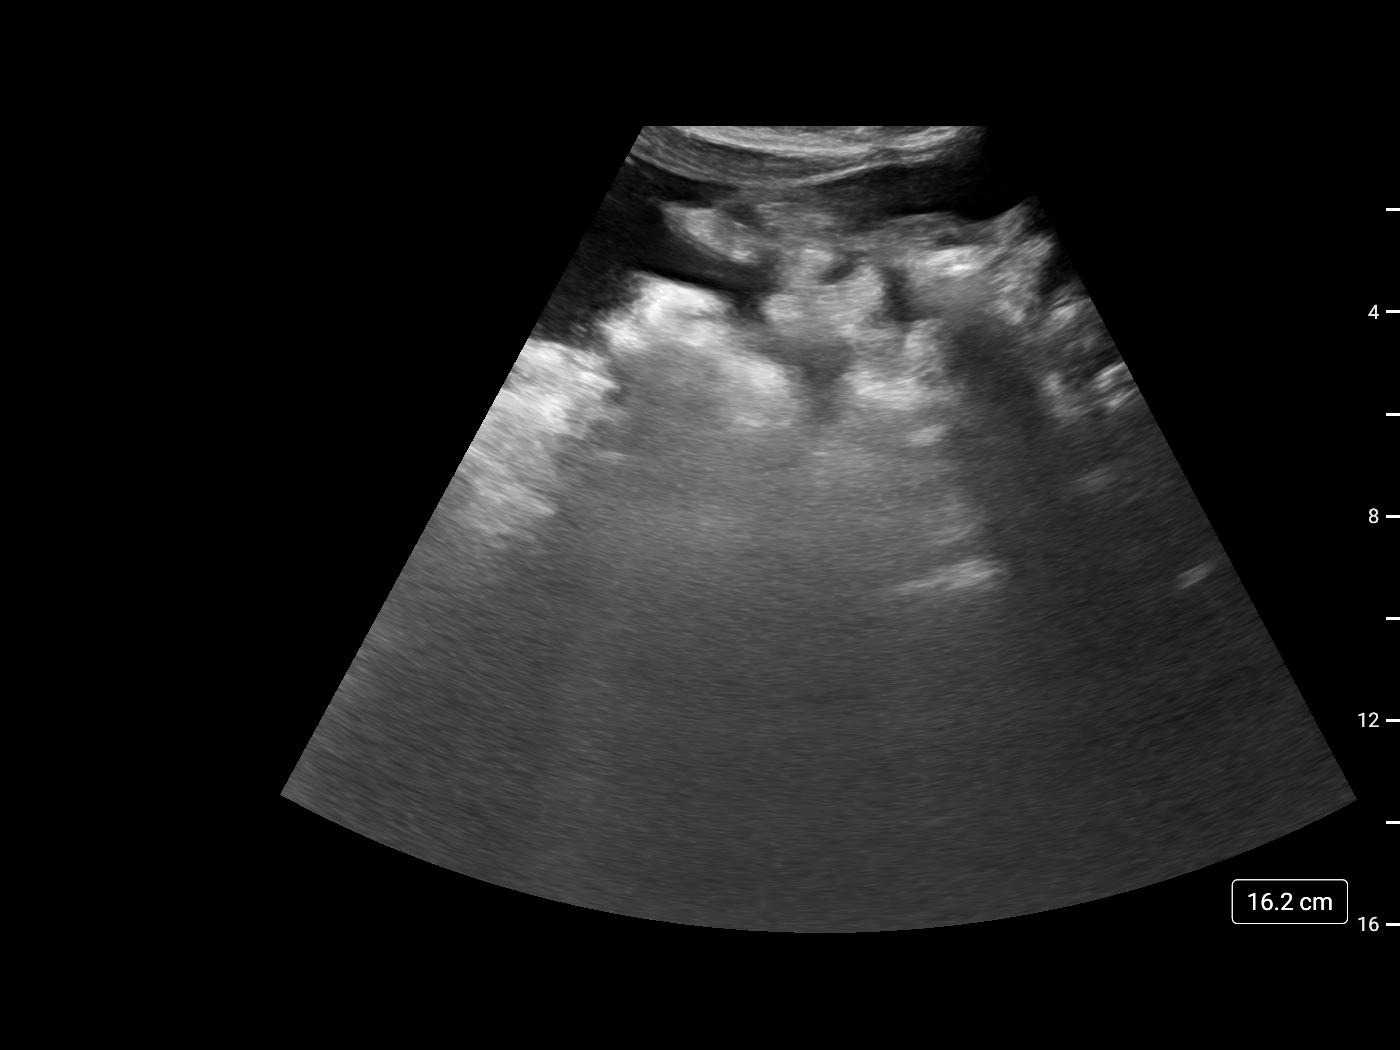

[3 of 3 positions shown; findings below may reference images not displayed]

EXAM:
ULTRASOUND GUIDED PARACENTESIS

MEDICATIONS:
1% lidocaine 15 mL

COMPLICATIONS:
None immediate.

PROCEDURE:
Informed written consent was obtained from the patient after a
discussion of the risks, benefits and alternatives to treatment. A
timeout was performed prior to the initiation of the procedure.

Initial ultrasound scanning demonstrates a large amount of ascites
within the left lower abdominal quadrant. The left lower abdomen was
prepped and draped in the usual sterile fashion. 1% lidocaine was
used for local anesthesia.

Following this, a 19 gauge, 7-cm, Yueh catheter was introduced. An
ultrasound image was saved for documentation purposes. The
paracentesis was performed. The catheter was removed and a dressing
was applied. The patient tolerated the procedure well without
immediate post procedural complication.
FINDINGS: A total of approximately 9.1 L of clear yellow fluid was removed.
Samples were sent to the laboratory as requested by the clinical
team.
IMPRESSION: Successful ultrasound-guided paracentesis yielding 9.1 liters of
peritoneal fluid. Read by: Abed M Nassra, NP

## 2022-04-16 ENCOUNTER — Other Ambulatory Visit: Payer: Self-pay

## 2022-04-16 ENCOUNTER — Inpatient Hospital Stay: Payer: Medicaid Other | Attending: Nurse Practitioner

## 2022-04-16 ENCOUNTER — Inpatient Hospital Stay (HOSPITAL_BASED_OUTPATIENT_CLINIC_OR_DEPARTMENT_OTHER): Payer: Medicaid Other | Admitting: Hematology and Oncology

## 2022-04-16 ENCOUNTER — Encounter: Payer: Self-pay | Admitting: Hematology and Oncology

## 2022-04-16 VITALS — BP 118/71 | HR 80 | Temp 97.8°F | Resp 18 | Ht 62.0 in | Wt 200.0 lb

## 2022-04-16 DIAGNOSIS — Z9221 Personal history of antineoplastic chemotherapy: Secondary | ICD-10-CM | POA: Diagnosis not present

## 2022-04-16 DIAGNOSIS — Z90721 Acquired absence of ovaries, unilateral: Secondary | ICD-10-CM | POA: Insufficient documentation

## 2022-04-16 DIAGNOSIS — C562 Malignant neoplasm of left ovary: Secondary | ICD-10-CM | POA: Diagnosis not present

## 2022-04-16 DIAGNOSIS — Z8543 Personal history of malignant neoplasm of ovary: Secondary | ICD-10-CM | POA: Diagnosis not present

## 2022-04-16 DIAGNOSIS — D3911 Neoplasm of uncertain behavior of right ovary: Secondary | ICD-10-CM

## 2022-04-16 LAB — CBC WITH DIFFERENTIAL (CANCER CENTER ONLY)
Abs Immature Granulocytes: 0.02 10*3/uL (ref 0.00–0.07)
Basophils Absolute: 0.1 10*3/uL (ref 0.0–0.1)
Basophils Relative: 1 %
Eosinophils Absolute: 0.1 10*3/uL (ref 0.0–0.5)
Eosinophils Relative: 2 %
HCT: 37.5 % (ref 36.0–46.0)
Hemoglobin: 12.3 g/dL (ref 12.0–15.0)
Immature Granulocytes: 0 %
Lymphocytes Relative: 35 %
Lymphs Abs: 2.4 10*3/uL (ref 0.7–4.0)
MCH: 26.5 pg (ref 26.0–34.0)
MCHC: 32.8 g/dL (ref 30.0–36.0)
MCV: 80.6 fL (ref 80.0–100.0)
Monocytes Absolute: 0.4 10*3/uL (ref 0.1–1.0)
Monocytes Relative: 6 %
Neutro Abs: 3.9 10*3/uL (ref 1.7–7.7)
Neutrophils Relative %: 56 %
Platelet Count: 182 10*3/uL (ref 150–400)
RBC: 4.65 MIL/uL (ref 3.87–5.11)
RDW: 13.5 % (ref 11.5–15.5)
WBC Count: 6.9 10*3/uL (ref 4.0–10.5)
nRBC: 0 % (ref 0.0–0.2)

## 2022-04-16 LAB — CMP (CANCER CENTER ONLY)
ALT: 10 U/L (ref 0–44)
AST: 9 U/L — ABNORMAL LOW (ref 15–41)
Albumin: 3.7 g/dL (ref 3.5–5.0)
Alkaline Phosphatase: 71 U/L (ref 38–126)
Anion gap: 4 — ABNORMAL LOW (ref 5–15)
BUN: 9 mg/dL (ref 6–20)
CO2: 28 mmol/L (ref 22–32)
Calcium: 9 mg/dL (ref 8.9–10.3)
Chloride: 104 mmol/L (ref 98–111)
Creatinine: 0.99 mg/dL (ref 0.44–1.00)
GFR, Estimated: >60 mL/min (ref >60)
Glucose, Bld: 88 mg/dL (ref 70–99)
Potassium: 3.9 mmol/L (ref 3.5–5.1)
Sodium: 136 mmol/L (ref 135–145)
Total Bilirubin: 0.3 mg/dL (ref 0.3–1.2)
Total Protein: 6.9 g/dL (ref 6.5–8.1)

## 2022-04-16 NOTE — Assessment & Plan Note (Signed)
Her last imaging studies showed no evidence of disease She has no clinical signs or symptoms to suggest cancer recurrence She is overdue to see GYN surgeon I have sent the scheduler message to schedule an appointment for her to be evaluated I plan to see her again in 6 months

## 2022-04-16 NOTE — Progress Notes (Signed)
[facility] OFFICE PROGRESS NOTE  Patient Care Team: Pcp, No as PCP - General Lafonda Mosses, MD as Consulting Physician (Gynecologic Oncology) Dorothyann Gibbs, NP as Nurse Practitioner (Gynecologic Oncology) Heath Lark, MD as Consulting Physician (Hematology and Oncology) Harmon Pier, RN as Registered Nurse  ASSESSMENT & PLAN:  Malignant granulosa cell tumor of ovary, left [facility]) Her last imaging studies showed no evidence of disease She has no clinical signs or symptoms to suggest cancer recurrence She is overdue to see GYN surgeon I have sent the scheduler message to schedule an appointment for her to be evaluated I plan to see her again in 6 months  Orders Placed This Encounter  Procedures   CBC with Differential/Platelet    Standing Status:   Standing    Number of Occurrences:   22    Standing Expiration Date:   04/17/2023   Comprehensive metabolic panel    Standing Status:   Standing    Number of Occurrences:   33    Standing Expiration Date:   04/17/2023    All questions were answered. The patient knows to call the clinic with any problems, questions or concerns. The total time spent in the appointment was 20 minutes encounter with patients including review of chart and various tests results, discussions about plan of care and coordination of care plan   Heath Lark, MD 04/16/2022 10:36 AM  INTERVAL HISTORY: Please see below for problem oriented charting. she returns for surveillance follow-up for history of granulosa cell tumor of the ovary status post resection She is doing well The patient call several times to get appointment to see GYN surgeon but was not successful In the meantime, she is not symptomatic Denies abdominal pain, changes in bowel habits or abnormal vaginal discharge  REVIEW OF SYSTEMS:   Constitutional: Denies fevers, chills or abnormal weight loss Eyes: Denies blurriness of vision Ears, nose, mouth, throat, and face: Denies  mucositis or sore throat Respiratory: Denies cough, dyspnea or wheezes Cardiovascular: Denies palpitation, chest discomfort or lower extremity swelling Gastrointestinal:  Denies nausea, heartburn or change in bowel habits Skin: Denies abnormal skin rashes Lymphatics: Denies new lymphadenopathy or easy bruising Neurological:Denies numbness, tingling or new weaknesses Behavioral/Psych: Mood is stable, no new changes  All other systems were reviewed with the patient and are negative.  I have reviewed the past medical history, past surgical history, social history and family history with the patient and they are unchanged from previous note.  ALLERGIES:  has No Known Allergies.  MEDICATIONS:  No current outpatient medications on file.   No current facility-administered medications for this visit.    SUMMARY OF ONCOLOGIC HISTORY: Oncology History Overview Note  1/14: inhibin B - 3,543 Inhibin A - 68  Presented initially on 04/21/20 with suspected pregnancy, increasing abdominal growth. 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   Malignant granulosa cell tumor of ovary, left (HCC)  04/21/2020 Imaging   CT A/P: 1. 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 Ovarian lesion is typically a fibroma in this setting. There is a cleavage plane between the adnexal mass and the uterus, which makes a pedunculated fibroid unlikely. 2. There is a probable nabothian cyst in the cervix. 3. Ascites causes mass effect on the abdominopelvic structures   04/22/2020 Procedure   Paracentesis, 9L  FINAL MICROSCOPIC DIAGNOSIS:  - Atypical cells  present  - See comment.    04/23/2020 Procedure   Thoracentesis, 2L  FINAL MICROSCOPIC DIAGNOSIS:  - Reactive mesothelial cells present   04/27/2020 Surgery   Robotic-assisted lysis of  adhesions, left salpingoo-oophorectomy, laparotomy for specimen removal, ileocecal resection and reanastomosis, appendectomy, omentectomy  Findings: On EUA, small mobile uterus with mass that moves in junction with the uterus, sitting out of the pelvis and palpable with the abdominal hand. Mass is firm and adherent to the anterior abdominal wall. On intra-abdominal entry, approximately 6L of green-tinged ascites noted and removed. Normal appearing upper abdominal survey. Normal appearing omentum. 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 zsome pockets of degeneration that are unavoidably entered during gentle manipulation of the ovary. Mass with dense adhesions to the anterior abdominal wall on the right, the right sidewall, and several loops of ileum. Uterus 8cm and normal appearing. Right fallopian tube and ovary without evidence of disease. Some inflammatory rind noted on the small and large bowel. Given size of mass and its suspected degeneration and poor integrity, unable to place in an Endocatch bag and midline excision extended for specimen removal. Given concern for thermal damage to the terminal ileum from lyssi of dense adhesions, decision made to proceed with ileocectomy. 1-1.5cm mesenteric node palpated within the mesentery removed. Some shotty sub centimeter mesenteric nodes palpated. No para-aortic or pelvic lymphadenopathy. Liver edge, diaphragm and stomach smooth. Omentum without evidence of disease. An additional 3L of ascites was removed over the course of the surgery.   04/27/2020 Pathology Results   A. OVARY AND FALLOPIAN TUBE, LEFT, SALPINGO OOPHORECTOMY:  - Granulosa cell tumor, adult type, multiple fragments measuring 18.5 cm  in aggregate  - No evidence of ovarian surface involvement  - Benign unremarkable fallopian tube  - See oncology table   B. ILEOCECUM AND APPENDIX, RESECTION:  - Segment of terminal ileum (9 cm) and colon (10 cm)  showing focal  serosal disruption  - Unremarkable appendix  - Margins appear viable  - Benign lymph nodes  - No evidence of malignancy   C. OMENTUM, RESECTION:  - Portion of omentum with congestion and mild mesothelial hyperplasia  - No evidence of malignancy   OVARY or FALLOPIAN TUBE or PRIMARY PERITONEUM: Resection   Procedure: Salpingo-oophorectomy  Specimen Integrity: Fragmented  Tumor Site: Left ovary  Tumor Size: Multiple tissue fragments measured 18.5 cm in aggregate  Histologic Type: Granulosa cell tumor, adult type  Histologic Grade: Not applicable  Ovarian Surface Involvement: Not identified  Fallopian Tube Surface Involvement: Not identified  Implants (required for advanced stage serous/seromucinous borderline  tumors only): Not applicable  Other Tissue/ Organ Involvement: Not applicable  Largest Extrapelvic Peritoneal Focus: Not identified  Peritoneal/Ascitic Fluid Involvement: Not identified ([id number])  Chemotherapy Response Score (CRS): Not applicable, no known presurgical  therapy  Regional Lymph Nodes: Not applicable (no lymph nodes submitted or found)  Distant Metastasis:       Distant Site(s) Involved: Not applicable  Pathologic Stage Classification (pTNM, AJCC 8th Edition): pT1c, pN not  assigned (no nodes submitted or found).  See comment  Ancillary Studies: Can be performed upon request  Representative Tumor Block: A2  Comment(s): The tumor stage is due to intraoperative surgical spill of  the tumor cells based on discussion with the surgeon Dr. Jeral Pinch.   04/27/2020 Cancer Staging   Staging form: Ovary, Fallopian Tube, and Primary Peritoneal Carcinoma, AJCC 8th Edition - Clinical stage from 04/27/2020: FIGO Stage IC1, calculated as Stage  IC (cT1c1, cN0, cM0) - Signed by Lafonda Mosses, MD on 05/05/2020   05/05/2020 Initial Diagnosis   Granulosa cell tumor of ovary, right   05/30/2020 - 10/12/2020 Chemotherapy    Patient is on Treatment  Plan: OVARIAN CARBOPLATIN (AUC 6) / PACLITAXEL (175) Q21D X 6 CYCLES       06/21/2020 Tumor Marker   Patient's tumor was tested for the following markers: Inhibin B Results of the tumor marker test revealed 17.5   10/13/2020 Imaging   Negative. No evidence of recurrent or metastatic carcinoma within the abdomen or pelvis.     PHYSICAL EXAMINATION: ECOG PERFORMANCE STATUS: 0 - Asymptomatic  Vitals:   04/16/22 1017  BP: 118/71  Pulse: 80  Resp: 18  Temp: 97.8 F (36.6 C)  SpO2: 100%   Filed Weights   04/16/22 1017  Weight: 200 lb (90.7 kg)    GENERAL:alert, no distress and comfortable SKIN: skin color, texture, turgor are normal, no rashes or significant lesions EYES: normal, Conjunctiva are pink and non-injected, sclera clear OROPHARYNX:no exudate, no erythema and lips, buccal mucosa, and tongue normal  NECK: supple, thyroid normal size, non-tender, without nodularity LYMPH:  no palpable lymphadenopathy in the cervical, axillary or inguinal LUNGS: clear to auscultation and percussion with normal breathing effort HEART: regular rate & rhythm and no murmurs and no lower extremity edema ABDOMEN:abdomen soft, non-tender and normal bowel sounds Musculoskeletal:no cyanosis of digits and no clubbing  NEURO: alert & oriented x 3 with fluent speech, no focal motor/sensory deficits  LABORATORY DATA:  I have reviewed the data as listed    Component Value Date/Time   NA 136 04/16/2022 0957   K 3.9 04/16/2022 0957   CL 104 04/16/2022 0957   CO2 28 04/16/2022 0957   GLUCOSE 88 04/16/2022 0957   BUN 9 04/16/2022 0957   CREATININE 0.99 04/16/2022 0957   CALCIUM 9.0 04/16/2022 0957   PROT 6.9 04/16/2022 0957   ALBUMIN 3.7 04/16/2022 0957   AST 9 (L) 04/16/2022 0957   ALT 10 04/16/2022 0957   ALKPHOS 71 04/16/2022 0957   BILITOT 0.3 04/16/2022 0957   GFRNONAA >60 04/16/2022 0957    No results found for: "SPEP", "UPEP"  Lab Results  Component Value Date   WBC 6.9  04/16/2022   NEUTROABS 3.9 04/16/2022   HGB 12.3 04/16/2022   HCT 37.5 04/16/2022   MCV 80.6 04/16/2022   PLT 182 04/16/2022      Chemistry      Component Value Date/Time   NA 136 04/16/2022 0957   K 3.9 04/16/2022 0957   CL 104 04/16/2022 0957   CO2 28 04/16/2022 0957   BUN 9 04/16/2022 0957   CREATININE 0.99 04/16/2022 0957      Component Value Date/Time   CALCIUM 9.0 04/16/2022 0957   ALKPHOS 71 04/16/2022 0957   AST 9 (L) 04/16/2022 0957   ALT 10 04/16/2022 0957   BILITOT 0.3 04/16/2022 0957

## 2022-04-17 ENCOUNTER — Telehealth: Payer: Self-pay | Admitting: *Deleted

## 2022-04-17 NOTE — Telephone Encounter (Signed)
Per Dr Alvy Bimler, patient scheduled to see Dr Berline Lopes on 4/12

## 2022-07-17 ENCOUNTER — Encounter: Payer: Self-pay | Admitting: Gynecologic Oncology

## 2022-07-20 ENCOUNTER — Other Ambulatory Visit: Payer: Self-pay

## 2022-07-20 ENCOUNTER — Encounter: Payer: Self-pay | Admitting: Gynecologic Oncology

## 2022-07-20 ENCOUNTER — Inpatient Hospital Stay: Payer: Medicaid Other | Attending: Nurse Practitioner | Admitting: Gynecologic Oncology

## 2022-07-20 ENCOUNTER — Inpatient Hospital Stay: Payer: Medicaid Other | Admitting: Gynecologic Oncology

## 2022-07-20 VITALS — BP 124/53 | HR 73 | Temp 98.6°F | Wt 196.6 lb

## 2022-07-20 DIAGNOSIS — D3911 Neoplasm of uncertain behavior of right ovary: Secondary | ICD-10-CM

## 2022-07-20 DIAGNOSIS — Z9221 Personal history of antineoplastic chemotherapy: Secondary | ICD-10-CM | POA: Diagnosis not present

## 2022-07-20 DIAGNOSIS — Z90721 Acquired absence of ovaries, unilateral: Secondary | ICD-10-CM | POA: Insufficient documentation

## 2022-07-20 DIAGNOSIS — C562 Malignant neoplasm of left ovary: Secondary | ICD-10-CM

## 2022-07-20 DIAGNOSIS — Z8543 Personal history of malignant neoplasm of ovary: Secondary | ICD-10-CM | POA: Insufficient documentation

## 2022-07-20 LAB — CBC WITH DIFFERENTIAL/PLATELET
Abs Immature Granulocytes: 0.02 10*3/uL (ref 0.00–0.07)
Basophils Absolute: 0.1 10*3/uL (ref 0.0–0.1)
Basophils Relative: 1 %
Eosinophils Absolute: 0.2 10*3/uL (ref 0.0–0.5)
Eosinophils Relative: 3 %
HCT: 37.7 % (ref 36.0–46.0)
Hemoglobin: 12.4 g/dL (ref 12.0–15.0)
Immature Granulocytes: 0 %
Lymphocytes Relative: 31 %
Lymphs Abs: 2.1 10*3/uL (ref 0.7–4.0)
MCH: 26.4 pg (ref 26.0–34.0)
MCHC: 32.9 g/dL (ref 30.0–36.0)
MCV: 80.4 fL (ref 80.0–100.0)
Monocytes Absolute: 0.5 10*3/uL (ref 0.1–1.0)
Monocytes Relative: 7 %
Neutro Abs: 4.1 10*3/uL (ref 1.7–7.7)
Neutrophils Relative %: 58 %
Platelets: 217 10*3/uL (ref 150–400)
RBC: 4.69 MIL/uL (ref 3.87–5.11)
RDW: 13.5 % (ref 11.5–15.5)
WBC: 6.9 10*3/uL (ref 4.0–10.5)
nRBC: 0 % (ref 0.0–0.2)

## 2022-07-20 LAB — COMPREHENSIVE METABOLIC PANEL
ALT: 9 U/L (ref 0–44)
AST: 8 U/L — ABNORMAL LOW (ref 15–41)
Albumin: 3.8 g/dL (ref 3.5–5.0)
Alkaline Phosphatase: 74 U/L (ref 38–126)
Anion gap: 5 (ref 5–15)
BUN: 9 mg/dL (ref 6–20)
CO2: 28 mmol/L (ref 22–32)
Calcium: 8.9 mg/dL (ref 8.9–10.3)
Chloride: 105 mmol/L (ref 98–111)
Creatinine, Ser: 1 mg/dL (ref 0.44–1.00)
GFR, Estimated: >60 mL/min (ref >60)
Glucose, Bld: 102 mg/dL — ABNORMAL HIGH (ref 70–99)
Potassium: 3.6 mmol/L (ref 3.5–5.1)
Sodium: 138 mmol/L (ref 135–145)
Total Bilirubin: 0.4 mg/dL (ref 0.3–1.2)
Total Protein: 7.5 g/dL (ref 6.5–8.1)

## 2022-07-20 NOTE — Progress Notes (Signed)
Gynecologic Oncology Return Clinic Visit  07/20/22  Reason for Visit: follow-up  Treatment History: Oncology History Overview Note  1/14: inhibin B - 3,543 Inhibin A - 496  Presented initially on 04/21/20 with suspected pregnancy, increasing abdominal growth. 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   Malignant granulosa cell tumor of ovary, left  04/21/2020 Imaging   CT A/P: 1. 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 Ovarian lesion is typically a fibroma in this setting. There is a cleavage plane between the adnexal mass and the uterus, which makes a pedunculated fibroid unlikely. 2. There is a probable nabothian cyst in the cervix. 3. Ascites causes mass effect on the abdominopelvic structures   04/22/2020 Procedure   Paracentesis, 9L  FINAL MICROSCOPIC DIAGNOSIS:  - Atypical cells present  - See comment.    04/23/2020 Procedure   Thoracentesis, 2L  FINAL MICROSCOPIC DIAGNOSIS:  - Reactive mesothelial cells present   04/27/2020 Surgery   Robotic-assisted lysis of adhesions, left salpingoo-oophorectomy, laparotomy for specimen removal, ileocecal resection and reanastomosis, appendectomy, omentectomy  Findings: On EUA, small mobile uterus with mass that moves in junction with the uterus, sitting out of the pelvis and palpable with the abdominal hand. Mass is firm and adherent to the anterior abdominal wall. On intra-abdominal entry, approximately 6L of green-tinged ascites noted and removed. Normal appearing upper abdominal survey. Normal appearing omentum. 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 zsome pockets of degeneration that are unavoidably entered during gentle manipulation of the ovary. Mass with dense adhesions to the anterior abdominal wall on the  right, the right sidewall, and several loops of ileum. Uterus 8cm and normal appearing. Right fallopian tube and ovary without evidence of disease. Some inflammatory rind noted on the small and large bowel. Given size of mass and its suspected degeneration and poor integrity, unable to place in an Endocatch bag and midline excision extended for specimen removal. Given concern for thermal damage to the terminal ileum from lyssi of dense adhesions, decision made to proceed with ileocectomy. 1-1.5cm mesenteric node palpated within the mesentery removed. Some shotty sub centimeter mesenteric nodes palpated. No para-aortic or pelvic lymphadenopathy. Liver edge, diaphragm and stomach smooth. Omentum without evidence of disease. An additional 3L of ascites was removed over the course of the surgery.   04/27/2020 Pathology Results   A. OVARY AND FALLOPIAN TUBE, LEFT, SALPINGO OOPHORECTOMY:  - Granulosa cell tumor, adult type, multiple fragments measuring 18.5 cm  in aggregate  - No evidence of ovarian surface involvement  - Benign unremarkable fallopian tube  - See oncology table   B. ILEOCECUM AND APPENDIX, RESECTION:  - Segment of terminal ileum (9 cm) and colon (10 cm) showing focal  serosal disruption  - Unremarkable appendix  - Margins appear viable  - Benign lymph nodes  - No evidence of malignancy   C. OMENTUM, RESECTION:  - Portion of omentum with congestion and mild mesothelial hyperplasia  - No evidence of malignancy   OVARY or FALLOPIAN TUBE or PRIMARY PERITONEUM: Resection   Procedure: Salpingo-oophorectomy  Specimen Integrity: Fragmented  Tumor Site: Left ovary  Tumor Size: Multiple tissue fragments measured 18.5 cm in aggregate  Histologic Type: Granulosa cell tumor, adult type  Histologic Grade: Not applicable  Ovarian Surface Involvement: Not identified  Fallopian Tube Surface Involvement: Not identified  Implants (required for advanced stage serous/seromucinous borderline  tumors only): Not applicable  Other Tissue/ Organ Involvement: Not applicable  Largest Extrapelvic Peritoneal Focus: Not identified  Peritoneal/Ascitic Fluid Involvement: Not identified ([id number])  Chemotherapy Response Score (CRS): Not applicable, no known presurgical  therapy  Regional Lymph Nodes: Not applicable (no lymph nodes submitted or found)  Distant Metastasis:       Distant Site(s) Involved: Not applicable  Pathologic Stage Classification (pTNM, AJCC 8th Edition): pT1c, pN not  assigned (no nodes submitted or found).  See comment  Ancillary Studies: Can be performed upon request  Representative Tumor Block: A2  Comment(s): The tumor stage is due to intraoperative surgical spill of  the tumor cells based on discussion with the surgeon Dr. Eugene Garnet.   04/27/2020 Cancer Staging   Staging form: Ovary, Fallopian Tube, and Primary Peritoneal Carcinoma, AJCC 8th Edition - Clinical stage from 04/27/2020: FIGO Stage IC1, calculated as Stage IC (cT1c1, cN0, cM0) - Signed by Carver Fila, MD on 05/05/2020   05/05/2020 Initial Diagnosis   Granulosa cell tumor of ovary, right   05/30/2020 - 10/12/2020 Chemotherapy    Patient is on Treatment Plan: OVARIAN CARBOPLATIN (AUC 6) / PACLITAXEL (175) Q21D X 6 CYCLES       06/21/2020 Tumor Marker   Patient's tumor was tested for the following markers: Inhibin B Results of the tumor marker test revealed 17.5   10/13/2020 Imaging   Negative. No evidence of recurrent or metastatic carcinoma within the abdomen or pelvis.     Interval History: Patient reports overall well.  She has made some changes to her diet and has been exercising regularly.  She is very happy with the weight loss that she has been able to achieve.  She denies any changes to bowel or bladder function.  She reports menses have been regular, denies any intermenstrual bleeding.  Denies any pelvic or abdominal pain.  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   Ascites    Medical history non-contributory    Pelvic mass     Past Surgical History:  Procedure Laterality Date   IR PARACENTESIS  04/22/2020   LAPAROTOMY N/A 04/27/2020   Procedure: MINI LAPAROTOMY, ILEOCECAL RESECTION, APPENDECTOMY;  Surgeon: Carver Fila, MD;  Location: [facility];  Service: Gynecology;  Laterality: N/A;   ROBOTIC ASSISTED SALPINGO OOPHERECTOMY Right 04/27/2020   Procedure: XI ROBOTIC ASSISTED UNITLATERAL SALPINGO OOPHORECTOMY;  Surgeon: Carver Fila, MD;  Location: [facility];  Service: Gynecology;  Laterality: Right;   THORACENTESIS     WISDOM TOOTH EXTRACTION      Family History  Problem Relation Age of Onset   Diabetes Mother    Diabetes Father    Seizures Sister    Myasthenia gravis Sister    ADD / ADHD Sister    Ovarian cancer Neg Hx    Uterine cancer Neg Hx    Breast cancer Neg Hx    Colon cancer Neg Hx     Social History   Socioeconomic History   Marital status: Single    Spouse name: Not on file   Number of children: 1   Years of education: Not on file   Highest education level: Not on file  Occupational History    Employer: [employer]  Tobacco Use   Smoking status: Never   Smokeless tobacco: Never  Vaping Use   Vaping Use: Never used  Substance and Sexual Activity   Alcohol use: No   Drug use: No  Sexual activity: Yes    Birth control/protection: None  Other Topics Concern   Not on file  Social History Narrative   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)   Social Determinants of Health   Financial Resource Strain: Low Risk  (01/31/2021)   Overall Financial Resource Strain (CARDIA)    Difficulty of Paying Living Expenses: Not very hard  Food Insecurity: No Food Insecurity (01/31/2021)   Hunger Vital Sign    Worried About Running Out of Food in the Last Year: Never true    Ran Out of Food in the Last Year: Never true  Transportation Needs: No Transportation Needs  (01/31/2021)   PRAPARE - Administrator, Civil Service (Medical): No    Lack of Transportation (Non-Medical): No  Physical Activity: Sufficiently Active (01/31/2021)   Exercise Vital Sign    Days of Exercise per Week: 4 days    Minutes of Exercise per Session: 50 min  Stress: No Stress Concern Present (01/31/2021)   [employer] of Occupational Health - Occupational Stress Questionnaire    Feeling of Stress : Not at all  Social Connections: Moderately Isolated (01/31/2021)   Social Connection and Isolation Panel [NHANES]    Frequency of Communication with Friends and Family: More than three times a week    Frequency of Social Gatherings with Friends and Family: More than three times a week    Attends Religious Services: Never    Database administrator or Organizations: No    Attends Engineer, structural: 1 to 4 times per year    Marital Status: Never married    Current Medications: No current outpatient medications on file.  Review of Systems: Denies appetite changes, fevers, chills, fatigue, unexplained weight changes. Denies hearing loss, neck lumps or masses, mouth sores, ringing in ears or voice changes. Denies cough or wheezing.  Denies shortness of breath. Denies chest pain or palpitations. Denies leg swelling. Denies abdominal distention, pain, blood in stools, constipation, diarrhea, nausea, vomiting, or early satiety. Denies pain with intercourse, dysuria, frequency, hematuria or incontinence. Denies hot flashes, pelvic pain, vaginal bleeding or vaginal discharge.   Denies joint pain, back pain or muscle pain/cramps. Denies itching, rash, or wounds. Denies dizziness, headaches, numbness or seizures. Denies swollen lymph nodes or glands, denies easy bruising or bleeding. Denies anxiety, depression, confusion, or decreased concentration.  Physical Exam: BP (!) 124/53 (BP Location: Right Arm, Patient Position: Sitting)   Pulse 73   Temp 98.6 F  (37 C)   Wt 196 lb 9.6 oz (89.2 kg)   SpO2 100%   BMI 35.96 kg/m  General: Alert, oriented, no acute distress. HEENT: Normocephalic, atraumatic, sclera anicteric. Chest: Clear to auscultation bilaterally.  No wheezes or rhonchi. Cardiovascular: Regular rate and rhythm, no murmurs. Abdomen: obese, soft, nontender.  Normoactive bowel sounds.  No masses or hepatosplenomegaly appreciated.  Well-healed scars. Extremities: Grossly normal range of motion.  Warm, well perfused.  No edema bilaterally. Skin: No rashes or lesions noted. Lymphatics: No cervical, supraclavicular, or inguinal adenopathy. GU: Normal appearing external genitalia without erythema, excoriation, or lesions.  Speculum exam reveals well rugated vaginal mucosa, no lesions or masses noted.  Cervix normal in appearance.  Bimanual exam reveals mobile uterus, no adnexal masses, nodularity.  Laboratory & Radiologic Studies:          Component Ref Range & Units 9 mo ago (10/12/21) 1 yr ago (07/17/21) 1 yr ago (04/14/21) 1 yr ago (08/22/20) 1 yr ago (08/01/20) 2 yr ago (06/20/20)  2 yr ago (05/30/20)  Inhibin B pg/mL 69.7 <7.0 CM 80.4 CM 132.7 CM 187.9 CM 17.5 CM 82.3 C      Assessment & Plan: Brandy Knight is a 32 y.o. 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  Finished adjuvant chemotherapy in 10/2020.   Patient is overall doing very well and is NED on exam today.  We will plan to repeat an inhibin B today as this was a tumor marker for her prior to surgery.    Patient was congratulated on her weight loss.  She is down another 4 pounds from her last visit.   We will continue with surveillance visits alternating between my office and medical oncology.  She will follow-up in 3 months with Dr. Bertis Ruddy and will come back to see me in 6.  We will get imaging if elevation of her tumor marker or symptoms suggest possible recurrence.  She and I discussed signs and symptoms that would be  concerning for recurrence today including abnormal uterine bleeding.  She knows to call the clinic if she develops any of these before her next scheduled visit.  20 minutes of total time was spent for this patient encounter, including preparation, face-to-face counseling with the patient and coordination of care, and documentation of the encounter.  Eugene Garnet, MD  Division of Gynecologic Oncology  Department of Obstetrics and Gynecology  [facility]

## 2022-07-20 NOTE — Patient Instructions (Signed)
It was good to see you today.  I do not see or feel any evidence of cancer recurrence on your exam.  I will see you for follow-up in 6 months.  As always, if you develop any new and concerning symptoms before your next visit, please call to see me sooner.   

## 2022-07-23 LAB — INHIBIN B: Inhibin B: 163.9 pg/mL

## 2022-10-16 ENCOUNTER — Inpatient Hospital Stay: Payer: Medicaid Other | Attending: Nurse Practitioner

## 2022-10-16 ENCOUNTER — Other Ambulatory Visit: Payer: Self-pay

## 2022-10-16 ENCOUNTER — Inpatient Hospital Stay: Payer: Medicaid Other | Admitting: Hematology and Oncology

## 2022-10-16 ENCOUNTER — Encounter: Payer: Self-pay | Admitting: Hematology and Oncology

## 2022-10-16 VITALS — BP 115/72 | HR 69 | Temp 97.8°F | Resp 18 | Ht 62.0 in | Wt 202.2 lb

## 2022-10-16 DIAGNOSIS — Z8543 Personal history of malignant neoplasm of ovary: Secondary | ICD-10-CM | POA: Diagnosis present

## 2022-10-16 DIAGNOSIS — C562 Malignant neoplasm of left ovary: Secondary | ICD-10-CM

## 2022-10-16 DIAGNOSIS — E669 Obesity, unspecified: Secondary | ICD-10-CM | POA: Insufficient documentation

## 2022-10-16 DIAGNOSIS — D3911 Neoplasm of uncertain behavior of right ovary: Secondary | ICD-10-CM | POA: Diagnosis not present

## 2022-10-16 DIAGNOSIS — Z9221 Personal history of antineoplastic chemotherapy: Secondary | ICD-10-CM | POA: Diagnosis not present

## 2022-10-16 LAB — CBC WITH DIFFERENTIAL/PLATELET
Abs Immature Granulocytes: 0.02 10*3/uL (ref 0.00–0.07)
Basophils Absolute: 0.1 10*3/uL (ref 0.0–0.1)
Basophils Relative: 1 %
Eosinophils Absolute: 0.3 10*3/uL (ref 0.0–0.5)
Eosinophils Relative: 4 %
HCT: 34.2 % — ABNORMAL LOW (ref 36.0–46.0)
Hemoglobin: 11.1 g/dL — ABNORMAL LOW (ref 12.0–15.0)
Immature Granulocytes: 0 %
Lymphocytes Relative: 39 %
Lymphs Abs: 2.8 10*3/uL (ref 0.7–4.0)
MCH: 26.7 pg (ref 26.0–34.0)
MCHC: 32.5 g/dL (ref 30.0–36.0)
MCV: 82.2 fL (ref 80.0–100.0)
Monocytes Absolute: 0.4 10*3/uL (ref 0.1–1.0)
Monocytes Relative: 6 %
Neutro Abs: 3.6 10*3/uL (ref 1.7–7.7)
Neutrophils Relative %: 50 %
Platelets: 187 10*3/uL (ref 150–400)
RBC: 4.16 MIL/uL (ref 3.87–5.11)
RDW: 14.2 % (ref 11.5–15.5)
WBC: 7.1 10*3/uL (ref 4.0–10.5)
nRBC: 0 % (ref 0.0–0.2)

## 2022-10-16 LAB — COMPREHENSIVE METABOLIC PANEL
ALT: 9 U/L (ref 0–44)
AST: 9 U/L — ABNORMAL LOW (ref 15–41)
Albumin: 3.5 g/dL (ref 3.5–5.0)
Alkaline Phosphatase: 63 U/L (ref 38–126)
Anion gap: 6 (ref 5–15)
BUN: 8 mg/dL (ref 6–20)
CO2: 27 mmol/L (ref 22–32)
Calcium: 8.6 mg/dL — ABNORMAL LOW (ref 8.9–10.3)
Chloride: 106 mmol/L (ref 98–111)
Creatinine, Ser: 0.99 mg/dL (ref 0.44–1.00)
GFR, Estimated: >60 mL/min (ref >60)
Glucose, Bld: 90 mg/dL (ref 70–99)
Potassium: 3.4 mmol/L — ABNORMAL LOW (ref 3.5–5.1)
Sodium: 139 mmol/L (ref 135–145)
Total Bilirubin: 0.2 mg/dL — ABNORMAL LOW (ref 0.3–1.2)
Total Protein: 7.1 g/dL (ref 6.5–8.1)

## 2022-10-16 NOTE — Assessment & Plan Note (Signed)
She has progressive weight gain since completion of treatment The patient identified bad habits such as drinking a lot of soda We discussed importance of cutting out soda or unhealthy dietary choices The patient is motivated with exercise and weight loss and is hopeful that when I see her next year, she will be at a healthier weight range

## 2022-10-16 NOTE — Progress Notes (Signed)
[facility] OFFICE PROGRESS NOTE  Patient Care Team: Pcp, No as PCP - General Carver Fila, MD as Consulting Physician (Gynecologic Oncology) Doylene Bode, NP as Nurse Practitioner (Gynecologic Oncology) Artis Delay, MD as Consulting Physician (Hematology and Oncology) Maryclare Labrador, RN as Registered Nurse  ASSESSMENT & PLAN:  Malignant granulosa cell tumor of ovary, left [facility]) Her last imaging studies showed no evidence of disease She has no clinical signs or symptoms to suggest cancer recurrence I plan to see her again in 12 months  Obesity, Class II, BMI 35-39.9 She has progressive weight gain since completion of treatment The patient identified bad habits such as drinking a lot of soda We discussed importance of cutting out soda or unhealthy dietary choices The patient is motivated with exercise and weight loss and is hopeful that when I see her next year, she will be at a healthier weight range   Orders Placed This Encounter  Procedures   Inhibin B    Standing Status:   Standing    Number of Occurrences:   9    Standing Expiration Date:   10/16/2023    All questions were answered. The patient knows to call the clinic with any problems, questions or concerns. The total time spent in the appointment was 20 minutes encounter with patients including review of chart and various tests results, discussions about plan of care and coordination of care plan   Artis Delay, MD 10/16/2022 10:36 AM  INTERVAL HISTORY: Please see below for problem oriented charting. she returns for surveillance follow-up She is feeling well I have noticed quite a bit of weight gain She is attempting to lose some weight She denies abdominal pain, bloating or changes in bowel habits She feels healthy  REVIEW OF SYSTEMS:   Constitutional: Denies fevers, chills or abnormal weight loss Eyes: Denies blurriness of vision Ears, nose, mouth, throat, and face: Denies mucositis or sore  throat Respiratory: Denies cough, dyspnea or wheezes Cardiovascular: Denies palpitation, chest discomfort or lower extremity swelling Gastrointestinal:  Denies nausea, heartburn or change in bowel habits Skin: Denies abnormal skin rashes Lymphatics: Denies new lymphadenopathy or easy bruising Neurological:Denies numbness, tingling or new weaknesses Behavioral/Psych: Mood is stable, no new changes  All other systems were reviewed with the patient and are negative.  I have reviewed the past medical history, past surgical history, social history and family history with the patient and they are unchanged from previous note.  ALLERGIES:  has No Known Allergies.  MEDICATIONS:  No current outpatient medications on file.   No current facility-administered medications for this visit.    SUMMARY OF ONCOLOGIC HISTORY: Oncology History Overview Note  1/14: inhibin B - 3,543 Inhibin A - 496  Presented initially on 04/21/20 with suspected pregnancy, increasing abdominal growth. 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   Malignant granulosa cell tumor of ovary, left (HCC)  04/21/2020 Imaging   CT A/P: 1. 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 Ovarian lesion is typically a fibroma in this setting. There is a cleavage plane between the adnexal mass and the uterus, which makes a pedunculated fibroid unlikely. 2. There is a probable nabothian cyst in the cervix. 3. Ascites causes mass effect on the abdominopelvic structures   04/22/2020 Procedure   Paracentesis, 9L  FINAL MICROSCOPIC DIAGNOSIS:  - Atypical cells present  - See comment.  04/23/2020 Procedure   Thoracentesis, 2L  FINAL MICROSCOPIC DIAGNOSIS:  - Reactive mesothelial cells present   04/27/2020 Surgery   Robotic-assisted lysis of adhesions, left  salpingoo-oophorectomy, laparotomy for specimen removal, ileocecal resection and reanastomosis, appendectomy, omentectomy  Findings: On EUA, small mobile uterus with mass that moves in junction with the uterus, sitting out of the pelvis and palpable with the abdominal hand. Mass is firm and adherent to the anterior abdominal wall. On intra-abdominal entry, approximately 6L of green-tinged ascites noted and removed. Normal appearing upper abdominal survey. Normal appearing omentum. 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 zsome pockets of degeneration that are unavoidably entered during gentle manipulation of the ovary. Mass with dense adhesions to the anterior abdominal wall on the right, the right sidewall, and several loops of ileum. Uterus 8cm and normal appearing. Right fallopian tube and ovary without evidence of disease. Some inflammatory rind noted on the small and large bowel. Given size of mass and its suspected degeneration and poor integrity, unable to place in an Endocatch bag and midline excision extended for specimen removal. Given concern for thermal damage to the terminal ileum from lyssi of dense adhesions, decision made to proceed with ileocectomy. 1-1.5cm mesenteric node palpated within the mesentery removed. Some shotty sub centimeter mesenteric nodes palpated. No para-aortic or pelvic lymphadenopathy. Liver edge, diaphragm and stomach smooth. Omentum without evidence of disease. An additional 3L of ascites was removed over the course of the surgery.   04/27/2020 Pathology Results   A. OVARY AND FALLOPIAN TUBE, LEFT, SALPINGO OOPHORECTOMY:  - Granulosa cell tumor, adult type, multiple fragments measuring 18.5 cm  in aggregate  - No evidence of ovarian surface involvement  - Benign unremarkable fallopian tube  - See oncology table   B. ILEOCECUM AND APPENDIX, RESECTION:  - Segment of terminal ileum (9 cm) and colon (10 cm) showing focal   serosal disruption  - Unremarkable appendix  - Margins appear viable  - Benign lymph nodes  - No evidence of malignancy   C. OMENTUM, RESECTION:  - Portion of omentum with congestion and mild mesothelial hyperplasia  - No evidence of malignancy   OVARY or FALLOPIAN TUBE or PRIMARY PERITONEUM: Resection   Procedure: Salpingo-oophorectomy  Specimen Integrity: Fragmented  Tumor Site: Left ovary  Tumor Size: Multiple tissue fragments measured 18.5 cm in aggregate  Histologic Type: Granulosa cell tumor, adult type  Histologic Grade: Not applicable  Ovarian Surface Involvement: Not identified  Fallopian Tube Surface Involvement: Not identified  Implants (required for advanced stage serous/seromucinous borderline  tumors only): Not applicable  Other Tissue/ Organ Involvement: Not applicable  Largest Extrapelvic Peritoneal Focus: Not identified  Peritoneal/Ascitic Fluid Involvement: Not identified ([id number])  Chemotherapy Response Score (CRS): Not applicable, no known presurgical  therapy  Regional Lymph Nodes: Not applicable (no lymph nodes submitted or found)  Distant Metastasis:       Distant Site(s) Involved: Not applicable  Pathologic Stage Classification (pTNM, AJCC 8th Edition): pT1c, pN not  assigned (no nodes submitted or found).  See comment  Ancillary Studies: Can be performed upon request  Representative Tumor Block: A2  Comment(s): The tumor stage is due to intraoperative surgical spill of  the tumor cells based on discussion with the surgeon Dr. Eugene Garnet.   04/27/2020 Cancer Staging   Staging form: Ovary, Fallopian Tube, and Primary Peritoneal Carcinoma, AJCC 8th Edition - Clinical stage from 04/27/2020: FIGO Stage IC1, calculated as Stage IC (cT1c1, cN0, cM0) - Signed by Pricilla Holm,  Carmelina Peal, MD on 05/05/2020   05/05/2020 Initial Diagnosis   Granulosa cell tumor of ovary, right   05/30/2020 - 10/12/2020 Chemotherapy    Patient is on Treatment Plan: OVARIAN  CARBOPLATIN (AUC 6) / PACLITAXEL (175) Q21D X 6 CYCLES       06/21/2020 Tumor Marker   Patient's tumor was tested for the following markers: Inhibin B Results of the tumor marker test revealed 17.5   10/13/2020 Imaging   Negative. No evidence of recurrent or metastatic carcinoma within the abdomen or pelvis.     PHYSICAL EXAMINATION: ECOG PERFORMANCE STATUS: 0 - Asymptomatic  There were no vitals filed for this visit. There were no vitals filed for this visit.  GENERAL:alert, no distress and comfortable SKIN: skin color, texture, turgor are normal, no rashes or significant lesions EYES: normal, Conjunctiva are pink and non-injected, sclera clear OROPHARYNX:no exudate, no erythema and lips, buccal mucosa, and tongue normal  NECK: supple, thyroid normal size, non-tender, without nodularity LYMPH:  no palpable lymphadenopathy in the cervical, axillary or inguinal LUNGS: clear to auscultation and percussion with normal breathing effort HEART: regular rate & rhythm and no murmurs and no lower extremity edema ABDOMEN:abdomen soft, non-tender and normal bowel sounds Musculoskeletal:no cyanosis of digits and no clubbing  NEURO: alert & oriented x 3 with fluent speech, no focal motor/sensory deficits  LABORATORY DATA:  I have reviewed the data as listed    Component Value Date/Time   NA 138 07/20/2022 1346   K 3.6 07/20/2022 1346   CL 105 07/20/2022 1346   CO2 28 07/20/2022 1346   GLUCOSE 102 (H) 07/20/2022 1346   BUN 9 07/20/2022 1346   CREATININE 1.00 07/20/2022 1346   CREATININE 0.99 04/16/2022 0957   CALCIUM 8.9 07/20/2022 1346   PROT 7.5 07/20/2022 1346   ALBUMIN 3.8 07/20/2022 1346   AST 8 (L) 07/20/2022 1346   AST 9 (L) 04/16/2022 0957   ALT 9 07/20/2022 1346   ALT 10 04/16/2022 0957   ALKPHOS 74 07/20/2022 1346   BILITOT 0.4 07/20/2022 1346   BILITOT 0.3 04/16/2022 0957   GFRNONAA >60 07/20/2022 1346   GFRNONAA >60 04/16/2022 0957    No results found for: "SPEP",  "UPEP"  Lab Results  Component Value Date   WBC 7.1 10/16/2022   NEUTROABS 3.6 10/16/2022   HGB 11.1 (L) 10/16/2022   HCT 34.2 (L) 10/16/2022   MCV 82.2 10/16/2022   PLT 187 10/16/2022      Chemistry      Component Value Date/Time   NA 138 07/20/2022 1346   K 3.6 07/20/2022 1346   CL 105 07/20/2022 1346   CO2 28 07/20/2022 1346   BUN 9 07/20/2022 1346   CREATININE 1.00 07/20/2022 1346   CREATININE 0.99 04/16/2022 0957      Component Value Date/Time   CALCIUM 8.9 07/20/2022 1346   ALKPHOS 74 07/20/2022 1346   AST 8 (L) 07/20/2022 1346   AST 9 (L) 04/16/2022 0957   ALT 9 07/20/2022 1346   ALT 10 04/16/2022 0957   BILITOT 0.4 07/20/2022 1346   BILITOT 0.3 04/16/2022 0957

## 2022-10-16 NOTE — Assessment & Plan Note (Signed)
Her last imaging studies showed no evidence of disease She has no clinical signs or symptoms to suggest cancer recurrence I plan to see her again in 12 months

## 2023-01-25 ENCOUNTER — Inpatient Hospital Stay: Payer: Medicaid Other | Admitting: Gynecologic Oncology

## 2023-01-25 ENCOUNTER — Inpatient Hospital Stay: Payer: Medicaid Other | Attending: Nurse Practitioner | Admitting: Gynecologic Oncology

## 2023-01-25 ENCOUNTER — Encounter: Payer: Self-pay | Admitting: Gynecologic Oncology

## 2023-01-25 VITALS — BP 118/64 | HR 68 | Temp 98.5°F | Resp 20 | Wt 199.0 lb

## 2023-01-25 DIAGNOSIS — Z9221 Personal history of antineoplastic chemotherapy: Secondary | ICD-10-CM | POA: Insufficient documentation

## 2023-01-25 DIAGNOSIS — D3911 Neoplasm of uncertain behavior of right ovary: Secondary | ICD-10-CM

## 2023-01-25 DIAGNOSIS — R978 Other abnormal tumor markers: Secondary | ICD-10-CM | POA: Insufficient documentation

## 2023-01-25 DIAGNOSIS — Z8543 Personal history of malignant neoplasm of ovary: Secondary | ICD-10-CM | POA: Diagnosis not present

## 2023-01-25 DIAGNOSIS — E669 Obesity, unspecified: Secondary | ICD-10-CM

## 2023-01-25 NOTE — Patient Instructions (Signed)
It was good to see you today.  I do not see or feel any evidence of cancer recurrence on your exam.  I will see you for follow-up in February.  I will let you know when I get your lab test back next week.  As always, if you develop any new and concerning symptoms before your next visit, please call to see me sooner.

## 2023-01-25 NOTE — Progress Notes (Signed)
Gynecologic Oncology Return Clinic Visit  01/25/23  Reason for Visit: surveillance  Treatment History: Oncology History Overview Note  1/14: inhibin B - 3,543 Inhibin A - 496  Presented initially on 04/21/20 with suspected pregnancy, increasing abdominal growth. 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   Malignant granulosa cell tumor of ovary, left (HCC)  04/21/2020 Imaging   CT A/P: 1. 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 Ovarian lesion is typically a fibroma in this setting. There is a cleavage plane between the adnexal mass and the uterus, which makes a pedunculated fibroid unlikely. 2. There is a probable nabothian cyst in the cervix. 3. Ascites causes mass effect on the abdominopelvic structures   04/22/2020 Procedure   Paracentesis, 9L  FINAL MICROSCOPIC DIAGNOSIS:  - Atypical cells present  - See comment.    04/23/2020 Procedure   Thoracentesis, 2L  FINAL MICROSCOPIC DIAGNOSIS:  - Reactive mesothelial cells present   04/27/2020 Surgery   Robotic-assisted lysis of adhesions, left salpingoo-oophorectomy, laparotomy for specimen removal, ileocecal resection and reanastomosis, appendectomy, omentectomy  Findings: On EUA, small mobile uterus with mass that moves in junction with the uterus, sitting out of the pelvis and palpable with the abdominal hand. Mass is firm and adherent to the anterior abdominal wall. On intra-abdominal entry, approximately 6L of green-tinged ascites noted and removed. Normal appearing upper abdominal survey. Normal appearing omentum. 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 zsome pockets of degeneration that are unavoidably entered during gentle manipulation of the ovary. Mass with dense adhesions to the anterior abdominal wall  on the right, the right sidewall, and several loops of ileum. Uterus 8cm and normal appearing. Right fallopian tube and ovary without evidence of disease. Some inflammatory rind noted on the small and large bowel. Given size of mass and its suspected degeneration and poor integrity, unable to place in an Endocatch bag and midline excision extended for specimen removal. Given concern for thermal damage to the terminal ileum from lyssi of dense adhesions, decision made to proceed with ileocectomy. 1-1.5cm mesenteric node palpated within the mesentery removed. Some shotty sub centimeter mesenteric nodes palpated. No para-aortic or pelvic lymphadenopathy. Liver edge, diaphragm and stomach smooth. Omentum without evidence of disease. An additional 3L of ascites was removed over the course of the surgery.   04/27/2020 Pathology Results   A. OVARY AND FALLOPIAN TUBE, LEFT, SALPINGO OOPHORECTOMY:  - Granulosa cell tumor, adult type, multiple fragments measuring 18.5 cm  in aggregate  - No evidence of ovarian surface involvement  - Benign unremarkable fallopian tube  - See oncology table   B. ILEOCECUM AND APPENDIX, RESECTION:  - Segment of terminal ileum (9 cm) and colon (10 cm) showing focal  serosal disruption  - Unremarkable appendix  - Margins appear viable  - Benign lymph nodes  - No evidence of malignancy   C. OMENTUM, RESECTION:  - Portion of omentum with congestion and mild mesothelial hyperplasia  - No evidence of malignancy   OVARY or FALLOPIAN TUBE or PRIMARY PERITONEUM: Resection   Procedure: Salpingo-oophorectomy  Specimen Integrity: Fragmented  Tumor Site: Left ovary  Tumor Size: Multiple tissue fragments measured 18.5 cm in aggregate  Histologic Type: Granulosa cell tumor, adult type  Histologic Grade: Not applicable  Ovarian Surface Involvement: Not identified  Fallopian Tube Surface Involvement: Not identified  Implants (required for advanced stage serous/seromucinous  borderline  tumors only): Not applicable  Other Tissue/ Organ Involvement: Not applicable  Largest Extrapelvic Peritoneal Focus: Not identified  Peritoneal/Ascitic Fluid Involvement: Not identified ([id number])  Chemotherapy Response Score (CRS): Not applicable, no known presurgical  therapy  Regional Lymph Nodes: Not applicable (no lymph nodes submitted or found)  Distant Metastasis:       Distant Site(s) Involved: Not applicable  Pathologic Stage Classification (pTNM, AJCC 8th Edition): pT1c, pN not  assigned (no nodes submitted or found).  See comment  Ancillary Studies: Can be performed upon request  Representative Tumor Block: A2  Comment(s): The tumor stage is due to intraoperative surgical spill of  the tumor cells based on discussion with the surgeon Dr. Eugene Garnet.   04/27/2020 Cancer Staging   Staging form: Ovary, Fallopian Tube, and Primary Peritoneal Carcinoma, AJCC 8th Edition - Clinical stage from 04/27/2020: FIGO Stage IC1, calculated as Stage IC (cT1c1, cN0, cM0) - Signed by Carver Fila, MD on 05/05/2020   05/05/2020 Initial Diagnosis   Granulosa cell tumor of ovary, right   05/30/2020 - 10/12/2020 Chemotherapy    Patient is on Treatment Plan: OVARIAN CARBOPLATIN (AUC 6) / PACLITAXEL (175) Q21D X 6 CYCLES       06/21/2020 Tumor Marker   Patient's tumor was tested for the following markers: Inhibin B Results of the tumor marker test revealed 17.5   10/13/2020 Imaging   Negative. No evidence of recurrent or metastatic carcinoma within the abdomen or pelvis.     Interval History: Patient reports overall doing well.  She continues to have regular monthly menses, notes bleeding is a little bit lighter than it used to be.  Denies any intermenstrual bleeding.  She reports baseline bowel bladder function.  She is working on losing weight, cutting back on sodas.  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  She reports having a negative test of cure.  Denies any discharge.  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   Ascites    Medical history non-contributory    Pelvic mass     Past Surgical History:  Procedure Laterality Date   IR PARACENTESIS  04/22/2020   LAPAROTOMY N/A 04/27/2020   Procedure: MINI LAPAROTOMY, ILEOCECAL RESECTION, APPENDECTOMY;  Surgeon: Carver Fila, MD;  Location: [facility];  Service: Gynecology;  Laterality: N/A;   ROBOTIC ASSISTED SALPINGO OOPHERECTOMY Right 04/27/2020   Procedure: XI ROBOTIC ASSISTED UNITLATERAL SALPINGO OOPHORECTOMY;  Surgeon: Carver Fila, MD;  Location: [facility];  Service: Gynecology;  Laterality: Right;   THORACENTESIS     WISDOM TOOTH EXTRACTION      Family History  Problem Relation Age of Onset   Diabetes Mother    Diabetes Father    Seizures Sister    Myasthenia gravis Sister    ADD / ADHD Sister    Ovarian cancer Neg Hx    Uterine cancer Neg Hx    Breast cancer Neg Hx    Colon cancer Neg Hx     Social History   Socioeconomic History   Marital status: Single    Spouse name: Not on file   Number of children: 1   Years of education: Not on file   Highest education level: Not on file  Occupational History    Employer: [employer]  Tobacco Use   Smoking status: Never   Smokeless tobacco: Never  Vaping Use  Vaping status: Never Used  Substance and Sexual Activity   Alcohol use: No   Drug use: No   Sexual activity: Yes    Birth control/protection: None  Other Topics Concern   Not on file  Social History Narrative   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)   Social Determinants of Health   Financial Resource Strain: Low Risk  (01/31/2021)   Overall Financial Resource Strain (CARDIA)    Difficulty of Paying Living Expenses: Not very hard  Food Insecurity: No Food Insecurity (01/31/2021)   Hunger Vital Sign    Worried About Running Out of  Food in the Last Year: Never true    Ran Out of Food in the Last Year: Never true  Transportation Needs: No Transportation Needs (01/31/2021)   PRAPARE - Administrator, Civil Service (Medical): No    Lack of Transportation (Non-Medical): No  Physical Activity: Sufficiently Active (01/31/2021)   Exercise Vital Sign    Days of Exercise per Week: 4 days    Minutes of Exercise per Session: 50 min  Stress: No Stress Concern Present (01/31/2021)   [employer] of Occupational Health - Occupational Stress Questionnaire    Feeling of Stress : Not at all  Social Connections: Moderately Isolated (01/31/2021)   Social Connection and Isolation Panel [NHANES]    Frequency of Communication with Friends and Family: More than three times a week    Frequency of Social Gatherings with Friends and Family: More than three times a week    Attends Religious Services: Never    Database administrator or Organizations: No    Attends Engineer, structural: 1 to 4 times per year    Marital Status: Never married    Current Medications: No current outpatient medications on file.  Review of Systems: Denies appetite changes, fevers, chills, fatigue, unexplained weight changes. Denies hearing loss, neck lumps or masses, mouth sores, ringing in ears or voice changes. Denies cough or wheezing.  Denies shortness of breath. Denies chest pain or palpitations. Denies leg swelling. Denies abdominal distention, pain, blood in stools, constipation, diarrhea, nausea, vomiting, or early satiety. Denies pain with intercourse, dysuria, frequency, hematuria or incontinence. Denies hot flashes, pelvic pain, vaginal bleeding or vaginal discharge.   Denies joint pain, back pain or muscle pain/cramps. Denies itching, rash, or wounds. Denies dizziness, headaches, numbness or seizures. Denies swollen lymph nodes or glands, denies easy bruising or bleeding. Denies anxiety, depression, confusion, or  decreased concentration.  Physical Exam: BP 118/64 (BP Location: Left Arm, Patient Position: Sitting)   Pulse 68   Temp 98.5 F (36.9 C) (Oral)   Resp 20   Wt 199 lb (90.3 kg)   SpO2 100%   BMI 36.40 kg/m  General: Alert, oriented, no acute distress. HEENT: Normocephalic, atraumatic, sclera anicteric. Chest: Clear to auscultation bilaterally.  No wheezes or rhonchi. Cardiovascular: Regular rate and rhythm, no murmurs. Abdomen: obese, soft, nontender.  Normoactive bowel sounds.  No masses or hepatosplenomegaly appreciated.  Well-healed scars. Extremities: Grossly normal range of motion.  Warm, well perfused.  No edema bilaterally. Skin: No rashes or lesions noted. Lymphatics: No cervical, supraclavicular, or inguinal adenopathy. GU: Normal appearing external genitalia without erythema, excoriation, or lesions.  Speculum exam reveals well rugated vaginal mucosa, no lesions or masses noted.  Cervix normal in appearance.  Bimanual exam reveals mobile uterus, no adnexal masses, nodularity.  Laboratory & Radiologic Studies:          Component Ref Range & Units 6  mo ago (07/20/22) 1 yr ago (10/12/21) 1 yr ago (07/17/21) 1 yr ago (04/14/21) 2 yr ago (08/22/20) 2 yr ago (08/01/20) 2 yr ago (06/20/20)  Inhibin B pg/mL 163.9 69.7 CM <7.0 CM 80.4 CM 132.7 CM 187.9 CM 17.5 C      Assessment & Plan: Brandy Knight is a 32 y.o. 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  Finished adjuvant chemotherapy in 10/2020.    Patient is overall doing very well and is NED on exam today.  We will plan to repeat an inhibin B today as this was a tumor marker for her prior to surgery (3,532).   Patient given encouragement with regard to her lifestyle changes to work towards continued weight loss.  We will continue with surveillance visits alternating between my office and medical oncology.  She will follow-up in 3 months with Dr. Bertis Ruddy and will come back to see me  in 6.  We will get imaging if elevation of her tumor marker or symptoms suggest possible recurrence.  She and I discussed signs and symptoms that would be concerning for recurrence today including abnormal uterine bleeding.  She knows to call the clinic if she develops any of these before her next scheduled visit.  20 minutes of total time was spent for this patient encounter, including preparation, face-to-face counseling with the patient and coordination of care, and documentation of the encounter.  Eugene Garnet, MD  Division of Gynecologic Oncology  Department of Obstetrics and Gynecology  [facility]

## 2023-01-28 LAB — INHIBIN B: Inhibin B: 97.2 pg/mL

## 2023-04-10 NOTE — L&D Delivery Note (Signed)
 Progress Note Brandy Knight is a 33 y.o. female G2P1001 with IUP at [redacted]w[redacted]d admitted for IOL for postdates .  She progressed with augmentation to complete and pushed less than 30 minutes to deliver.  Cord clamping delayed by 1-3 minutes then clamped by CNM and cut by FOB.  Placenta intact and spontaneous, bleeding minimal.  Second degree perineal laceration repaired without difficulty.  Mom and baby stable prior to transfer to postpartum.  She requests Nexplanon for birth control.   Delivery Note At 1:33 PM a viable and healthy female was delivered via Vaginal, Spontaneous (Presentation: Left Occiput Anterior).  APGAR: 6, 9; weight pending .   Placenta status: Spontaneous, Intact.  Cord: 3 vessels with the following complications: None.   Anesthesia: Epidural Episiotomy: None Lacerations: 2nd degree;Perineal Suture Repair: 3.0 vicryl Est. Blood Loss (mL): 621  Mom to postpartum.  Baby to Couplet care / Skin to Skin.  Olam Boards 01/25/2024, 2:31 PM

## 2023-05-29 ENCOUNTER — Encounter: Payer: Self-pay | Admitting: Gynecologic Oncology

## 2023-05-30 ENCOUNTER — Other Ambulatory Visit: Payer: Self-pay | Admitting: Gynecologic Oncology

## 2023-05-30 DIAGNOSIS — D3911 Neoplasm of uncertain behavior of right ovary: Secondary | ICD-10-CM

## 2023-05-31 ENCOUNTER — Encounter: Payer: Self-pay | Admitting: Gynecologic Oncology

## 2023-05-31 ENCOUNTER — Inpatient Hospital Stay: Payer: Medicaid Other

## 2023-05-31 ENCOUNTER — Inpatient Hospital Stay: Payer: Medicaid Other | Attending: Gynecologic Oncology | Admitting: Gynecologic Oncology

## 2023-05-31 VITALS — BP 112/68 | HR 77 | Temp 98.6°F | Resp 20 | Wt 199.6 lb

## 2023-05-31 DIAGNOSIS — E669 Obesity, unspecified: Secondary | ICD-10-CM

## 2023-05-31 DIAGNOSIS — D3911 Neoplasm of uncertain behavior of right ovary: Secondary | ICD-10-CM

## 2023-05-31 DIAGNOSIS — Z8543 Personal history of malignant neoplasm of ovary: Secondary | ICD-10-CM | POA: Diagnosis not present

## 2023-05-31 DIAGNOSIS — Z9221 Personal history of antineoplastic chemotherapy: Secondary | ICD-10-CM | POA: Insufficient documentation

## 2023-05-31 DIAGNOSIS — Z90721 Acquired absence of ovaries, unilateral: Secondary | ICD-10-CM | POA: Insufficient documentation

## 2023-05-31 DIAGNOSIS — Z6836 Body mass index (BMI) 36.0-36.9, adult: Secondary | ICD-10-CM | POA: Insufficient documentation

## 2023-05-31 DIAGNOSIS — C562 Malignant neoplasm of left ovary: Secondary | ICD-10-CM

## 2023-05-31 LAB — CBC WITH DIFFERENTIAL/PLATELET
Abs Immature Granulocytes: 0.02 10*3/uL (ref 0.00–0.07)
Basophils Absolute: 0.1 10*3/uL (ref 0.0–0.1)
Basophils Relative: 1 %
Eosinophils Absolute: 0.1 10*3/uL (ref 0.0–0.5)
Eosinophils Relative: 2 %
HCT: 39.3 % (ref 36.0–46.0)
Hemoglobin: 12.4 g/dL (ref 12.0–15.0)
Immature Granulocytes: 0 %
Lymphocytes Relative: 26 %
Lymphs Abs: 1.5 10*3/uL (ref 0.7–4.0)
MCH: 25.8 pg — ABNORMAL LOW (ref 26.0–34.0)
MCHC: 31.6 g/dL (ref 30.0–36.0)
MCV: 81.9 fL (ref 80.0–100.0)
Monocytes Absolute: 0.3 10*3/uL (ref 0.1–1.0)
Monocytes Relative: 5 %
Neutro Abs: 3.9 10*3/uL (ref 1.7–7.7)
Neutrophils Relative %: 66 %
Platelets: 172 10*3/uL (ref 150–400)
RBC: 4.8 MIL/uL (ref 3.87–5.11)
RDW: 13.7 % (ref 11.5–15.5)
WBC: 5.9 10*3/uL (ref 4.0–10.5)
nRBC: 0 % (ref 0.0–0.2)

## 2023-05-31 LAB — COMPREHENSIVE METABOLIC PANEL
ALT: 11 U/L (ref 0–44)
AST: 11 U/L — ABNORMAL LOW (ref 15–41)
Albumin: 4.1 g/dL (ref 3.5–5.0)
Alkaline Phosphatase: 62 U/L (ref 38–126)
Anion gap: 8 (ref 5–15)
BUN: 9 mg/dL (ref 6–20)
CO2: 24 mmol/L (ref 22–32)
Calcium: 9.1 mg/dL (ref 8.9–10.3)
Chloride: 103 mmol/L (ref 98–111)
Creatinine, Ser: 0.99 mg/dL (ref 0.44–1.00)
GFR, Estimated: >60 mL/min (ref >60)
Glucose, Bld: 99 mg/dL (ref 70–99)
Potassium: 3.4 mmol/L — ABNORMAL LOW (ref 3.5–5.1)
Sodium: 135 mmol/L (ref 135–145)
Total Bilirubin: 0.4 mg/dL (ref 0.0–1.2)
Total Protein: 7.4 g/dL (ref 6.5–8.1)

## 2023-05-31 NOTE — Patient Instructions (Signed)
 It was good to see you today.  I do not see or feel any evidence of cancer recurrence on your exam.  I will see you for follow-up in 10 months.  As always, if you develop any new and concerning symptoms before your next visit, please call to see me sooner.

## 2023-05-31 NOTE — Progress Notes (Signed)
 Gynecologic Oncology Return Clinic Visit  05/31/23  Reason for Visit: surveillance  Treatment History: Oncology History Overview Note  1/14: inhibin B - 3,543 Inhibin A - 496  Presented initially on 04/21/20 with suspected pregnancy, increasing abdominal growth. 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   Malignant granulosa cell tumor of ovary, left (HCC)  04/21/2020 Imaging   CT A/P: 1. 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 Ovarian lesion is typically a fibroma in this setting. There is a cleavage plane between the adnexal mass and the uterus, which makes a pedunculated fibroid unlikely. 2. There is a probable nabothian cyst in the cervix. 3. Ascites causes mass effect on the abdominopelvic structures   04/22/2020 Procedure   Paracentesis, 9L  FINAL MICROSCOPIC DIAGNOSIS:  - Atypical cells present  - See comment.    04/23/2020 Procedure   Thoracentesis, 2L  FINAL MICROSCOPIC DIAGNOSIS:  - Reactive mesothelial cells present   04/27/2020 Surgery   Robotic-assisted lysis of adhesions, left salpingoo-oophorectomy, laparotomy for specimen removal, ileocecal resection and reanastomosis, appendectomy, omentectomy  Findings: On EUA, small mobile uterus with mass that moves in junction with the uterus, sitting out of the pelvis and palpable with the abdominal hand. Mass is firm and adherent to the anterior abdominal wall. On intra-abdominal entry, approximately 6L of green-tinged ascites noted and removed. Normal appearing upper abdominal survey. Normal appearing omentum. 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 zsome pockets of degeneration that are unavoidably entered during gentle manipulation of the ovary. Mass with dense adhesions to the anterior abdominal wall  on the right, the right sidewall, and several loops of ileum. Uterus 8cm and normal appearing. Right fallopian tube and ovary without evidence of disease. Some inflammatory rind noted on the small and large bowel. Given size of mass and its suspected degeneration and poor integrity, unable to place in an Endocatch bag and midline excision extended for specimen removal. Given concern for thermal damage to the terminal ileum from lyssi of dense adhesions, decision made to proceed with ileocectomy. 1-1.5cm mesenteric node palpated within the mesentery removed. Some shotty sub centimeter mesenteric nodes palpated. No para-aortic or pelvic lymphadenopathy. Liver edge, diaphragm and stomach smooth. Omentum without evidence of disease. An additional 3L of ascites was removed over the course of the surgery.   04/27/2020 Pathology Results   A. OVARY AND FALLOPIAN TUBE, LEFT, SALPINGO OOPHORECTOMY:  - Granulosa cell tumor, adult type, multiple fragments measuring 18.5 cm  in aggregate  - No evidence of ovarian surface involvement  - Benign unremarkable fallopian tube  - See oncology table   B. ILEOCECUM AND APPENDIX, RESECTION:  - Segment of terminal ileum (9 cm) and colon (10 cm) showing focal  serosal disruption  - Unremarkable appendix  - Margins appear viable  - Benign lymph nodes  - No evidence of malignancy   C. OMENTUM, RESECTION:  - Portion of omentum with congestion and mild mesothelial hyperplasia  - No evidence of malignancy   OVARY or FALLOPIAN TUBE or PRIMARY PERITONEUM: Resection   Procedure: Salpingo-oophorectomy  Specimen Integrity: Fragmented  Tumor Site: Left ovary  Tumor Size: Multiple tissue fragments measured 18.5 cm in aggregate  Histologic Type: Granulosa cell tumor, adult type  Histologic Grade: Not applicable  Ovarian Surface Involvement: Not identified  Fallopian Tube Surface Involvement: Not identified  Implants (required for advanced stage serous/seromucinous  borderline  tumors only): Not applicable  Other Tissue/ Organ Involvement: Not applicable  Largest Extrapelvic Peritoneal Focus: Not identified  Peritoneal/Ascitic Fluid Involvement: Not identified ([id number])  Chemotherapy Response Score (CRS): Not applicable, no known presurgical  therapy  Regional Lymph Nodes: Not applicable (no lymph nodes submitted or found)  Distant Metastasis:       Distant Site(s) Involved: Not applicable  Pathologic Stage Classification (pTNM, AJCC 8th Edition): pT1c, pN not  assigned (no nodes submitted or found).  See comment  Ancillary Studies: Can be performed upon request  Representative Tumor Block: A2  Comment(s): The tumor stage is due to intraoperative surgical spill of  the tumor cells based on discussion with the surgeon Dr. Eugene Garnet.   04/27/2020 Cancer Staging   Staging form: Ovary, Fallopian Tube, and Primary Peritoneal Carcinoma, AJCC 8th Edition - Clinical stage from 04/27/2020: FIGO Stage IC1, calculated as Stage IC (cT1c1, cN0, cM0) - Signed by Carver Fila, MD on 05/05/2020   05/05/2020 Initial Diagnosis   Granulosa cell tumor of ovary, right   05/30/2020 - 10/12/2020 Chemotherapy    Patient is on Treatment Plan: OVARIAN CARBOPLATIN (AUC 6) / PACLITAXEL (175) Q21D X 6 CYCLES       06/21/2020 Tumor Marker   Patient's tumor was tested for the following markers: Inhibin B Results of the tumor marker test revealed 17.5   10/13/2020 Imaging   Negative. No evidence of recurrent or metastatic carcinoma within the abdomen or pelvis.     Interval History: Doing well.  Denies any abdominal or pelvic pain.  Reports baseline bowel bladder function.  Endorses regular menses, monthly, lasting for 3 days.  Endorses a good appetite without nausea or emesis.  She has been working for the last 5 months, enjoying being back at work.  Trying to achieve some weight loss.  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   Ascites    Medical history non-contributory    Pelvic mass     Past Surgical History:  Procedure Laterality Date   IR PARACENTESIS  04/22/2020   LAPAROTOMY N/A 04/27/2020   Procedure: MINI LAPAROTOMY, ILEOCECAL RESECTION, APPENDECTOMY;  Surgeon: Carver Fila, MD;  Location: [facility];  Service: Gynecology;  Laterality: N/A;   ROBOTIC ASSISTED SALPINGO OOPHERECTOMY Right 04/27/2020   Procedure: XI ROBOTIC ASSISTED UNITLATERAL SALPINGO OOPHORECTOMY;  Surgeon: Carver Fila, MD;  Location: [facility];  Service: Gynecology;  Laterality: Right;   THORACENTESIS     WISDOM TOOTH EXTRACTION      Family History  Problem Relation Age of Onset   Diabetes Mother    Diabetes Father    Seizures Sister    Myasthenia gravis Sister    ADD / ADHD Sister    Ovarian cancer Neg Hx    Uterine cancer Neg Hx    Breast cancer Neg Hx    Colon cancer Neg Hx     Social History   Socioeconomic History   Marital status: Single    Spouse name: Not on file   Number of children: 1   Years of education: Not on file   Highest education level: Not on file  Occupational History    Employer: [employer]  Tobacco Use   Smoking status: Never   Smokeless tobacco: Never  Vaping Use   Vaping status: Never Used  Substance and Sexual Activity   Alcohol use: No   Drug use: No   Sexual activity: Yes  Birth control/protection: None  Other Topics Concern   Not on file  Social History Narrative   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)   Social Drivers of Health   Financial Resource Strain: Low Risk  (01/31/2021)   Overall Financial Resource Strain (CARDIA)    Difficulty of Paying Living Expenses: Not very hard  Food Insecurity: No Food Insecurity (01/31/2021)   Hunger Vital Sign    Worried About Running Out of Food in the Last Year: Never true    Ran Out of Food in the Last Year: Never true  Transportation Needs: No Transportation Needs (01/31/2021)   PRAPARE -  Administrator, Civil Service (Medical): No    Lack of Transportation (Non-Medical): No  Physical Activity: Sufficiently Active (01/31/2021)   Exercise Vital Sign    Days of Exercise per Week: 4 days    Minutes of Exercise per Session: 50 min  Stress: No Stress Concern Present (01/31/2021)   [employer] of Occupational Health - Occupational Stress Questionnaire    Feeling of Stress : Not at all  Social Connections: Moderately Isolated (01/31/2021)   Social Connection and Isolation Panel [NHANES]    Frequency of Communication with Friends and Family: More than three times a week    Frequency of Social Gatherings with Friends and Family: More than three times a week    Attends Religious Services: Never    Database administrator or Organizations: No    Attends Engineer, structural: 1 to 4 times per year    Marital Status: Never married    Current Medications: No current outpatient medications on file.  Review of Systems: Denies appetite changes, fevers, chills, fatigue, unexplained weight changes. Denies hearing loss, neck lumps or masses, mouth sores, ringing in ears or voice changes. Denies cough or wheezing.  Denies shortness of breath. Denies chest pain or palpitations. Denies leg swelling. Denies abdominal distention, pain, blood in stools, constipation, diarrhea, nausea, vomiting, or early satiety. Denies pain with intercourse, dysuria, frequency, hematuria or incontinence. Denies hot flashes, pelvic pain, vaginal bleeding or vaginal discharge.   Denies joint pain, back pain or muscle pain/cramps. Denies itching, rash, or wounds. Denies dizziness, headaches, numbness or seizures. Denies swollen lymph nodes or glands, denies easy bruising or bleeding. Denies anxiety, depression, confusion, or decreased concentration.  Physical Exam: BP 112/68 (BP Location: Left Arm, Patient Position: Sitting)   Pulse 77   Temp 98.6 F (37 C) (Oral)   Resp 20   Wt  199 lb 9.6 oz (90.5 kg)   SpO2 100%   BMI 36.51 kg/m  General: Alert, oriented, no acute distress. HEENT: Normocephalic, atraumatic, sclera anicteric. Chest: Clear to auscultation bilaterally.  No wheezes or rhonchi. Cardiovascular: Regular rate and rhythm, no murmurs. Abdomen: obese, soft, nontender.  Normoactive bowel sounds.  No masses or hepatosplenomegaly appreciated.  Well-healed scars. Extremities: Grossly normal range of motion.  Warm, well perfused.  No edema bilaterally. Skin: No rashes or lesions noted. Lymphatics: No cervical, supraclavicular, or inguinal adenopathy. GU: Normal appearing external genitalia without erythema, excoriation, or lesions.  Speculum exam reveals well rugated vaginal mucosa, no lesions or masses noted.  Cervix normal in appearance.  Bimanual exam reveals mobile uterus, no adnexal masses, nodularity.  Laboratory & Radiologic Studies:          Component Ref Range & Units (hover) 4 mo ago (01/25/23) 10 mo ago (07/20/22) 1 yr ago (10/12/21) 1 yr ago (07/17/21) 2 yr ago (04/14/21) 2 yr ago (08/22/20) 2  yr ago (08/01/20)  Inhibin B 97.2 163.9 CM 69.7 CM <7.0 CM 80.4 CM 132.7 CM 187.9 CM     Assessment & Plan: Brandy Knight is a 33 y.o. 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  Finished adjuvant chemotherapy in 10/2020.    Patient is overall doing very well and is NED on exam today.  We will plan to repeat an inhibin B today as this was a tumor marker for her prior to surgery (3,532).   Patient given encouragement regarding weight loss.   We will continue with surveillance visits alternating between my office and medical oncology.  She will follow-up in 5 months with Dr. Bertis Ruddy and will come back to see me in December.  We will get imaging if elevation of her tumor marker or symptoms suggest possible recurrence.    We discussed signs and symptoms that would be concerning for recurrence today including abnormal  uterine bleeding.  She knows to call the clinic if she develops any of these before her next scheduled visit.  20 minutes of total time was spent for this patient encounter, including preparation, face-to-face counseling with the patient and coordination of care, and documentation of the encounter.  Eugene Garnet, MD  Division of Gynecologic Oncology  Department of Obstetrics and Gynecology  [facility]

## 2023-06-01 LAB — INHIBIN B: Inhibin B: 10.4 pg/mL

## 2023-10-17 ENCOUNTER — Inpatient Hospital Stay: Payer: Medicaid Other | Admitting: Hematology and Oncology

## 2023-10-17 ENCOUNTER — Inpatient Hospital Stay: Payer: Medicaid Other | Attending: Hematology and Oncology

## 2023-10-17 ENCOUNTER — Encounter: Payer: Self-pay | Admitting: Hematology and Oncology

## 2023-10-17 VITALS — BP 106/65 | HR 76 | Temp 97.5°F | Resp 18 | Ht 62.0 in | Wt 183.0 lb

## 2023-10-17 DIAGNOSIS — C562 Malignant neoplasm of left ovary: Secondary | ICD-10-CM

## 2023-10-17 DIAGNOSIS — Z8543 Personal history of malignant neoplasm of ovary: Secondary | ICD-10-CM | POA: Diagnosis present

## 2023-10-17 DIAGNOSIS — D649 Anemia, unspecified: Secondary | ICD-10-CM | POA: Diagnosis not present

## 2023-10-17 LAB — COMPREHENSIVE METABOLIC PANEL WITH GFR
ALT: 15 U/L (ref 0–44)
AST: 11 U/L — ABNORMAL LOW (ref 15–41)
Albumin: 3.3 g/dL — ABNORMAL LOW (ref 3.5–5.0)
Alkaline Phosphatase: 73 U/L (ref 38–126)
Anion gap: 8 (ref 5–15)
BUN: 7 mg/dL (ref 6–20)
CO2: 23 mmol/L (ref 22–32)
Calcium: 9.1 mg/dL (ref 8.9–10.3)
Chloride: 106 mmol/L (ref 98–111)
Creatinine, Ser: 0.66 mg/dL (ref 0.44–1.00)
GFR, Estimated: >60 mL/min (ref >60)
Glucose, Bld: 83 mg/dL (ref 70–99)
Potassium: 3.5 mmol/L (ref 3.5–5.1)
Sodium: 137 mmol/L (ref 135–145)
Total Bilirubin: 0.3 mg/dL (ref 0.0–1.2)
Total Protein: 6.6 g/dL (ref 6.5–8.1)

## 2023-10-17 LAB — CBC WITH DIFFERENTIAL/PLATELET
Abs Immature Granulocytes: 0.09 K/uL — ABNORMAL HIGH (ref 0.00–0.07)
Basophils Absolute: 0.1 K/uL (ref 0.0–0.1)
Basophils Relative: 1 %
Eosinophils Absolute: 0.1 K/uL (ref 0.0–0.5)
Eosinophils Relative: 1 %
HCT: 31.6 % — ABNORMAL LOW (ref 36.0–46.0)
Hemoglobin: 10.5 g/dL — ABNORMAL LOW (ref 12.0–15.0)
Immature Granulocytes: 1 %
Lymphocytes Relative: 19 %
Lymphs Abs: 1.8 K/uL (ref 0.7–4.0)
MCH: 26.9 pg (ref 26.0–34.0)
MCHC: 33.2 g/dL (ref 30.0–36.0)
MCV: 80.8 fL (ref 80.0–100.0)
Monocytes Absolute: 0.5 K/uL (ref 0.1–1.0)
Monocytes Relative: 6 %
Neutro Abs: 6.7 K/uL (ref 1.7–7.7)
Neutrophils Relative %: 72 %
Platelets: 159 K/uL (ref 150–400)
RBC: 3.91 MIL/uL (ref 3.87–5.11)
RDW: 14.3 % (ref 11.5–15.5)
WBC: 9.2 K/uL (ref 4.0–10.5)
nRBC: 0 % (ref 0.0–0.2)

## 2023-10-17 NOTE — Assessment & Plan Note (Addendum)
 She was noticed with granulosa cell tumor of the ovary in 2022, status post surgery and completion of adjuvant chemotherapy by July 2022 Pathology: Granulosa cell tumor of the ovary  Since last time I saw her, she has lost weight However, she noted that her abdomen appears more protuberant She denies abdominal pain or vaginal bleeding I recommend CT imaging for evaluation to rule out recurrent cancer and she is in agreement to proceed

## 2023-10-17 NOTE — Assessment & Plan Note (Addendum)
 She has new onset of anemia Malignancy cannot be excluded She is not symptomatic Observe closely for now

## 2023-10-17 NOTE — Progress Notes (Signed)
 [facility] OFFICE PROGRESS NOTE  Patient Care Team: Pcp, No as PCP - General Viktoria Comer SAUNDERS, MD as Consulting Physician (Gynecologic Oncology) Micheline Eleanor BIRCH, NP as Nurse Practitioner (Gynecologic Oncology) Lonn Hicks, MD as Consulting Physician (Hematology and Oncology) Starla Wendelyn BIRCH, RN as Registered Nurse  Assessment & Plan Malignant granulosa cell tumor of ovary, left [facility]) She was noticed with granulosa cell tumor of the ovary in 2022, status post surgery and completion of adjuvant chemotherapy by July 2022 Pathology: Granulosa cell tumor of the ovary  Since last time I saw her, she has lost weight However, she noted that her abdomen appears more protuberant She denies abdominal pain or vaginal bleeding I recommend CT imaging for evaluation to rule out recurrent cancer and she is in agreement to proceed Anemia, mild She has new onset of anemia Malignancy cannot be excluded She is not symptomatic Observe closely for now  Orders Placed This Encounter  Procedures   CT ABDOMEN PELVIS W CONTRAST    Standing Status:   Future    Expected Date:   10/24/2023    Expiration Date:   10/16/2024    Scheduling Instructions:     No need oral contrast    If indicated for the ordered procedure, I authorize the administration of contrast media per Radiology protocol:   Yes    Does the patient have a contrast media/X-ray dye allergy?:   No    Is patient pregnant?:   No    Preferred imaging location?:   [facility]    If indicated for the ordered procedure, I authorize the administration of oral contrast media per Radiology protocol:   No    Reason for no oral contrast::   No need oral contrast     Hicks Lonn, MD  INTERVAL HISTORY: she returns for surveillance follow-up for history of malignant granulosa cell tumor of the ovary Since last time I saw her, she has lost some weight She thinks her weight loss is due to being more mobile She has intermittent  hot flashes She denies abdominal pain or changes in bowel habits  PHYSICAL EXAMINATION: ECOG PERFORMANCE STATUS: 0 - Asymptomatic  Vitals:   10/17/23 1000  BP: 106/65  Pulse: 76  Resp: 18  Temp: (!) 97.5 F (36.4 C)  SpO2: 100%   Filed Weights   10/17/23 1000  Weight: 183 lb (83 kg)   GENERAL:alert, no distress and comfortable SKIN: skin color, texture, turgor are normal, no rashes or significant lesions EYES: normal, conjunctiva are pink and non-injected, sclera clear OROPHARYNX:no exudate, no erythema and lips, buccal mucosa, and tongue normal  NECK: supple, thyroid normal size, non-tender, without nodularity LYMPH:  no palpable lymphadenopathy in the cervical, axillary or inguinal LUNGS: clear to auscultation and percussion with normal breathing effort HEART: regular rate & rhythm and no murmurs and no lower extremity edema ABDOMEN:abdomen soft, appears protuberant, intra-abdominal mass cannot be excluded, limited examination due to obesity Musculoskeletal:no cyanosis of digits and no clubbing  PSYCH: alert & oriented x 3 with fluent speech NEURO: no focal motor/sensory deficits  Relevant data reviewed during this visit included CBC and CMP

## 2023-10-25 ENCOUNTER — Ambulatory Visit (HOSPITAL_COMMUNITY)
Admission: RE | Admit: 2023-10-25 | Discharge: 2023-10-25 | Disposition: A | Discharge status: Home / Self Care | Source: Ambulatory Visit | Attending: Hematology and Oncology | Admitting: Hematology and Oncology

## 2023-10-25 DIAGNOSIS — C562 Malignant neoplasm of left ovary: Secondary | ICD-10-CM | POA: Insufficient documentation

## 2023-10-25 MED ORDER — IOHEXOL 300 MG/ML  SOLN
100.0000 mL | Freq: Once | INTRAMUSCULAR | Status: AC | PRN
  Administered 2023-10-25: 100 mL via INTRAVENOUS

## 2023-10-28 ENCOUNTER — Telehealth: Payer: Self-pay | Admitting: Hematology and Oncology

## 2023-10-28 NOTE — Telephone Encounter (Signed)
 I received results of her CT scan which showed that the patient is pregnant I called the patient The patient could not remember the day of her last menstrual cycle She took a urine pregnancy test a week and a half ago and it came back positive but she told me due to prior false positive pregnancy test associated with a cancer, she cannot be sure that she is truly pregnant and desires a CT scan anyways and did not disclose the information to me  There are the slight abnormalities on her CT imaging, difficult to interpret in the setting of pregnancy I reached out to her GYN surgeon, Dr. Lewie team and recommend the patient to be seen this Thursday with Dr. Viktoria instead of being I reviewed the findings with the patient and she agreed to be seen by Dr. Viktoria this week

## 2023-10-30 ENCOUNTER — Telehealth: Payer: Self-pay

## 2023-10-30 ENCOUNTER — Telehealth: Payer: Self-pay | Admitting: *Deleted

## 2023-10-30 NOTE — Telephone Encounter (Signed)
 Thanks Ami. I spoke with her. Discussed findings on CT and that given advanced gestational age, very hard to tell if what they are seeing is just related to her pregnancy or to her cancer history. She is scheduled for OB visit on 7/29. I asked her to call us  to let us  know her EDD (I think she is third trimester based on fetal size on CT).  Could you please schedule her for follow-up in 3-4 months with me so that we have something on the books after she delivers? Thank you

## 2023-10-30 NOTE — Telephone Encounter (Signed)
 Spoke with patient and scheduled appointment on 7/24 at 8am.

## 2023-10-30 NOTE — Telephone Encounter (Signed)
 Spoke with patient and relayed message from providers that patient needs to follow up with ob/gyn for prenatal care. Pt states she has an appointment with the [facility] on [address]. For Tuesday, July 29 th. Pt's appointment has been canceled with Dr. Viktoria for tomorrow morning. Pt thanked the office for calling.

## 2023-10-30 NOTE — Telephone Encounter (Signed)
 Ok to leave that for now - will you just have on your list to follow-up with her after her visit on the 29th? If she is going to delivery in August or September, I'd like to see her back in Oct or Nov. Thanks!

## 2023-10-31 ENCOUNTER — Inpatient Hospital Stay: Admitting: Hematology and Oncology

## 2023-10-31 ENCOUNTER — Ambulatory Visit: Admitting: Gynecologic Oncology

## 2023-11-05 ENCOUNTER — Other Ambulatory Visit: Payer: Self-pay

## 2023-11-05 ENCOUNTER — Ambulatory Visit (INDEPENDENT_AMBULATORY_CARE_PROVIDER_SITE_OTHER)

## 2023-11-05 VITALS — BP 103/54 | HR 86 | Ht 62.0 in | Wt 183.0 lb

## 2023-11-05 DIAGNOSIS — Z3201 Encounter for pregnancy test, result positive: Secondary | ICD-10-CM

## 2023-11-05 DIAGNOSIS — Z3200 Encounter for pregnancy test, result unknown: Secondary | ICD-10-CM

## 2023-11-05 NOTE — Patient Instructions (Signed)

## 2023-11-05 NOTE — Addendum Note (Signed)
 Addended by: JOSHUA CYNDEE BROCKS on: 11/05/2023 04:29 PM   Modules accepted: Orders

## 2023-11-05 NOTE — Progress Notes (Signed)
 Possible Pregnancy  Here today for pregnancy confirmation. UPT in office today is positive. Pt reports first positive home UPT on 10/24/2023. Pt had CT scan on 10/25/2023 that showed a third trimester breech fetus. Reviewed dating with patient:   LMP: Potentially in February  EDD: unable to determine due to uncertain LMP   FHR 138 today    OB history reviewed. Reviewed medications and allergies with patient; list of medications safe to take during pregnancy given.  Recommended pt begin prenatal vitamin and schedule prenatal care.  Cyndee JAYSON Molt, RN 11/05/2023  1:41 PM

## 2023-11-06 ENCOUNTER — Ambulatory Visit (INDEPENDENT_AMBULATORY_CARE_PROVIDER_SITE_OTHER): Admitting: Obstetrics and Gynecology

## 2023-11-06 ENCOUNTER — Encounter: Payer: Self-pay | Admitting: Obstetrics and Gynecology

## 2023-11-06 ENCOUNTER — Other Ambulatory Visit (HOSPITAL_COMMUNITY)
Admission: RE | Admit: 2023-11-06 | Discharge: 2023-11-06 | Disposition: A | Discharge status: Home / Self Care | Source: Ambulatory Visit | Attending: Obstetrics and Gynecology | Admitting: Obstetrics and Gynecology

## 2023-11-06 ENCOUNTER — Ambulatory Visit

## 2023-11-06 ENCOUNTER — Other Ambulatory Visit: Payer: Self-pay

## 2023-11-06 VITALS — BP 116/67 | HR 84 | Wt 181.0 lb

## 2023-11-06 DIAGNOSIS — O0933 Supervision of pregnancy with insufficient antenatal care, third trimester: Secondary | ICD-10-CM | POA: Insufficient documentation

## 2023-11-06 DIAGNOSIS — Z3A25 25 weeks gestation of pregnancy: Secondary | ICD-10-CM

## 2023-11-06 DIAGNOSIS — O285 Abnormal chromosomal and genetic finding on antenatal screening of mother: Secondary | ICD-10-CM | POA: Diagnosis not present

## 2023-11-06 DIAGNOSIS — O0992 Supervision of high risk pregnancy, unspecified, second trimester: Secondary | ICD-10-CM

## 2023-11-06 DIAGNOSIS — Z31430 Encounter of female for testing for genetic disease carrier status for procreative management: Secondary | ICD-10-CM | POA: Diagnosis not present

## 2023-11-06 DIAGNOSIS — O0990 Supervision of high risk pregnancy, unspecified, unspecified trimester: Secondary | ICD-10-CM | POA: Insufficient documentation

## 2023-11-06 DIAGNOSIS — O0930 Supervision of pregnancy with insufficient antenatal care, unspecified trimester: Secondary | ICD-10-CM | POA: Insufficient documentation

## 2023-11-06 DIAGNOSIS — Z820 Family history of epilepsy and other diseases of the nervous system: Secondary | ICD-10-CM

## 2023-11-06 DIAGNOSIS — Z9221 Personal history of antineoplastic chemotherapy: Secondary | ICD-10-CM | POA: Insufficient documentation

## 2023-11-06 DIAGNOSIS — C562 Malignant neoplasm of left ovary: Secondary | ICD-10-CM

## 2023-11-06 DIAGNOSIS — Z3200 Encounter for pregnancy test, result unknown: Secondary | ICD-10-CM

## 2023-11-06 DIAGNOSIS — O4440 Low lying placenta NOS or without hemorrhage, unspecified trimester: Secondary | ICD-10-CM

## 2023-11-06 NOTE — Progress Notes (Unsigned)
 New OB Note  11/06/2023   Clinic: [facility]   Chief Complaint: new OB  Transfer of Care Patient: no  History of Present Illness: Brandy Knight is a 33 y.o. G2P1001 at ?25/4 weeks (EDC 11/8, based on Patient's last menstrual period was 05/11/2023 (approximate).).    Pregnancy complicated by has Supervision of high risk pregnancy, antepartum; Right adnexal mass; Malignant granulosa cell tumor of ovary, left (HCC); Preventive measure; Weight gain; Obesity, Class II, BMI 35-39.9; Anemia, mild; History of chemotherapy; No prenatal care in current pregnancy; and Family history of seizure in son on their problem list. Patient states that her son has a seizure d/o and they eventually found out that he has an extra chromosome; pt unsure of which one or d/o name.   Patient seen by her Heme Onc doctor for belly feeling bigger and fullness on 7/10. Prior to this, last visit with Gyn Onc was 05/2023 and NED at that time. 7/18 CT A/P with contrast (no UPT done) ordered and showed  single IUP. Measurements not done; also, possible low lying placenta, breech and a lobulated soft tissue density measuring 7.7cm x 3.8cm seen on CT.  Gyn Onc stated to have patient follow up with GO sometime after her new OB appointment today and if she's going to deliver in August or September to see her back in October or November.   She has not had any ultrasounds yet (pt scheduled for tomorrow) and pt unsure of LMP and not on birth control when she conceived.  No VB, contractions, abdominal pain, nausea/vomiting, and +FM  ROS: A 12-point review of systems was performed and negative, except as stated in the above HPI.  OBGYN History: As per HPI. OB History  Gravida Para Term Preterm AB Living  2 1 1   1   SAB IAB Ectopic Multiple Live Births      1    # Outcome Date GA Lbr Len/2nd Weight Sex Type Anes PTL Lv  2 Current           1 Term 09/16/13 [redacted]w[redacted]d 07:37 / 00:38 6 lb 7.9 oz (2.945 kg) M  Vag-Spont None  LIV   History of pap smears: Yes. 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   Ascites    Medical history non-contributory    Pelvic mass     Past Surgical History: Past Surgical History:  Procedure Laterality Date   IR PARACENTESIS  04/22/2020   LAPAROTOMY N/A 04/27/2020   Procedure: MINI LAPAROTOMY, ILEOCECAL RESECTION, APPENDECTOMY;  Surgeon: Viktoria Comer SAUNDERS, MD;  Location: [facility];  Service: Gynecology;  Laterality: N/A;   ROBOTIC ASSISTED SALPINGO OOPHERECTOMY Right 04/27/2020   Procedure: XI ROBOTIC ASSISTED UNITLATERAL SALPINGO OOPHORECTOMY;  Surgeon: Viktoria Comer SAUNDERS, MD;  Location: [facility];  Service: Gynecology;  Laterality: Right;   THORACENTESIS     WISDOM TOOTH EXTRACTION      Family History:  Family History  Problem Relation Age of Onset   Diabetes Mother    Diabetes Father    Seizures Sister    Myasthenia gravis Sister    ADD / ADHD Sister    Ovarian cancer Neg Hx    Uterine cancer Neg Hx    Breast cancer Neg Hx    Colon cancer Neg Hx     Social History:  Social History   Socioeconomic History  Marital status: Single    Spouse name: Not on file   Number of children: 1   Years of education: Not on file   Highest education level: Not on file  Occupational History    Employer: [employer]  Tobacco Use   Smoking status: Never   Smokeless tobacco: Never  Vaping Use   Vaping status: Never Used  Substance and Sexual Activity   Alcohol use: No   Drug use: No   Sexual activity: Not Currently    Birth control/protection: None  Other Topics Concern   Not on file  Social History Narrative   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)   Social Drivers of Health   Financial Resource Strain: Low Risk  (01/31/2021)   Overall Financial Resource Strain (CARDIA)    Difficulty of Paying Living Expenses: Not very hard  Food Insecurity: No  Food Insecurity (01/31/2021)   Hunger Vital Sign    Worried About Running Out of Food in the Last Year: Never true    Ran Out of Food in the Last Year: Never true  Transportation Needs: No Transportation Needs (01/31/2021)   PRAPARE - Administrator, Civil Service (Medical): No    Lack of Transportation (Non-Medical): No  Physical Activity: Sufficiently Active (01/31/2021)   Exercise Vital Sign    Days of Exercise per Week: 4 days    Minutes of Exercise per Session: 50 min  Stress: No Stress Concern Present (01/31/2021)   [employer] of Occupational Health - Occupational Stress Questionnaire    Feeling of Stress : Not at all  Social Connections: Moderately Isolated (01/31/2021)   Social Connection and Isolation Panel    Frequency of Communication with Friends and Family: More than three times a week    Frequency of Social Gatherings with Friends and Family: More than three times a week    Attends Religious Services: Never    Database administrator or Organizations: No    Attends Banker Meetings: 1 to 4 times per year    Marital Status: Never married  Intimate Partner Violence: Not At Risk (01/31/2021)   Humiliation, Afraid, Rape, and Kick questionnaire    Fear of Current or Ex-Partner: No    Emotionally Abused: No    Physically Abused: No    Sexually Abused: No   Allergy: No Known Allergies  Current Outpatient Medications: Prenatal vitamin  Physical Exam:   BP 116/67   Pulse 84   Wt 181 lb (82.1 kg)   LMP 05/11/2023 (Approximate)   BMI 33.11 kg/m  Body mass index is 33.11 kg/m. Contractions: Not present Vag. Bleeding: None. Fundal height: 29 FHTs: 141  General appearance: Well nourished, well developed female in no acute distress.  Neck:  Supple, normal appearance, and no thyromegaly  Cardiovascular: S1, S2 normal, no murmur, rub or gallop, regular rate and rhythm Respiratory:  Clear to auscultation bilateral. Normal respiratory  effort Abdomen: gravid, nttp Neuro/Psych:  Normal mood and affect.  Skin:  Warm and dry.   Laboratory: reviewed  Imaging:  Narrative & Impression  CLINICAL DATA:  History of left ovarian granulosa cell tumor. New onset bloating and firmness to palpation. * Tracking Code: BO *   EXAM: CT ABDOMEN AND PELVIS WITH CONTRAST   TECHNIQUE: Multidetector CT imaging of the abdomen and pelvis was performed using the standard protocol following bolus administration of intravenous contrast.   RADIATION DOSE REDUCTION: This exam was performed according to the departmental dose-optimization program which includes automated exposure  control, adjustment of the mA and/or kV according to patient size and/or use of iterative reconstruction technique.   CONTRAST:  OMNIPAQUE  IOHEXOL  300 MG/ML  SOLN   COMPARISON:  CT abdomen and pelvis dated 10/13/2020   FINDINGS: Lower chest: No focal consolidation or pulmonary nodule in the lung bases. No pleural effusion or pneumothorax demonstrated. Partially imaged heart size is normal.   Hepatobiliary: Ill-defined subcentimeter hyperdensity in segment 7/8 (2:13) and 4/8 (2:17), likely perfusional. No intra or extrahepatic biliary ductal dilation. Normal gallbladder.   Pancreas: No focal lesions or main ductal dilation.   Spleen: Normal in size without focal abnormality.   Adrenals/Urinary Tract: No adrenal nodules. No suspicious renal mass, calculi or hydronephrosis. Mild prominence of bilateral renal collecting systems, likely secondary to gravid uterus. No focal bladder wall thickening.   Stomach/Bowel: Normal appearance of the stomach. No evidence of bowel wall thickening, distention, or inflammatory changes. Appendix is not discretely seen. Nodular focus adjacent to the cecum measuring 1.7 x 1.2 cm (2:51), may represent the collapsed appendix.   Vascular/Lymphatic: Markedly enlarged right ovarian vein (2:45). Prominent bilateral pelvic  vessels. No enlarged abdominal or pelvic lymph nodes.   Reproductive: Gravid uterus. Intrauterine gestation in breech presentation. Posterior placenta with suggestion of low lying placental margin. Within the right adnexa is a lobulated soft tissue density measuring 7.7 x 3.8 cm (2:57). Prior left salpingo oophorectomy. No definite left adnexal mass, allowing for physiologically enlarged vasculature.   Other: No free fluid, fluid collection, or free air.   Musculoskeletal: No acute or abnormal lytic or blastic osseous lesions.   IMPRESSION: 1. Gravid uterus with intrauterine gestation in breech presentation. Posterior placenta with suggestion of low lying placental margin. Recommend consultation to obstetrics for establishment of prenatal care and for further evaluation of the placenta. 2. Lobulated soft tissue density within the right adnexa may represent continuation of the markedly enlarged right ovarian vein, likely physiologic in the setting of pregnancy. The right ovary is not definitely seen. 3. No definite left adnexal mass, allowing for physiologically enlarged vasculature. 4. Appendix is not discretely seen. Nodular focus adjacent to the cecum measuring 1.7 x 1.2 cm, may represent the collapsed appendix. Recommend attention on follow-up postnatally.   These results will be called to the ordering clinician or representative by the Radiologist Assistant, and communication documented in the PACS or [employer].     Electronically Signed   By: Limin  Xu M.D.   On: 10/25/2023 17:40     Assessment: patient stable  Plan: 1. Supervision of high risk pregnancy, antepartum (Primary) Needs GTT next visit Ultrasound tech able to do patient later today. Follow up for dating I d/w Dr. William yesterday and he is working on moving up patient's MFM anatomy u/s ASAP - Culture, OB Urine - Hemoglobin A1c - CBC/D/Plt+RPR+Rh+ABO+RubIgG... - US  OB Limited; Future - TSH  Rfx on Abnormal to Free T4 - Comprehensive metabolic panel with GFR - Protein / creatinine ratio, urine - Anemia Profile B  2. [redacted] weeks gestation of pregnancy See above - Culture, OB Urine - Hemoglobin A1c - CBC/D/Plt+RPR+Rh+ABO+RubIgG... - US  OB Limited; Future - TSH Rfx on Abnormal to Free T4 - Comprehensive metabolic panel with GFR - Protein / creatinine ratio, urine - Anemia Profile B - AMB MFM GENETICS REFERRAL  3. Low lying placenta Follow up mfm u/s. In the interim, d/w pt to do pelvic rest   4. High-risk pregnancy - PANORAMA PRENATAL TEST  5. Encounter of female for testing for  genetic disease carrier status for procreative management - HORIZON Basic Panel  6. Abnormal chromosomal and genetic finding on antenatal screening mother Pt to let us  know what extra choromosome son has/d/o name is. She is amenable to genetic screening; pt aware that Panorama likely won't be able to detect issues that prior son had - AMB MFM GENETICS REFERRAL  7. Family history of seizure in son See above - AMB MFM GENETICS REFERRAL  8. History of granulosa cell tumor Followed by Gyn Onc (Dr. Viktoria). Will CC note to her from today and any updates If needs a scheduled c/s for some reason, likely better to do it at [facility], given CT findings  9. History of chemotherapy S/p six cycles of carboplatin /paclitaxel , completed July 2022.   Problem list reviewed and updated.  Follow up in 2 weeks.  >50% of 60 min visit spent on counseling and coordination of care.   Return in about 2 weeks (around 11/20/2023) for low risk ob, in person, md visit, fasting 2hr GTT.  Future Appointments  Date Time Provider Department Center  11/06/2023  4:30 PM [facility] US2 [facility]  12/23/2023  8:00 AM [facility] PROVIDER 1 [facility]  12/23/2023  8:30 AM [facility] US5 [facility]  03/13/2024  1:15 PM [facility] LAB [facility] None  03/13/2024  1:30 PM Viktoria Comer SAUNDERS, MD [facility] None    Brandy Knight. MD Attending Center for [facility] (Faculty Practice)   ADDENDUM: Office u/s shows baby to be 29/5 weeks, [facility] Oct 10 and 1340gm (approx 30%) and normal MVP and still breech. No assessment of previa or low lying placenta done. Dates updated in computer

## 2023-11-07 ENCOUNTER — Other Ambulatory Visit: Payer: Self-pay | Admitting: Obstetrics and Gynecology

## 2023-11-07 ENCOUNTER — Telehealth: Payer: Self-pay | Admitting: *Deleted

## 2023-11-07 ENCOUNTER — Other Ambulatory Visit

## 2023-11-07 DIAGNOSIS — O0990 Supervision of high risk pregnancy, unspecified, unspecified trimester: Secondary | ICD-10-CM

## 2023-11-07 DIAGNOSIS — Z820 Family history of epilepsy and other diseases of the nervous system: Secondary | ICD-10-CM | POA: Insufficient documentation

## 2023-11-07 LAB — GC/CHLAMYDIA PROBE AMP (~~LOC~~) NOT AT ARMC
Chlamydia: NEGATIVE
Comment: NEGATIVE
Comment: NORMAL
Neisseria Gonorrhea: NEGATIVE

## 2023-11-07 NOTE — Telephone Encounter (Signed)
-----   Message from Nurse Ami SAILOR sent at 10/30/2023  1:50 PM EDT ----- Regarding: f/u prenatal visit follow up with patient after her ob appt. how many weeks? EDD?

## 2023-11-07 NOTE — Telephone Encounter (Signed)
 Attempted to reach patient to follow up after her ob appt. Left voicemail requesting call back.

## 2023-11-07 NOTE — Telephone Encounter (Signed)
 Spoke with patient after she returned call from office. Pt states her OB appointment went well and they told her she was 29 weeks -today she is officially 30 weeks.  Advised patient that Dr. Viktoria  would like to see her for follow up earlier than December. Patient's EDC is October 10 th. Pt was given an appointment for  Friday, November 7th at 3:15 pm. Pt agreed to date and time and is aware to call with any concerns or questions.

## 2023-11-08 LAB — CBC/D/PLT+RPR+RH+ABO+RUBIGG...
Antibody Screen: NEGATIVE
Basophils Absolute: 0.1 x10E3/uL (ref 0.0–0.2)
Basos: 1 %
EOS (ABSOLUTE): 0.1 x10E3/uL (ref 0.0–0.4)
Eos: 1 %
HCV Ab: NONREACTIVE
HIV Screen 4th Generation wRfx: NONREACTIVE
Hematocrit: 36.9 % (ref 34.0–46.6)
Hemoglobin: 11.6 g/dL (ref 11.1–15.9)
Hepatitis B Surface Ag: NEGATIVE
Immature Grans (Abs): 0.2 x10E3/uL — ABNORMAL HIGH (ref 0.0–0.1)
Immature Granulocytes: 1 %
Lymphocytes Absolute: 2 x10E3/uL (ref 0.7–3.1)
Lymphs: 18 %
MCH: 27 pg (ref 26.6–33.0)
MCHC: 31.4 g/dL — ABNORMAL LOW (ref 31.5–35.7)
MCV: 86 fL (ref 79–97)
Monocytes Absolute: 0.8 x10E3/uL (ref 0.1–0.9)
Monocytes: 7 %
Neutrophils Absolute: 8 x10E3/uL — ABNORMAL HIGH (ref 1.4–7.0)
Neutrophils: 72 %
Platelets: 173 x10E3/uL (ref 150–450)
RBC: 4.3 x10E6/uL (ref 3.77–5.28)
RDW: 14.5 % (ref 11.7–15.4)
RPR Ser Ql: REACTIVE — AB
Rh Factor: POSITIVE
Rubella Antibodies, IGG: 0.98 {index} — ABNORMAL LOW (ref >0.99)
WBC: 11.2 x10E3/uL — ABNORMAL HIGH (ref 3.4–10.8)

## 2023-11-08 LAB — COMPREHENSIVE METABOLIC PANEL WITH GFR
ALT: 17 IU/L (ref 0–32)
AST: 15 IU/L (ref 0–40)
Albumin: 3.6 g/dL — ABNORMAL LOW (ref 3.9–4.9)
Alkaline Phosphatase: 110 IU/L (ref 44–121)
BUN/Creatinine Ratio: 11 (ref 9–23)
BUN: 8 mg/dL (ref 6–20)
Bilirubin Total: 0.2 mg/dL (ref 0.0–1.2)
CO2: 18 mmol/L — ABNORMAL LOW (ref 20–29)
Calcium: 9.8 mg/dL (ref 8.7–10.2)
Chloride: 100 mmol/L (ref 96–106)
Creatinine, Ser: 0.72 mg/dL (ref 0.57–1.00)
Globulin, Total: 3 g/dL (ref 1.5–4.5)
Glucose: 80 mg/dL (ref 70–99)
Potassium: 4 mmol/L (ref 3.5–5.2)
Sodium: 133 mmol/L — ABNORMAL LOW (ref 134–144)
Total Protein: 6.6 g/dL (ref 6.0–8.5)
eGFR: 112 mL/min/1.73 (ref >59)

## 2023-11-08 LAB — ANEMIA PROFILE B
Ferritin: 26 ng/mL (ref 15–150)
Folate: >20 ng/mL (ref >3.0)
Iron Saturation: 82 % (ref 15–55)
Iron: 296 ug/dL (ref 27–159)
Retic Ct Pct: 1.6 % (ref 0.6–2.6)
Total Iron Binding Capacity: 363 ug/dL (ref 250–450)
UIBC: 67 ug/dL — ABNORMAL LOW (ref 131–425)
Vitamin B-12: 443 pg/mL (ref 232–1245)

## 2023-11-08 LAB — TSH RFX ON ABNORMAL TO FREE T4: TSH: 1.69 u[IU]/mL (ref 0.450–4.500)

## 2023-11-08 LAB — RPR, QUANT+TP ABS (REFLEX)
Rapid Plasma Reagin, Quant: 1:4 {titer} — ABNORMAL HIGH
T Pallidum Abs: REACTIVE — AB

## 2023-11-08 LAB — PROTEIN / CREATININE RATIO, URINE
Creatinine, Urine: 196.8 mg/dL
Protein, Ur: 29.4 mg/dL
Protein/Creat Ratio: 149 mg/g{creat} (ref 0–200)

## 2023-11-08 LAB — CULTURE, OB URINE

## 2023-11-08 LAB — URINE CULTURE, OB REFLEX

## 2023-11-08 LAB — HEMOGLOBIN A1C
Est. average glucose Bld gHb Est-mCnc: 105 mg/dL
Hgb A1c MFr Bld: 5.3 % (ref 4.8–5.6)

## 2023-11-08 LAB — HCV INTERPRETATION

## 2023-11-09 ENCOUNTER — Encounter: Payer: Self-pay | Admitting: Obstetrics and Gynecology

## 2023-11-09 DIAGNOSIS — O09893 Supervision of other high risk pregnancies, third trimester: Secondary | ICD-10-CM | POA: Insufficient documentation

## 2023-11-09 DIAGNOSIS — Z8619 Personal history of other infectious and parasitic diseases: Secondary | ICD-10-CM | POA: Insufficient documentation

## 2023-11-09 DIAGNOSIS — O98113 Syphilis complicating pregnancy, third trimester: Secondary | ICD-10-CM | POA: Insufficient documentation

## 2023-11-11 ENCOUNTER — Telehealth: Payer: Self-pay | Admitting: Obstetrics and Gynecology

## 2023-11-11 ENCOUNTER — Other Ambulatory Visit: Payer: Self-pay | Admitting: *Deleted

## 2023-11-11 ENCOUNTER — Ambulatory Visit (HOSPITAL_BASED_OUTPATIENT_CLINIC_OR_DEPARTMENT_OTHER): Attending: Obstetrics and Gynecology | Admitting: Maternal & Fetal Medicine

## 2023-11-11 ENCOUNTER — Ambulatory Visit (HOSPITAL_BASED_OUTPATIENT_CLINIC_OR_DEPARTMENT_OTHER)

## 2023-11-11 VITALS — BP 103/62 | HR 91

## 2023-11-11 DIAGNOSIS — Z3687 Encounter for antenatal screening for uncertain dates: Secondary | ICD-10-CM

## 2023-11-11 DIAGNOSIS — Z362 Encounter for other antenatal screening follow-up: Secondary | ICD-10-CM

## 2023-11-11 DIAGNOSIS — Z3493 Encounter for supervision of normal pregnancy, unspecified, third trimester: Secondary | ICD-10-CM

## 2023-11-11 DIAGNOSIS — Z9221 Personal history of antineoplastic chemotherapy: Secondary | ICD-10-CM | POA: Insufficient documentation

## 2023-11-11 DIAGNOSIS — O98113 Syphilis complicating pregnancy, third trimester: Secondary | ICD-10-CM | POA: Insufficient documentation

## 2023-11-11 DIAGNOSIS — O0990 Supervision of high risk pregnancy, unspecified, unspecified trimester: Secondary | ICD-10-CM

## 2023-11-11 DIAGNOSIS — Z3200 Encounter for pregnancy test, result unknown: Secondary | ICD-10-CM

## 2023-11-11 DIAGNOSIS — Z8543 Personal history of malignant neoplasm of ovary: Secondary | ICD-10-CM | POA: Diagnosis not present

## 2023-11-11 DIAGNOSIS — O99213 Obesity complicating pregnancy, third trimester: Secondary | ICD-10-CM | POA: Diagnosis not present

## 2023-11-11 DIAGNOSIS — Z3A30 30 weeks gestation of pregnancy: Secondary | ICD-10-CM

## 2023-11-11 DIAGNOSIS — E669 Obesity, unspecified: Secondary | ICD-10-CM | POA: Diagnosis not present

## 2023-11-11 DIAGNOSIS — O0933 Supervision of pregnancy with insufficient antenatal care, third trimester: Secondary | ICD-10-CM

## 2023-11-11 DIAGNOSIS — A530 Latent syphilis, unspecified as early or late: Secondary | ICD-10-CM | POA: Diagnosis not present

## 2023-11-11 DIAGNOSIS — Z3689 Encounter for other specified antenatal screening: Secondary | ICD-10-CM | POA: Insufficient documentation

## 2023-11-11 DIAGNOSIS — O09893 Supervision of other high risk pregnancies, third trimester: Secondary | ICD-10-CM

## 2023-11-11 DIAGNOSIS — Z3490 Encounter for supervision of normal pregnancy, unspecified, unspecified trimester: Secondary | ICD-10-CM | POA: Insufficient documentation

## 2023-11-11 DIAGNOSIS — O99210 Obesity complicating pregnancy, unspecified trimester: Secondary | ICD-10-CM | POA: Insufficient documentation

## 2023-11-11 NOTE — Progress Notes (Signed)
 Patient information  Patient Name: Brandy Knight  Patient MRN:   [id number]  Referring practice: MFM Referring Provider: [facility])  Problem List   Patient Active Problem List   Diagnosis Date Noted   Pregnancy with uncertain dates 11/11/2023   Obesity affecting pregnancy, antepartum 11/11/2023   History of maternal syphilis, currently pregnant, third trimester 11/09/2023   Family history of seizure in son 11/07/2023   History of chemotherapy 11/06/2023   No prenatal care in current pregnancy 11/06/2023   Anemia, mild 10/17/2023   Obesity, Class II, BMI 35-39.9 10/16/2022   Malignant granulosa cell tumor of ovary, left (HCC) 05/05/2020   Right adnexal mass    Supervision of high risk pregnancy, antepartum 04/17/2013    Maternal Fetal medicine Consult  Brandy Knight is a 33 y.o. G2P1001 at [redacted]w[redacted]d here for ultrasound and consultation. Brandy Knight is doing well today with no acute concerns. Today we focused on the following:   The patient has a history of ovarian cancer and is status post removal of her right adnexa along with chemotherapy.  Recently there was concern that she may have recurrence due to a decreased appetite.  Thankfully this symptom has not resolved and the patient reports that she has a good appetite.  She does not report any early satiety, distention or bloating.  She had a CT scan that showed questionable concern for enlarged left adnexa but this also may be due to pregnancy.  I discussed that while I am not a cancer expert her ovary does appear within normal limits for pregnancy.  I encouraged her to continue to monitor her symptoms and follow-up with her gynecology oncologist after pregnancy or sooner if she has any concerns.  The patient also has syphilis.  I discussed that since she has had potential reexposure with her partner and incomplete treatment latent syphilis but only receiving 2 doses of penicillin that she should start  her course over and recommend her partner also be tested and treated if necessary.  We discussed the potential for congenital syphilis is low but possible especially with repeat exposure during pregnancy.  Sonographic findings Single intrauterine pregnancy at 30w 3d  Fetal cardiac activity:  Observed and appears normal. Presentation: Cephalic. The anatomic structures that were well seen appear normal without evidence of soft markers. Due to poor acoustic windows some structures remain suboptimally visualized. Fetal biometry shows the estimated fetal weight at the 11 percentile.  Amniotic fluid: Within normal limits.  MVP: 5.35 cm. Placenta: Posterior. Adnexa: No abnormality visualized. Cervical length: 3.7 cm.  There are limitations of prenatal ultrasound such as the inability to detect certain abnormalities due to poor visualization. Various factors such as fetal position, gestational age and maternal body habitus may increase the difficulty in visualizing the fetal anatomy.    Recommendations - EDD should be 01/17/2024 based on  Previous Ultrasound  (11/06/23). 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  Review of Systems: A review of systems was performed and was negative except per HPI   Vitals and Physical Exam    11/11/2023   10:41 AM 11/06/2023    3:54 PM 11/05/2023    2:00 PM  Vitals with BMI  Height   5' 2  Weight  181 lbs   BMI  33.1   Systolic 103  116   Diastolic 62 67   Pulse 91 84     Sitting comfortably on the sonogram table Nonlabored breathing Normal rate and rhythm Abdomen is nontender  Past pregnancies OB History  Gravida Para Term Preterm AB Living  2 1 1   1   SAB IAB Ectopic Multiple Live Births      1    # Outcome Date GA Lbr Len/2nd Weight Sex Type Anes PTL Lv  2 Current           1 Term 09/16/13 [redacted]w[redacted]d 07:37 /  00:38 6 lb 7.9 oz (2.945 kg) M Vag-Spont None  LIV     I spent 45 minutes reviewing the patients chart, including labs and images as well as counseling the patient about her medical conditions. Greater than 50% of the time was spent in direct face-to-face patient counseling.  Delora Smaller  MFM, [facility]   11/11/2023  11:49 AM

## 2023-11-11 NOTE — Telephone Encounter (Signed)
 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 She's not sure if she was to get repeat blood testing but didn't get any bloodwork after the treatment. She states her only sexual contact is the baby's father but she doesn't believe that he's ever been tested.  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   I confirmed with her her mfm u/s for later this morning and I told her that they may discuss whether they think she should be treated too   [name], Mickey MD Attending Center for [employer] (Faculty Practice) 11/11/2023 Time: 0830

## 2023-11-13 ENCOUNTER — Telehealth: Payer: Self-pay

## 2023-11-13 NOTE — Telephone Encounter (Addendum)
-----   Message from [place] sent at 11/12/2023  9:56 AM EDT ----- Regarding: needs PCN treatment for pregnancy I talked to the patient about the positive test. She said that she was treated last year but since she's still positive, I told her I would talk to MFM about re-treatment.  They agree that she needs re-treatment.   Please call her and let her know that it was decided that she needs re-treatment: PCN 2.4 million units IM qwk x 3 weeks   Pt notiifed of provider's recommendation and the need to have weekly injections for 3 weeks.  Pt agreed to 11/18/23, 11/25/23, and 11/25/23.    Kemari Narez, RN

## 2023-11-14 LAB — PANORAMA PRENATAL TEST FULL PANEL:PANORAMA TEST PLUS 5 ADDITIONAL MICRODELETIONS: FETAL FRACTION: 13.5

## 2023-11-18 ENCOUNTER — Other Ambulatory Visit: Payer: Self-pay

## 2023-11-18 ENCOUNTER — Ambulatory Visit (INDEPENDENT_AMBULATORY_CARE_PROVIDER_SITE_OTHER)

## 2023-11-18 VITALS — BP 121/60 | HR 97 | Wt 183.7 lb

## 2023-11-18 DIAGNOSIS — A539 Syphilis, unspecified: Secondary | ICD-10-CM

## 2023-11-18 DIAGNOSIS — O09893 Supervision of other high risk pregnancies, third trimester: Secondary | ICD-10-CM

## 2023-11-18 DIAGNOSIS — O0990 Supervision of high risk pregnancy, unspecified, unspecified trimester: Secondary | ICD-10-CM

## 2023-11-18 DIAGNOSIS — Z3A31 31 weeks gestation of pregnancy: Secondary | ICD-10-CM | POA: Diagnosis not present

## 2023-11-18 MED ORDER — PENICILLIN G BENZATHINE 1200000 UNIT/2ML IM SUSY
2.4000 10*6.[IU] | PREFILLED_SYRINGE | Freq: Once | INTRAMUSCULAR | Status: AC
  Administered 2023-11-18 (×2): 2.4 10*6.[IU] via INTRAMUSCULAR

## 2023-11-18 NOTE — Progress Notes (Signed)
 Pt here today for Bicillin  injection for reactive RPR.  2.4 million units IM administered without issue.  Pt understanding of need for treatment weekly for three weeks per Dr. Izell.  Next two treatment appointments scheduled for 11/25/23 and 12/02/23.    [place], RN

## 2023-11-19 ENCOUNTER — Other Ambulatory Visit: Payer: Self-pay

## 2023-11-19 DIAGNOSIS — Z3A32 32 weeks gestation of pregnancy: Secondary | ICD-10-CM

## 2023-11-19 LAB — HORIZON CUSTOM: REPORT SUMMARY: POSITIVE — AB

## 2023-11-21 ENCOUNTER — Encounter: Payer: Self-pay | Admitting: Lactation Services

## 2023-11-21 ENCOUNTER — Other Ambulatory Visit

## 2023-11-21 ENCOUNTER — Ambulatory Visit: Admitting: Obstetrics and Gynecology

## 2023-11-21 ENCOUNTER — Ambulatory Visit: Payer: Self-pay | Admitting: Obstetrics and Gynecology

## 2023-11-21 ENCOUNTER — Other Ambulatory Visit: Payer: Self-pay

## 2023-11-21 ENCOUNTER — Encounter: Payer: Self-pay | Admitting: Obstetrics and Gynecology

## 2023-11-21 VITALS — BP 103/67 | HR 97 | Wt 184.2 lb

## 2023-11-21 DIAGNOSIS — E66812 Obesity, class 2: Secondary | ICD-10-CM

## 2023-11-21 DIAGNOSIS — Z3A31 31 weeks gestation of pregnancy: Secondary | ICD-10-CM | POA: Diagnosis not present

## 2023-11-21 DIAGNOSIS — Z3A32 32 weeks gestation of pregnancy: Secondary | ICD-10-CM

## 2023-11-21 DIAGNOSIS — D563 Thalassemia minor: Secondary | ICD-10-CM | POA: Insufficient documentation

## 2023-11-21 DIAGNOSIS — N9489 Other specified conditions associated with female genital organs and menstrual cycle: Secondary | ICD-10-CM | POA: Diagnosis not present

## 2023-11-21 DIAGNOSIS — O99210 Obesity complicating pregnancy, unspecified trimester: Secondary | ICD-10-CM

## 2023-11-21 DIAGNOSIS — Z820 Family history of epilepsy and other diseases of the nervous system: Secondary | ICD-10-CM

## 2023-11-21 DIAGNOSIS — O09893 Supervision of other high risk pregnancies, third trimester: Secondary | ICD-10-CM

## 2023-11-21 DIAGNOSIS — D649 Anemia, unspecified: Secondary | ICD-10-CM

## 2023-11-21 DIAGNOSIS — C562 Malignant neoplasm of left ovary: Secondary | ICD-10-CM

## 2023-11-21 DIAGNOSIS — O0993 Supervision of high risk pregnancy, unspecified, third trimester: Secondary | ICD-10-CM

## 2023-11-21 DIAGNOSIS — Z9221 Personal history of antineoplastic chemotherapy: Secondary | ICD-10-CM

## 2023-11-21 DIAGNOSIS — O99213 Obesity complicating pregnancy, third trimester: Secondary | ICD-10-CM

## 2023-11-21 DIAGNOSIS — O0990 Supervision of high risk pregnancy, unspecified, unspecified trimester: Secondary | ICD-10-CM

## 2023-11-21 NOTE — Progress Notes (Signed)
 PRENATAL VISIT NOTE  Subjective:  Brandy Knight is a 33 y.o. G2P1001 at [redacted]w[redacted]d being seen today for ongoing prenatal care.  She is currently monitored for the following issues for this high-risk pregnancy and has Supervision of high risk pregnancy, antepartum; Right adnexal mass; Malignant granulosa cell tumor of ovary, left (HCC); Obesity, Class II, BMI 35-39.9; Anemia, mild; History of chemotherapy; Late prenatal care affecting pregnancy in third trimester; Family history of seizure in son; History of maternal syphilis, currently pregnant, third trimester; and Obesity affecting pregnancy, antepartum on their problem list.  Patient reports no complaints.  Contractions: Not present. Vag. Bleeding: None.  Movement: Present. Denies leaking of fluid.   The following portions of the patient's history were reviewed and updated as appropriate: allergies, current medications, past family history, past medical history, past social history, past surgical history and problem list.   Objective:    Vitals:   11/21/23 0916  BP: 103/67  Pulse: 97  Weight: 184 lb 3.2 oz (83.6 kg)    Fetal Status:  Fetal Heart Rate (bpm): 136   Movement: Present    General: Alert, oriented and cooperative. Patient is in no acute distress.  Skin: Skin is warm and dry. No rash noted.   Cardiovascular: Normal heart rate noted  Respiratory: Normal respiratory effort, no problems with respiration noted  Abdomen: Soft, gravid, appropriate for gestational age.  Pain/Pressure: Absent     Pelvic: Cervical exam deferred        Extremities: Normal range of motion.  Edema: None  Mental Status: Normal mood and affect. Normal behavior. Normal judgment and thought content.   Assessment and Plan:  Pregnancy: G2P1001 at [redacted]w[redacted]d 1. [redacted] weeks gestation of pregnancy (Primary) Nexplanon. GTT today  2. Supervision of high risk pregnancy, antepartum  3. Malignant granulosa cell tumor of ovary, left (HCC) Followed regularly by Gyn  Onc (Dr. Tucker) and Heme. Plan to follow up later this year with her. Will CC both on note -S/p 04/2020 RA-LSO & mini-lap with stage IC1 Granulosa Cell Tumor and s/p carbo/taxel x 6, last dose 10/2020.   4. Right adnexal mass Seen at CT scan in July but not in August. Consider delivery at [facility] if patient needs c-section pre-labor, such as for breech  5. Obesity, Class II, BMI 35-39.9  6. Obesity affecting pregnancy, antepartum, unspecified obesity type  7. History of maternal syphilis, currently pregnant, third trimester S/p PCN #1 on 8/11. Patient aware of two other doses  8. History of chemotherapy  9. Family history of seizure in son Patient still unsure. She gave me permission to look in her son's chart ([id number]). It appears he has chromosome 3 duplication   10. Anemia, mild Resolved. F/u CBC for today  Preterm labor symptoms and general obstetric precautions including but not limited to vaginal bleeding, contractions, leaking of fluid and fetal movement were reviewed in detail with the patient. Please refer to After Visit Summary for other counseling recommendations.   Return in 15 days (on 12/06/2023) for in person, high risk ob, md visit.  Future Appointments  Date Time Provider Department Center  11/21/2023 11:15 AM Izell Harari, MD [facility]  11/25/2023  3:20 PM [facility] NURSE [facility]  12/02/2023  3:40 PM [facility] NURSE [facility]  12/06/2023  9:00 AM [facility] PROVIDER 1 [facility]  12/06/2023  9:30 AM [facility] US1 [facility]  02/14/2024  3:15 PM Viktoria Comer JONELLE, MD [facility] None  03/13/2024  1:15 PM [facility] LAB [facility] None  [name], MD

## 2023-11-22 LAB — GLUCOSE TOLERANCE, 2 HOURS W/ 1HR
Glucose, 1 hour: 148 mg/dL (ref 70–179)
Glucose, 2 hour: 122 mg/dL (ref 70–152)
Glucose, Fasting: 74 mg/dL (ref 70–91)

## 2023-11-22 LAB — RPR, QUANT+TP ABS (REFLEX)
Rapid Plasma Reagin, Quant: 1:2 {titer} — ABNORMAL HIGH
T Pallidum Abs: REACTIVE — AB

## 2023-11-22 LAB — CBC
Hematocrit: 32.9 % — ABNORMAL LOW (ref 34.0–46.6)
Hemoglobin: 10.3 g/dL — ABNORMAL LOW (ref 11.1–15.9)
MCH: 26.7 pg (ref 26.6–33.0)
MCHC: 31.3 g/dL — ABNORMAL LOW (ref 31.5–35.7)
MCV: 85 fL (ref 79–97)
Platelets: 144 x10E3/uL — ABNORMAL LOW (ref 150–450)
RBC: 3.86 x10E6/uL (ref 3.77–5.28)
RDW: 13.6 % (ref 11.7–15.4)
WBC: 11.3 x10E3/uL — ABNORMAL HIGH (ref 3.4–10.8)

## 2023-11-22 LAB — RPR: RPR Ser Ql: REACTIVE — AB

## 2023-11-22 LAB — HIV ANTIBODY (ROUTINE TESTING W REFLEX): HIV Screen 4th Generation wRfx: NONREACTIVE

## 2023-11-25 ENCOUNTER — Other Ambulatory Visit: Payer: Self-pay

## 2023-11-25 ENCOUNTER — Ambulatory Visit: Payer: Self-pay | Admitting: Obstetrics and Gynecology

## 2023-11-25 ENCOUNTER — Ambulatory Visit: Payer: Self-pay

## 2023-11-25 ENCOUNTER — Encounter: Payer: Self-pay | Admitting: *Deleted

## 2023-11-25 VITALS — BP 97/57 | HR 87 | Wt 181.0 lb

## 2023-11-25 DIAGNOSIS — Z3A32 32 weeks gestation of pregnancy: Secondary | ICD-10-CM

## 2023-11-25 DIAGNOSIS — O09893 Supervision of other high risk pregnancies, third trimester: Secondary | ICD-10-CM | POA: Diagnosis not present

## 2023-11-25 DIAGNOSIS — O0990 Supervision of high risk pregnancy, unspecified, unspecified trimester: Secondary | ICD-10-CM

## 2023-11-25 DIAGNOSIS — A539 Syphilis, unspecified: Secondary | ICD-10-CM

## 2023-11-25 DIAGNOSIS — D696 Thrombocytopenia, unspecified: Secondary | ICD-10-CM | POA: Insufficient documentation

## 2023-11-25 MED ORDER — PENICILLIN G BENZATHINE 1200000 UNIT/2ML IM SUSY
2.4000 10*6.[IU] | PREFILLED_SYRINGE | Freq: Once | INTRAMUSCULAR | Status: AC
  Administered 2023-11-25: 2.4 10*6.[IU] via INTRAMUSCULAR

## 2023-11-25 NOTE — Progress Notes (Signed)
 Patient here today for her second dose of Bicillin  for reactive RPR.  2.4 million units administered IM without issue.  Patient is aware of next appointment for the third dose of treatment on 12/02/23.   Rosina, RN

## 2023-12-02 ENCOUNTER — Ambulatory Visit: Payer: Self-pay

## 2023-12-03 ENCOUNTER — Other Ambulatory Visit: Payer: Self-pay

## 2023-12-03 ENCOUNTER — Ambulatory Visit

## 2023-12-03 ENCOUNTER — Inpatient Hospital Stay (HOSPITAL_COMMUNITY)
Admission: AD | Admit: 2023-12-03 | Discharge: 2023-12-03 | Disposition: A | Discharge status: Home / Self Care | Attending: Family Medicine | Admitting: Family Medicine

## 2023-12-03 DIAGNOSIS — O98113 Syphilis complicating pregnancy, third trimester: Secondary | ICD-10-CM

## 2023-12-03 DIAGNOSIS — Z3A33 33 weeks gestation of pregnancy: Secondary | ICD-10-CM

## 2023-12-03 DIAGNOSIS — O09893 Supervision of other high risk pregnancies, third trimester: Secondary | ICD-10-CM

## 2023-12-03 MED ORDER — PENICILLIN G BENZATHINE 1200000 UNIT/2ML IM SUSY
2.4000 10*6.[IU] | PREFILLED_SYRINGE | Freq: Once | INTRAMUSCULAR | Status: AC
  Administered 2023-12-03: 2.4 10*6.[IU] via INTRAMUSCULAR
  Filled 2023-12-03: qty 4

## 2023-12-03 NOTE — MAU Provider Note (Signed)
 S Ms. Brandy Knight is a 33 y.o. G2P1001 female who presents to MAU today with complaint of needing PCN shot, clinic [facility]) was out of medication. This is set #3 of syphilis treatment.   ROS: Negative  O BP (!) 107/58 (BP Location: Right Arm)   Pulse 85   Temp 98.3 F (36.8 C) (Oral)   Resp 16   Ht 5' 2 (1.575 m)   Wt 83 kg   LMP 05/11/2023 (Approximate)   SpO2 100%   BMI 33.47 kg/m  Physical Exam Vitals and nursing note reviewed.  Constitutional:      Appearance: Normal appearance.  HENT:     Head: Normocephalic and atraumatic.     Nose: Nose normal.     Mouth/Throat:     Mouth: Mucous membranes are moist.  Eyes:     Conjunctiva/sclera: Conjunctivae normal.  Cardiovascular:     Rate and Rhythm: Normal rate.  Pulmonary:     Effort: Pulmonary effort is normal.  Abdominal:     General: Abdomen is flat.     Palpations: Abdomen is soft.  Musculoskeletal:     Cervical back: Normal range of motion.  Skin:    General: Skin is warm.     Capillary Refill: Capillary refill takes less than 2 seconds.  Neurological:     General: No focal deficit present.     Mental Status: She is alert.  Psychiatric:        Mood and Affect: Mood normal.    MDM: low  A PCN#3 2.81mu IM  P Discharge from MAU in stable condition Patient may return to MAU as needed for pregnancy related complaints  Eldonna Suzen Octave, MD 12/03/2023 6:19 PM

## 2023-12-03 NOTE — MAU Note (Signed)
 Brandy Knight is a 33 y.o. at [redacted]w[redacted]d here in MAU reporting: sent from office for Syphilis medication. Dr Eldonna aware of arrival. NO c/o SROM, vaginal bleeding, contractions, HA, chest pain, or visual disturbances.  +FM  Onset of complaint: in office Pain score: 0/10 Vitals:   12/03/23 1809  BP: (!) 107/58  Pulse: 85  Resp: 16  Temp: 98.3 F (36.8 C)  SpO2: 100%     FHT: 139  Lab orders placed from triage:by md

## 2023-12-03 NOTE — Discharge Instructions (Signed)
 You were seen for your IM penicillin  shots You were given your third and final dose.

## 2023-12-03 NOTE — Progress Notes (Signed)
 Brandy Knight here for bicillin  treatment for positive RPR. The office is currently out of Bicillin . Per recommendation of Dr. Lola, pt was advised to go to [facility] Assessment Unit to receive Bicillin . Pt agreeable to plan and had no further questions or concrens. RN contacted Dr. Eldonna to let her know pt was coming over from the office.   Cyndee Molt, RN

## 2023-12-05 ENCOUNTER — Ambulatory Visit (INDEPENDENT_AMBULATORY_CARE_PROVIDER_SITE_OTHER): Admitting: Family Medicine

## 2023-12-05 ENCOUNTER — Encounter: Payer: Self-pay | Admitting: Family Medicine

## 2023-12-05 ENCOUNTER — Encounter: Admitting: Obstetrics and Gynecology

## 2023-12-05 ENCOUNTER — Other Ambulatory Visit: Payer: Self-pay

## 2023-12-05 VITALS — BP 95/60 | HR 102 | Wt 177.2 lb

## 2023-12-05 DIAGNOSIS — Z3A33 33 weeks gestation of pregnancy: Secondary | ICD-10-CM

## 2023-12-05 DIAGNOSIS — O99213 Obesity complicating pregnancy, third trimester: Secondary | ICD-10-CM

## 2023-12-05 DIAGNOSIS — Z820 Family history of epilepsy and other diseases of the nervous system: Secondary | ICD-10-CM

## 2023-12-05 DIAGNOSIS — O0990 Supervision of high risk pregnancy, unspecified, unspecified trimester: Secondary | ICD-10-CM

## 2023-12-05 DIAGNOSIS — C562 Malignant neoplasm of left ovary: Secondary | ICD-10-CM

## 2023-12-05 DIAGNOSIS — N9489 Other specified conditions associated with female genital organs and menstrual cycle: Secondary | ICD-10-CM

## 2023-12-05 DIAGNOSIS — D696 Thrombocytopenia, unspecified: Secondary | ICD-10-CM

## 2023-12-05 DIAGNOSIS — O99210 Obesity complicating pregnancy, unspecified trimester: Secondary | ICD-10-CM

## 2023-12-05 DIAGNOSIS — O0993 Supervision of high risk pregnancy, unspecified, third trimester: Secondary | ICD-10-CM | POA: Diagnosis not present

## 2023-12-05 DIAGNOSIS — O09893 Supervision of other high risk pregnancies, third trimester: Secondary | ICD-10-CM

## 2023-12-05 DIAGNOSIS — O99113 Other diseases of the blood and blood-forming organs and certain disorders involving the immune mechanism complicating pregnancy, third trimester: Secondary | ICD-10-CM

## 2023-12-05 DIAGNOSIS — D649 Anemia, unspecified: Secondary | ICD-10-CM

## 2023-12-05 NOTE — Patient Instructions (Signed)

## 2023-12-05 NOTE — Progress Notes (Signed)
   Subjective:  Brandy Knight is a 33 y.o. G2P1001 at [redacted]w[redacted]d being seen today for ongoing prenatal care.  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  Patient reports no complaints.  Contractions: Not present. Vag. Bleeding: None.  Movement: Present. Denies leaking of fluid.   The following portions of the patient's history were reviewed and updated as appropriate: allergies, current medications, past family history, past medical history, past social history, past surgical history and problem list. Problem list updated.  Objective:   Vitals:   12/05/23 1620  BP: 95/60  Pulse: (!) 102  Weight: 177 lb 3.2 oz (80.4 kg)    Fetal Status: Fetal Heart Rate (bpm): 135   Movement: Present     General:  Alert, oriented and cooperative. Patient is in no acute distress.  Skin: Skin is warm and dry. No rash noted.   Cardiovascular: Normal heart rate noted  Respiratory: Normal respiratory effort, no problems with respiration noted  Abdomen: Soft, gravid, appropriate for gestational age. Pain/Pressure: Present     Pelvic: Vag. Bleeding: None     Cervical exam deferred        Extremities: Normal range of motion.  Edema: None  Mental Status: Normal mood and affect. Normal behavior. Normal judgment and thought content.   Urinalysis:      Assessment and Plan:  Pregnancy: G2P1001 at [redacted]w[redacted]d  1. Supervision of high risk pregnancy, antepartum (Primary) BP and FHR  normal  2. 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 Right adnexal mass Mild enlargement, per MFM expected size for pregnancy, has f/u growth scan scheduled for tomorrow  4. Obesity affecting pregnancy, antepartum, unspecified obesity type   5. Malignant granulosa cell tumor of ovary, left (HCC) S/p resection and chemotherapy in 2022, f/w Gyn Onc  6. History of maternal syphilis, currently pregnant, third trimester S/p bicillin  x3 in this pregnancy  7. Benign gestational thrombocytopenia in third trimester [facility]) Lab Results  Component Value Date   PLT 144 (L) 11/21/2023   mild  8. Anemia, mild Lab Results  Component Value Date   HGB 10.3 (L) 11/21/2023     Preterm labor symptoms and general obstetric precautions including but not limited to vaginal bleeding, contractions, leaking of fluid and fetal movement were reviewed in detail with the patient. Please refer to After Visit Summary for other counseling recommendations.  Return in 2 weeks (on 12/19/2023) for [facility], ob visit.   Lola Donnice HERO, MD

## 2023-12-06 ENCOUNTER — Ambulatory Visit: Attending: Obstetrics and Gynecology

## 2023-12-06 ENCOUNTER — Ambulatory Visit: Admitting: Maternal & Fetal Medicine

## 2023-12-06 ENCOUNTER — Other Ambulatory Visit: Payer: Self-pay | Admitting: *Deleted

## 2023-12-06 VITALS — BP 113/57 | HR 90

## 2023-12-06 DIAGNOSIS — O0933 Supervision of pregnancy with insufficient antenatal care, third trimester: Secondary | ICD-10-CM

## 2023-12-06 DIAGNOSIS — Z8543 Personal history of malignant neoplasm of ovary: Secondary | ICD-10-CM | POA: Diagnosis not present

## 2023-12-06 DIAGNOSIS — Z3687 Encounter for antenatal screening for uncertain dates: Secondary | ICD-10-CM | POA: Diagnosis not present

## 2023-12-06 DIAGNOSIS — O0990 Supervision of high risk pregnancy, unspecified, unspecified trimester: Secondary | ICD-10-CM | POA: Diagnosis present

## 2023-12-06 DIAGNOSIS — Z362 Encounter for other antenatal screening follow-up: Secondary | ICD-10-CM | POA: Insufficient documentation

## 2023-12-06 DIAGNOSIS — O99213 Obesity complicating pregnancy, third trimester: Secondary | ICD-10-CM | POA: Diagnosis not present

## 2023-12-06 DIAGNOSIS — E669 Obesity, unspecified: Secondary | ICD-10-CM | POA: Diagnosis not present

## 2023-12-06 DIAGNOSIS — Z8619 Personal history of other infectious and parasitic diseases: Secondary | ICD-10-CM

## 2023-12-06 DIAGNOSIS — Z3A34 34 weeks gestation of pregnancy: Secondary | ICD-10-CM

## 2023-12-06 DIAGNOSIS — O99210 Obesity complicating pregnancy, unspecified trimester: Secondary | ICD-10-CM

## 2023-12-06 NOTE — Progress Notes (Signed)
   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 G2P1001 at [redacted]w[redacted]d here for ultrasound and consultation. Brandy Knight is doing well today with no acute concerns. Today we focused on the following:   The patient has a pregnancy complicated by late prenatal care and a hx of ovarian cancer. She also was told to take three doses of PCN due to her hx of syphilis.   The patient had time to ask questions that were answered to her satisfaction.  She verbalized understanding and agrees to proceed with the plan below.  Single intrauterine pregnancy at 34w 0d.  Fetal cardiac activity:  Observed and appears normal. Presentation: Cephalic. Interval fetal anatomy appears normal. Fetal biometry shows the estimated fetal weight at the 15 percentile. Amniotic fluid volume: Within normal limits. MVP: 4.98 cm. Placenta: Posterior.  There are limitations of prenatal ultrasound such as the inability to detect certain abnormalities due to poor visualization. Various factors such as fetal position, gestational age and maternal body habitus may increase  the difficulty in visualizing the fetal anatomy.    Recommendations -Serial growth ultrasounds every 4-6 weeks until delivery  Review of Systems: A review of systems was performed and was negative except per HPI   Vitals and Physical Exam    12/06/2023    8:56 AM 12/05/2023    4:20 PM 12/03/2023    6:09 PM  Vitals with BMI  Height   5' 2  Weight  177 lbs 3 oz 183 lbs  BMI  32.4 33.46  Systolic 113 95 107  Diastolic 57 60 58  Pulse 90 102 85    Sitting comfortably on the sonogram table Nonlabored breathing Normal rate and rhythm Abdomen is nontender  Past pregnancies OB History  Gravida Para Term Preterm AB Living  2 1 1   1   SAB IAB Ectopic Multiple Live Births      1    # Outcome Date GA Lbr Len/2nd Weight Sex Type Anes PTL Lv  2 Current           1 Term 09/16/13 [redacted]w[redacted]d 07:37 / 00:38 6 lb 7.9 oz (2.945 kg) M Vag-Spont None  LIV     I spent 20 minutes reviewing the patients chart, including labs and images as well as counseling the patient about her medical conditions. Greater than 50% of the time was spent in direct face-to-face patient counseling.  Delora Smaller  MFM, [facility]   12/06/2023  10:24 AM

## 2023-12-18 ENCOUNTER — Ambulatory Visit: Admitting: Family Medicine

## 2023-12-18 ENCOUNTER — Other Ambulatory Visit: Payer: Self-pay

## 2023-12-18 VITALS — BP 102/68 | HR 96 | Wt 178.0 lb

## 2023-12-18 DIAGNOSIS — D649 Anemia, unspecified: Secondary | ICD-10-CM

## 2023-12-18 DIAGNOSIS — O99113 Other diseases of the blood and blood-forming organs and certain disorders involving the immune mechanism complicating pregnancy, third trimester: Secondary | ICD-10-CM

## 2023-12-18 DIAGNOSIS — Z820 Family history of epilepsy and other diseases of the nervous system: Secondary | ICD-10-CM | POA: Diagnosis not present

## 2023-12-18 DIAGNOSIS — O09893 Supervision of other high risk pregnancies, third trimester: Secondary | ICD-10-CM

## 2023-12-18 DIAGNOSIS — Z3A35 35 weeks gestation of pregnancy: Secondary | ICD-10-CM

## 2023-12-18 DIAGNOSIS — O0993 Supervision of high risk pregnancy, unspecified, third trimester: Secondary | ICD-10-CM

## 2023-12-18 DIAGNOSIS — Z3A36 36 weeks gestation of pregnancy: Secondary | ICD-10-CM | POA: Diagnosis not present

## 2023-12-18 DIAGNOSIS — N9489 Other specified conditions associated with female genital organs and menstrual cycle: Secondary | ICD-10-CM

## 2023-12-18 DIAGNOSIS — D696 Thrombocytopenia, unspecified: Secondary | ICD-10-CM

## 2023-12-18 DIAGNOSIS — O99210 Obesity complicating pregnancy, unspecified trimester: Secondary | ICD-10-CM

## 2023-12-18 DIAGNOSIS — O0990 Supervision of high risk pregnancy, unspecified, unspecified trimester: Secondary | ICD-10-CM

## 2023-12-18 DIAGNOSIS — C562 Malignant neoplasm of left ovary: Secondary | ICD-10-CM

## 2023-12-19 LAB — ANEMIA PROFILE B
Basophils Absolute: 0.1 x10E3/uL (ref 0.0–0.2)
Basos: 1 %
EOS (ABSOLUTE): 0.1 x10E3/uL (ref 0.0–0.4)
Eos: 1 %
Ferritin: 24 ng/mL (ref 15–150)
Folate: 13.9 ng/mL (ref >3.0)
Hematocrit: 31.9 % — ABNORMAL LOW (ref 34.0–46.6)
Hemoglobin: 10.3 g/dL — ABNORMAL LOW (ref 11.1–15.9)
Immature Grans (Abs): 0.2 x10E3/uL — ABNORMAL HIGH (ref 0.0–0.1)
Immature Granulocytes: 2 %
Iron Saturation: 35 % (ref 15–55)
Iron: 115 ug/dL (ref 27–159)
Lymphocytes Absolute: 2 x10E3/uL (ref 0.7–3.1)
Lymphs: 18 %
MCH: 27.2 pg (ref 26.6–33.0)
MCHC: 32.3 g/dL (ref 31.5–35.7)
MCV: 84 fL (ref 79–97)
Monocytes Absolute: 0.9 x10E3/uL (ref 0.1–0.9)
Monocytes: 8 %
Neutrophils Absolute: 7.7 x10E3/uL — ABNORMAL HIGH (ref 1.4–7.0)
Neutrophils: 70 %
Platelets: 165 x10E3/uL (ref 150–450)
RBC: 3.78 x10E6/uL (ref 3.77–5.28)
RDW: 13.7 % (ref 11.7–15.4)
Retic Ct Pct: 1.7 % (ref 0.6–2.6)
Total Iron Binding Capacity: 329 ug/dL (ref 250–450)
UIBC: 214 ug/dL (ref 131–425)
Vitamin B-12: 353 pg/mL (ref 232–1245)
WBC: 11 x10E3/uL — ABNORMAL HIGH (ref 3.4–10.8)

## 2023-12-20 NOTE — Progress Notes (Signed)
   PRENATAL VISIT NOTE  Subjective:  Brandy Knight is a 33 y.o. G2P1001 at [redacted]w[redacted]d being seen today for ongoing prenatal care.  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  Patient reports no bleeding, no contractions, no cramping, and no leaking.  Contractions: Not present. Vag. Bleeding: None.  Movement: Present. Denies leaking of fluid.   The following portions of the patient's history were reviewed and updated as appropriate: allergies, current medications, past family history, past medical history, past social history, past surgical history and problem list.   Objective:    Vitals:   12/18/23 1624  BP: 102/68  Pulse: 96  Weight: 178 lb (80.7 kg)    Fetal Status:  Fetal Heart Rate (bpm): 151   Movement: Present    General: Alert, oriented and cooperative. Patient is in no acute distress.  Skin: Skin is warm and dry. No rash noted.   Cardiovascular: Normal heart rate noted  Respiratory: Normal respiratory effort, no problems with respiration noted  Abdomen: Soft, gravid, appropriate for gestational age.  Pain/Pressure: Absent     Pelvic: Cervical exam deferred        Extremities: Normal range of motion.  Edema: None  Mental Status: Normal mood and affect. Normal behavior. Normal judgment and thought content.   Assessment and Plan:  Pregnancy: G2P1001 at [redacted]w[redacted]d 1. Supervision of high risk pregnancy, antepartum FHR and BP were appropriate  today  2. Anemia, mild Anemia panel collected today - Anemia Profile B  3. 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 Right adnexal mass Seen on CT scan in July.  Not seen on imaging in the August.  Per chart review there is consideration that if she needs a C-section such as if fetus is breech [facility] so they can evaluate during the procedure.  5. Obesity affecting pregnancy, antepartum, unspecified obesity type  6. Malignant granulosa cell tumor of ovary, left [facility]) S/p resection and chemotherapy in 2022 Follows with gynecology  7. History of maternal syphilis, currently pregnant, third trimester S/p Bicillin  x 3  8. Benign gestational thrombocytopenia in third trimester (HCC)  9. [redacted] weeks gestation of pregnancy (Primary)  Preterm labor symptoms and general obstetric precautions including but not limited to vaginal bleeding, contractions, leaking of fluid and fetal movement were reviewed in detail with the patient. Please refer to After Visit Summary for other counseling recommendations.   No follow-ups on file.  Future Appointments  Date Time Provider Department Center  12/25/2023  4:15 PM Ilean Norleen GAILS, MD [facility]  12/30/2023  9:15 AM [facility] PROVIDER 1 [facility]  12/30/2023  9:30 AM [facility] US5 [facility]  02/14/2024  3:15 PM Viktoria Comer JONELLE, MD [facility] None  03/13/2024  1:15 PM [facility] LAB [facility] None    Norleen GAILS Ilean, MD

## 2023-12-23 ENCOUNTER — Other Ambulatory Visit

## 2023-12-23 ENCOUNTER — Ambulatory Visit

## 2023-12-25 ENCOUNTER — Other Ambulatory Visit (HOSPITAL_COMMUNITY)
Admission: RE | Admit: 2023-12-25 | Discharge: 2023-12-25 | Disposition: A | Discharge status: Home / Self Care | Source: Ambulatory Visit | Attending: Family Medicine | Admitting: Family Medicine

## 2023-12-25 ENCOUNTER — Ambulatory Visit (INDEPENDENT_AMBULATORY_CARE_PROVIDER_SITE_OTHER): Admitting: Family Medicine

## 2023-12-25 VITALS — BP 86/54 | HR 104 | Wt 179.1 lb

## 2023-12-25 DIAGNOSIS — Z820 Family history of epilepsy and other diseases of the nervous system: Secondary | ICD-10-CM

## 2023-12-25 DIAGNOSIS — Z3A36 36 weeks gestation of pregnancy: Secondary | ICD-10-CM | POA: Insufficient documentation

## 2023-12-25 DIAGNOSIS — D649 Anemia, unspecified: Secondary | ICD-10-CM | POA: Diagnosis not present

## 2023-12-25 DIAGNOSIS — O0990 Supervision of high risk pregnancy, unspecified, unspecified trimester: Secondary | ICD-10-CM

## 2023-12-25 DIAGNOSIS — O0993 Supervision of high risk pregnancy, unspecified, third trimester: Secondary | ICD-10-CM | POA: Diagnosis not present

## 2023-12-25 DIAGNOSIS — Z3483 Encounter for supervision of other normal pregnancy, third trimester: Secondary | ICD-10-CM | POA: Diagnosis present

## 2023-12-25 DIAGNOSIS — O99213 Obesity complicating pregnancy, third trimester: Secondary | ICD-10-CM

## 2023-12-25 DIAGNOSIS — O99113 Other diseases of the blood and blood-forming organs and certain disorders involving the immune mechanism complicating pregnancy, third trimester: Secondary | ICD-10-CM

## 2023-12-25 DIAGNOSIS — C562 Malignant neoplasm of left ovary: Secondary | ICD-10-CM

## 2023-12-25 DIAGNOSIS — D696 Thrombocytopenia, unspecified: Secondary | ICD-10-CM

## 2023-12-25 DIAGNOSIS — O99210 Obesity complicating pregnancy, unspecified trimester: Secondary | ICD-10-CM

## 2023-12-25 DIAGNOSIS — N9489 Other specified conditions associated with female genital organs and menstrual cycle: Secondary | ICD-10-CM

## 2023-12-25 DIAGNOSIS — O09893 Supervision of other high risk pregnancies, third trimester: Secondary | ICD-10-CM

## 2023-12-25 NOTE — Patient Instructions (Signed)

## 2023-12-26 NOTE — Progress Notes (Signed)
   PRENATAL VISIT NOTE  Subjective:  Brandy Knight is a 33 y.o. G2P1001 at [redacted]w[redacted]d being seen today for ongoing prenatal care.  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  Patient reports no bleeding, no contractions, no cramping, and no leaking.  Contractions: Irritability. Vag. Bleeding: None.  Movement: Present. Denies leaking of fluid.   The following portions of the patient's history were reviewed and updated as appropriate: allergies, current medications, past family history, past medical history, past social history, past surgical history and problem list.   Objective:    Vitals:   12/25/23 1629  BP: (!) 86/54  Pulse: (!) 104  Weight: 179 lb 1.6 oz (81.2 kg)    Fetal Status:  Fetal Heart Rate (bpm): 135   Movement: Present    General: Alert, oriented and cooperative. Patient is in no acute distress.  Skin: Skin is warm and dry. No rash noted.   Cardiovascular: Normal heart rate noted  Respiratory: Normal respiratory effort, no problems with respiration noted  Abdomen: Soft, gravid, appropriate for gestational age.  Pain/Pressure: Absent     Pelvic: Cervical exam performed in the presence of a chaperone      closed/thick/-2  Extremities: Normal range of motion.  Edema: None  Mental Status: Normal mood and affect. Normal behavior. Normal judgment and thought content.   Assessment and Plan:  Pregnancy: G2P1001 at [redacted]w[redacted]d 1. Supervision of  high risk pregnancy, antepartum FHR BP appropriate today  2. Anemia, mild Most recent hemoglobin 10.3.  Anemia panel within normal limits.  3. 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 Right adnexal mass Seen on CT in July.  Repeat imaging was not apparent. Per chart review consider delivery at [facility] if C-section needed so specialist can evaluate during C-section.  5. Obesity affecting pregnancy, antepartum, unspecified obesity type  6. Malignant granulosa cell tumor of ovary, left (HCC) S/p resection and chemotherapy in 2022 and follows with gynecology oncology  7. History of maternal syphilis, currently pregnant, third trimester S/p Bicillin  x 3  8. Benign gestational thrombocytopenia in third trimester (HCC)  9. [redacted] weeks gestation of pregnancy (Primary) - Culture, beta strep (group b only) - Cervicovaginal ancillary only( [place])  Preterm labor symptoms and general obstetric precautions including but not limited to vaginal bleeding, contractions, leaking of fluid and fetal movement were reviewed in detail with the patient. Please refer to After Visit Summary for other counseling recommendations.   No follow-ups on file.  Future Appointments  Date Time Provider Department Center  12/30/2023  9:15 AM [facility] PROVIDER 1 [facility]  12/30/2023  9:30 AM [facility] US5 [facility]  01/02/2024 10:15 AM Eveline Lynwood MATSU, MD [facility]  02/14/2024  3:15 PM Viktoria Comer JONELLE, MD [facility] None  03/13/2024  1:15 PM [facility] LAB [facility] None    [name], MD

## 2023-12-27 ENCOUNTER — Ambulatory Visit: Payer: Self-pay | Admitting: Family Medicine

## 2023-12-27 LAB — CERVICOVAGINAL ANCILLARY ONLY
Chlamydia: NEGATIVE
Comment: NEGATIVE
Comment: NORMAL
Neisseria Gonorrhea: NEGATIVE

## 2023-12-29 LAB — CULTURE, BETA STREP (GROUP B ONLY): Strep Gp B Culture: NEGATIVE

## 2023-12-30 ENCOUNTER — Ambulatory Visit: Attending: Obstetrics and Gynecology | Admitting: Obstetrics and Gynecology

## 2023-12-30 ENCOUNTER — Ambulatory Visit (HOSPITAL_BASED_OUTPATIENT_CLINIC_OR_DEPARTMENT_OTHER)

## 2023-12-30 VITALS — BP 113/60 | HR 86

## 2023-12-30 DIAGNOSIS — Z362 Encounter for other antenatal screening follow-up: Secondary | ICD-10-CM | POA: Insufficient documentation

## 2023-12-30 DIAGNOSIS — E669 Obesity, unspecified: Secondary | ICD-10-CM

## 2023-12-30 DIAGNOSIS — O99213 Obesity complicating pregnancy, third trimester: Secondary | ICD-10-CM | POA: Insufficient documentation

## 2023-12-30 DIAGNOSIS — O0933 Supervision of pregnancy with insufficient antenatal care, third trimester: Secondary | ICD-10-CM | POA: Diagnosis not present

## 2023-12-30 DIAGNOSIS — Z3A37 37 weeks gestation of pregnancy: Secondary | ICD-10-CM | POA: Insufficient documentation

## 2023-12-30 DIAGNOSIS — Z3687 Encounter for antenatal screening for uncertain dates: Secondary | ICD-10-CM | POA: Diagnosis not present

## 2023-12-30 DIAGNOSIS — O99210 Obesity complicating pregnancy, unspecified trimester: Secondary | ICD-10-CM

## 2023-12-30 DIAGNOSIS — Z8543 Personal history of malignant neoplasm of ovary: Secondary | ICD-10-CM | POA: Diagnosis not present

## 2023-12-30 DIAGNOSIS — Z8279 Family history of other congenital malformations, deformations and chromosomal abnormalities: Secondary | ICD-10-CM

## 2023-12-30 DIAGNOSIS — Z9221 Personal history of antineoplastic chemotherapy: Secondary | ICD-10-CM | POA: Diagnosis not present

## 2023-12-30 DIAGNOSIS — O0990 Supervision of high risk pregnancy, unspecified, unspecified trimester: Secondary | ICD-10-CM

## 2023-12-30 NOTE — Progress Notes (Signed)
 After review, MFM consult with provider is not indicated for today  Arna Ranks, MD 12/30/2023 1:21 PM  [facility]

## 2024-01-01 ENCOUNTER — Ambulatory Visit

## 2024-01-02 ENCOUNTER — Ambulatory Visit: Admitting: Obstetrics & Gynecology

## 2024-01-02 ENCOUNTER — Ambulatory Visit

## 2024-01-02 ENCOUNTER — Other Ambulatory Visit: Payer: Self-pay

## 2024-01-02 VITALS — BP 102/67 | HR 83 | Wt 178.0 lb

## 2024-01-02 DIAGNOSIS — D563 Thalassemia minor: Secondary | ICD-10-CM

## 2024-01-02 DIAGNOSIS — O99113 Other diseases of the blood and blood-forming organs and certain disorders involving the immune mechanism complicating pregnancy, third trimester: Secondary | ICD-10-CM | POA: Diagnosis not present

## 2024-01-02 DIAGNOSIS — N9489 Other specified conditions associated with female genital organs and menstrual cycle: Secondary | ICD-10-CM

## 2024-01-02 DIAGNOSIS — O0933 Supervision of pregnancy with insufficient antenatal care, third trimester: Secondary | ICD-10-CM

## 2024-01-02 DIAGNOSIS — D696 Thrombocytopenia, unspecified: Secondary | ICD-10-CM

## 2024-01-02 DIAGNOSIS — Z3A37 37 weeks gestation of pregnancy: Secondary | ICD-10-CM

## 2024-01-02 DIAGNOSIS — O0993 Supervision of high risk pregnancy, unspecified, third trimester: Secondary | ICD-10-CM

## 2024-01-02 DIAGNOSIS — O0990 Supervision of high risk pregnancy, unspecified, unspecified trimester: Secondary | ICD-10-CM

## 2024-01-02 DIAGNOSIS — Z820 Family history of epilepsy and other diseases of the nervous system: Secondary | ICD-10-CM | POA: Diagnosis not present

## 2024-01-02 DIAGNOSIS — C562 Malignant neoplasm of left ovary: Secondary | ICD-10-CM | POA: Diagnosis not present

## 2024-01-02 NOTE — Progress Notes (Signed)
   PRENATAL VISIT NOTE  Subjective:  Brandy Knight is a 33 y.o. G2P1001 at [redacted]w[redacted]d being seen today for ongoing prenatal care.  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  Patient reports no complaints.  Contractions: Not present. Vag. Bleeding: None.  Movement: Present. Denies leaking of fluid.   The following portions of the patient's history were reviewed and updated as appropriate: allergies, current medications, past family history, past medical history, past social history, past surgical history and problem list.   Objective:    Vitals:   01/02/24 1024  BP: 102/67  Pulse: 83  Weight: 178 lb (80.7 kg)    Fetal Status:  Fetal Heart Rate (bpm): 131   Movement: Present    General: Alert, oriented and cooperative. Patient is in no acute distress.  Skin: Skin is warm and dry. No rash noted.   Cardiovascular: Normal heart rate noted  Respiratory: Normal respiratory effort, no problems with respiration noted  Abdomen: Soft, gravid, appropriate for gestational age.  Pain/Pressure: Absent     Pelvic: Cervical exam deferred        Extremities: Normal range of motion.  Edema: None  Mental Status: Normal mood and affect. Normal behavior. Normal judgment and thought content.   Assessment and Plan:  Pregnancy: G2P1001 at [redacted]w[redacted]d 1. Late prenatal care affecting pregnancy in third trimester (Primary)   2. Malignant granulosa cell tumor of ovary, left  (HCC)   3. Benign gestational thrombocytopenia in third trimester   4. Supervision of high risk pregnancy, antepartum   5. Right adnexal mass   Term labor symptoms and general obstetric precautions including but not limited to vaginal bleeding, contractions, leaking of fluid and fetal movement were reviewed in detail with the patient. Please refer to After Visit Summary for other counseling recommendations.   Return in about 1 week (around 01/09/2024).  Future Appointments  Date Time Provider Department Center  01/02/2024  2:30 PM Baldemar Lauraine DEL [facility]  01/09/2024  2:15 PM Izell Harari, MD [facility]  01/14/2024  3:15 PM Nicholaus Burnard HERO, MD [facility]  02/14/2024  3:15 PM Viktoria Comer JONELLE, MD [facility] None  03/13/2024  1:15 PM [facility] None    Lynwood Solomons, MD

## 2024-01-02 NOTE — Progress Notes (Signed)
 [facility] at [facility] [address], [address] Phone:  [phone]   Fax:  [phone]      Virtual Visit via Video Note  I connected with  Brandy Knight on 01/02/24 at  2:30 PM EDT by a video enabled telemedicine application and verified that I am speaking with the correct person using two identifiers.  Location: Patient: Home. Provider: Maternal Fetal Care.   I discussed the limitations of evaluation and management by telemedicine and the availability of in person appointments. The patient expressed understanding and agreed to proceed.  Genetic Counseling Clinic Note:   I spoke with 33 y.o. Brandy Knight today to discuss her carrier screening results. She was referred by Izell Harari, MD.    Pregnancy History:    [id number]. EGA: [redacted]w[redacted]d by US . EDD: 01/17/2024. She has a 70 year old son. Personal history of ovarian cancer in 2022 s/p surgery and chemotherapy. Denies other major personal health concerns. Denies bleeding, infections, and fevers in this pregnancy. Denies using tobacco, alcohol, or street drugs in this pregnancy.   Family History:    A three-generation pedigree was created and scanned into Epic under the Media tab.  Patient was diagnosed with ovarian cancer at 33 yo. Different types of cancer can have a hereditary component that increases an individual's susceptibility to develop cancer; however, most cancers occur by chance due to a combination of genetics and the environment. A referral to cancer genetics was offered, and the patient declined but may consider it in the future.  Brandy provided verbal consent to view her son's medical chart. Patient's 36 yo son has a duplication in chromosome 3p12.3 of 1.3 Mb that was discovered on chromosomal microarray as he was born with distinct facial features that were suspicious of a genetic condition. He has developed seizures at ~35 yo and is currently taking seizure  medication. He receives various therapies at school and has an IEP. She reports he has been progressing very well with his therapies. 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 No parental chromosome testing was performed to determine if this was inherited from either parent or if it occurred de novo. He was seen by [facility] pediatric genetics. We reviewed that recurrence risk is typically low if this occurred de novo; however, it may be up to 50% if either parent also had this duplication. Moreover, it is recommended he be reevaluated by genetics to determine other possible genetic causes to his symptoms that may not have been detected by previously performed testing. They can also reanalyze the VUS, as the classification may have changed since his last genetics visit. This will all inform recurrence risk to future pregnancies.  Patient reports a maternal half-sister with a history of seizures. No other information known. We reviewed that recurrence risk of seizures is difficult to estimate as no definitive cause to the seizures has been established; however, it is expected to be higher than that of the general population due to the positive family history. Melany is recommended to inform her newborn's pediatrician so they can monitor appropriately.  Patient ethnicity reported as Black and FOB ethnicity reported as Black. Denies Ashkenazi Jewish ancestry.  Family history otherwise not remarkable for consanguinity, individuals with birth defects, intellectual disability, autism spectrum disorder, multiple spontaneous abortions, still births, or unexplained neonatal death.   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 She screened negative for the other three conditions (CF, SMA, and beta-hemoglobinopathies) which significantly reduces  but does not eliminate the chance of being a carrier for those conditions. Please see report for details.  We reviewed the genetics of alpha thalassemia, autosomal recessive mode of inheritance, and clinical features of these conditions. We reviewed that Chaquita will either pass down two copies of the alpha globin gene (??) OR one copy of the alpha globin gene (-?) in each pregnancy. Therefore, this pregnancy is not at increased risk for hemoglobin Bart's due to four deletions of the alpha globin genes (--/--) regardless of her reproductive partner's carrier status. The pregnancy will be at increased risk (25%) for hemoglobin H disease if her partner is an alpha thalassemia carrier in the cis configuration (--/??). We reviewed that this is more common in [place] Asian populations and less common in those with Black ancestry.  Given these results, we discussed and offered carrier screening for Nimisha's reproductive partner. We reviewed the benefits and limitations of carrier screening and that it can detect most but not all carriers. She declined at this time due to her late gestational age. She also stated she does not plan on having more children. We reviewed that the [facility]  Newborn Screening (NBS) program will screen all newborn babies for cystic fibrosis, spinal muscular atrophy, hemoglobinopathies, and numerous other conditions but will not screen for alpha thalassemia. She elected to wait for the NBS results and for a referral to pediatric genetics if needed.   Newborn Screening. The   Newborn Screening (NBS) program will screen all newborn babies for cystic fibrosis, spinal muscular atrophy, hemoglobinopathies, and numerous other conditions. NBS will not screen for alpha thalassemia.  Previous Testing Completed:  Low risk NIPS: Deborra previously completed [facility] noninvasive prenatal screening (NIPS) in this pregnancy. The result is low risk, consistent with a female fetus.  This screening significantly reduces but does not eliminate the chance that the current pregnancy has Down syndrome (trisomy 40), trisomy 63, trisomy 33, and common sex chromosome conditions. Please see report for details. There are many genetic conditions that cannot be detected by NIPS.    Plan of Care:   Declined FOB carrier screening for alpha thalassemia. Newborn screening and postnatal referral of newborn to hematology as clinically indicated for alpha thalassemia. Routine prenatal care.   Informed consent was obtained. All questions were answered.   65 minutes were spent on the date of the encounter in service to the patient including preparation, consultation through video chat, discussion of test reports and available next steps, pedigree construction, genetic risk assessment, documentation, and care coordination.    Thank you for sharing in the care of [facility] with us .  Please do not hesitate to contact us  at [phone] if you have any questions.   Lauraine Bodily, MS, [facility] Certified Genetic Counselor   Genetic counseling student involved in appointment: No.

## 2024-01-09 ENCOUNTER — Other Ambulatory Visit: Payer: Self-pay

## 2024-01-09 ENCOUNTER — Ambulatory Visit (INDEPENDENT_AMBULATORY_CARE_PROVIDER_SITE_OTHER): Admitting: Obstetrics and Gynecology

## 2024-01-09 VITALS — BP 102/67 | HR 97 | Wt 182.2 lb

## 2024-01-09 DIAGNOSIS — C562 Malignant neoplasm of left ovary: Secondary | ICD-10-CM

## 2024-01-09 DIAGNOSIS — O0933 Supervision of pregnancy with insufficient antenatal care, third trimester: Secondary | ICD-10-CM | POA: Diagnosis not present

## 2024-01-09 DIAGNOSIS — N9489 Other specified conditions associated with female genital organs and menstrual cycle: Secondary | ICD-10-CM

## 2024-01-09 DIAGNOSIS — Z3A38 38 weeks gestation of pregnancy: Secondary | ICD-10-CM

## 2024-01-09 DIAGNOSIS — O0990 Supervision of high risk pregnancy, unspecified, unspecified trimester: Secondary | ICD-10-CM

## 2024-01-09 DIAGNOSIS — Z820 Family history of epilepsy and other diseases of the nervous system: Secondary | ICD-10-CM

## 2024-01-09 DIAGNOSIS — D563 Thalassemia minor: Secondary | ICD-10-CM

## 2024-01-09 DIAGNOSIS — D649 Anemia, unspecified: Secondary | ICD-10-CM | POA: Diagnosis not present

## 2024-01-09 DIAGNOSIS — O09893 Supervision of other high risk pregnancies, third trimester: Secondary | ICD-10-CM

## 2024-01-09 DIAGNOSIS — O99113 Other diseases of the blood and blood-forming organs and certain disorders involving the immune mechanism complicating pregnancy, third trimester: Secondary | ICD-10-CM

## 2024-01-09 DIAGNOSIS — D696 Thrombocytopenia, unspecified: Secondary | ICD-10-CM

## 2024-01-09 DIAGNOSIS — Z9221 Personal history of antineoplastic chemotherapy: Secondary | ICD-10-CM

## 2024-01-09 DIAGNOSIS — O99210 Obesity complicating pregnancy, unspecified trimester: Secondary | ICD-10-CM

## 2024-01-09 DIAGNOSIS — O99213 Obesity complicating pregnancy, third trimester: Secondary | ICD-10-CM

## 2024-01-09 NOTE — Progress Notes (Signed)
 PRENATAL VISIT NOTE  Subjective:  Brandy Knight is a 33 y.o. G2P1001 at [redacted]w[redacted]d being seen today for ongoing prenatal care.  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  Patient reports no complaints.  Contractions: Irritability. Vag. Bleeding: None.  Movement: Present. Denies leaking of fluid.   The following portions of the patient's history were reviewed and updated as appropriate: allergies, current medications, past family history, past medical history, past social history, past surgical history and problem list.   Objective:    Vitals:   01/09/24 1428  BP: 102/67  Pulse: 97  Weight: 182 lb 3.2 oz (82.6 kg)    Fetal Status:  Fetal Heart Rate (bpm): 135 Fundal Height: 38 cm Movement: Present Presentation: Vertex  General: Alert, oriented and cooperative. Patient is in no acute distress.  Skin: Skin is warm and dry. No rash noted.   Cardiovascular: Normal heart rate noted  Respiratory: Normal respiratory effort, no problems with respiration noted  Abdomen: Soft, gravid, appropriate for gestational age.  Pain/Pressure: Absent     Pelvic: Cervical exam deferred        Extremities: Normal range of motion.  Edema: None  Mental Status: Normal mood and affect. Normal behavior. Normal judgment and thought content.   Assessment and Plan:  Pregnancy: G2P1001 at [redacted]w[redacted]d 1. Supervision of high risk pregnancy, antepartum (Primary) Inpatient nexplanon D/w  her that can set up post dates IOL next visit GBS neg  2. [redacted] weeks gestation of pregnancy  3. Alpha thalassemia silent carrier S/p genetic counseling  4. Anemia, mild H/H and ferritin stable on 9/10    Latest Ref Rng & Units 12/18/2023    4:39 PM 11/21/2023    8:41 AM 11/06/2023    4:19 PM  CBC  WBC 3.4 - 10.8 x10E3/uL 11.0  11.3  11.2   Hemoglobin 11.1 - 15.9 g/dL 89.6  89.6  88.3   Hematocrit 34.0 - 46.6 % 31.9  32.9  36.9   Platelets 150 - 450 x10E3/uL 165  144  173    5. Benign gestational thrombocytopenia in third trimester See above  6. History of chemotherapy Followed regularly by Gyn Onc (Dr. Viktoria) and Heme. Plan to follow up later this year with her. 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   7. Malignant granulosa cell tumor of ovary, left (HCC) See above  8. Right adnexal mass Not seen on most recent u/s  9. History of maternal syphilis, currently pregnant, third trimester S/p pcn on 8/11, 8/18, 8/26 Placenta to pathology  10. Obesity affecting pregnancy, antepartum, unspecified obesity type  11. 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 Late prenatal care affecting pregnancy in third trimester  Term labor symptoms and general obstetric precautions including but not limited to vaginal bleeding, contractions, leaking of fluid and fetal movement were reviewed in detail with the patient. Please refer to After Visit Summary for other counseling recommendations.   No follow-ups on file.  Future Appointments  Date Time  Provider Department Center  01/14/2024  3:15 PM Nicholaus Burnard HERO, MD [facility]  02/14/2024  3:15 PM Viktoria Comer SAUNDERS, MD [facility] None  03/13/2024  1:15 PM [facility] None    [name], MD

## 2024-01-14 ENCOUNTER — Ambulatory Visit (INDEPENDENT_AMBULATORY_CARE_PROVIDER_SITE_OTHER)

## 2024-01-14 ENCOUNTER — Other Ambulatory Visit: Payer: Self-pay

## 2024-01-14 ENCOUNTER — Other Ambulatory Visit

## 2024-01-14 VITALS — BP 107/60 | HR 79 | Wt 184.9 lb

## 2024-01-14 DIAGNOSIS — Z820 Family history of epilepsy and other diseases of the nervous system: Secondary | ICD-10-CM

## 2024-01-14 DIAGNOSIS — O0990 Supervision of high risk pregnancy, unspecified, unspecified trimester: Secondary | ICD-10-CM

## 2024-01-14 DIAGNOSIS — O99113 Other diseases of the blood and blood-forming organs and certain disorders involving the immune mechanism complicating pregnancy, third trimester: Secondary | ICD-10-CM | POA: Diagnosis not present

## 2024-01-14 DIAGNOSIS — Z9221 Personal history of antineoplastic chemotherapy: Secondary | ICD-10-CM

## 2024-01-14 DIAGNOSIS — O0933 Supervision of pregnancy with insufficient antenatal care, third trimester: Secondary | ICD-10-CM | POA: Diagnosis not present

## 2024-01-14 DIAGNOSIS — N9489 Other specified conditions associated with female genital organs and menstrual cycle: Secondary | ICD-10-CM

## 2024-01-14 DIAGNOSIS — D696 Thrombocytopenia, unspecified: Secondary | ICD-10-CM

## 2024-01-14 DIAGNOSIS — C562 Malignant neoplasm of left ovary: Secondary | ICD-10-CM

## 2024-01-14 DIAGNOSIS — E66812 Obesity, class 2: Secondary | ICD-10-CM | POA: Diagnosis not present

## 2024-01-14 DIAGNOSIS — Z3A39 39 weeks gestation of pregnancy: Secondary | ICD-10-CM

## 2024-01-14 DIAGNOSIS — D563 Thalassemia minor: Secondary | ICD-10-CM

## 2024-01-14 DIAGNOSIS — D649 Anemia, unspecified: Secondary | ICD-10-CM

## 2024-01-14 DIAGNOSIS — O09893 Supervision of other high risk pregnancies, third trimester: Secondary | ICD-10-CM

## 2024-01-14 NOTE — Progress Notes (Signed)
 Subjective:  ARELYS GLASSCO is a 33 y.o. G2P1001 at [redacted]w[redacted]d being seen today for ongoing prenatal care.  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  Patient reports no complaints.  Contractions: Irritability. Vag. Bleeding: None.  Movement: Present. Denies leaking of fluid.   Wants to discuss post-dates IOL  The following portions of the patient's history were reviewed and updated as appropriate: allergies, current medications, past family history, past medical history, past social history, past surgical history and problem list. Problem list updated.  Objective:   Vitals:   01/14/24 1520  BP: 107/60  Pulse: 79  Weight: 83.9 kg    Fetal Status: Fetal Heart Rate (bpm): 151 Fundal Height: 39 cm Movement: Present   Vertex by leopolds  General:  Alert, oriented and cooperative. Patient is in no acute distress.  Skin: Skin is warm and dry. No rash noted.   Respiratory: Normal respiratory effort, no problems with respiration noted  Abdomen: Soft, gravid, appropriate for gestational age. Pain/Pressure: Absent     Pelvic: Vag. Bleeding: None     Cervical exam performed Dilation: 1.5 Effacement (%): 30 Station: Ballotable  Extremities: Normal range of motion.  Edema: None  Mental Status: Normal mood and affect. Normal behavior. Normal judgment and thought content.    Assessment and Plan:  Pregnancy: G2P1001 at [redacted]w[redacted]d  1. [redacted] weeks gestation of pregnancy  (Primary) - post-dates IOL scheduled today - US  FETAL BPP WO NON STRESS; Future -GBS neg -Inpt Nexplanon for birth control -desires circumcision  2. Supervision of high risk pregnancy, antepartum See below  3. Benign gestational thrombocytopenia in third trimester Stable on 9/10 labs  4. Obesity, Class II, BMI 35-39.9 9/22 US @37 .3wga: EFW 36%tile, 2974g (6lb9oz)  5. Anemia, mild Stable on 9/10 labs  6. Late prenatal care affecting pregnancy in third trimester  7. History of maternal syphilis, currently pregnant, third trimester S/p pcn 8/11, 8/18, 8/26. Placenta to pahtology  8. 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 Alpha thalassemia silent carrier S/p genetic counseling  10. Malignant granulosa cell tumor of ovary, left (HCC) 11. History of chemotherapy 12. Right adnexal mass Followed regularly by Gyn Onc (Dr. Viktoria) and Heme. Plan to follow up later this year with her. 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   Term labor symptoms and general obstetric precautions including but not limited to vaginal bleeding, contractions, leaking of fluid and fetal movement were reviewed in detail with the patient. Please refer to After Visit Summary for other counseling recommendations.   Follow up in 1 week for OB appt and BPP   Trudy Leeroy NOVAK, MD

## 2024-01-17 ENCOUNTER — Inpatient Hospital Stay (HOSPITAL_COMMUNITY): Admit: 2024-01-17

## 2024-01-20 ENCOUNTER — Encounter: Payer: Self-pay | Admitting: Obstetrics and Gynecology

## 2024-01-20 ENCOUNTER — Ambulatory Visit (INDEPENDENT_AMBULATORY_CARE_PROVIDER_SITE_OTHER): Admitting: Obstetrics and Gynecology

## 2024-01-20 ENCOUNTER — Other Ambulatory Visit: Payer: Self-pay

## 2024-01-20 ENCOUNTER — Encounter (HOSPITAL_COMMUNITY): Payer: Self-pay | Admitting: *Deleted

## 2024-01-20 ENCOUNTER — Other Ambulatory Visit (INDEPENDENT_AMBULATORY_CARE_PROVIDER_SITE_OTHER)

## 2024-01-20 ENCOUNTER — Telehealth (HOSPITAL_COMMUNITY): Payer: Self-pay | Admitting: *Deleted

## 2024-01-20 VITALS — BP 116/73 | HR 94 | Wt 186.0 lb

## 2024-01-20 DIAGNOSIS — O0933 Supervision of pregnancy with insufficient antenatal care, third trimester: Secondary | ICD-10-CM

## 2024-01-20 DIAGNOSIS — O0990 Supervision of high risk pregnancy, unspecified, unspecified trimester: Secondary | ICD-10-CM

## 2024-01-20 DIAGNOSIS — O09893 Supervision of other high risk pregnancies, third trimester: Secondary | ICD-10-CM

## 2024-01-20 DIAGNOSIS — Z820 Family history of epilepsy and other diseases of the nervous system: Secondary | ICD-10-CM | POA: Diagnosis not present

## 2024-01-20 DIAGNOSIS — O99210 Obesity complicating pregnancy, unspecified trimester: Secondary | ICD-10-CM

## 2024-01-20 DIAGNOSIS — N9489 Other specified conditions associated with female genital organs and menstrual cycle: Secondary | ICD-10-CM

## 2024-01-20 DIAGNOSIS — O480 Post-term pregnancy: Secondary | ICD-10-CM | POA: Diagnosis not present

## 2024-01-20 DIAGNOSIS — O0993 Supervision of high risk pregnancy, unspecified, third trimester: Secondary | ICD-10-CM

## 2024-01-20 DIAGNOSIS — O99113 Other diseases of the blood and blood-forming organs and certain disorders involving the immune mechanism complicating pregnancy, third trimester: Secondary | ICD-10-CM

## 2024-01-20 DIAGNOSIS — O99213 Obesity complicating pregnancy, third trimester: Secondary | ICD-10-CM

## 2024-01-20 DIAGNOSIS — Z3A40 40 weeks gestation of pregnancy: Secondary | ICD-10-CM | POA: Diagnosis not present

## 2024-01-20 DIAGNOSIS — D696 Thrombocytopenia, unspecified: Secondary | ICD-10-CM

## 2024-01-20 DIAGNOSIS — Z3A39 39 weeks gestation of pregnancy: Secondary | ICD-10-CM | POA: Diagnosis not present

## 2024-01-20 NOTE — Telephone Encounter (Signed)
 Preadmission screen

## 2024-01-20 NOTE — Progress Notes (Signed)
   PRENATAL VISIT NOTE  Subjective:  Brandy Knight is a 33 y.o. G2P1001 at 108w3d being seen today for ongoing prenatal care.  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  Patient reports no complaints.  Contractions: Not present. Vag. Bleeding: None.  Movement: Present. Denies leaking of fluid.   The following portions of the patient's history were reviewed and updated as appropriate: allergies, current medications, past family history, past medical history, past social history, past surgical history and problem list.   Objective:    Vitals:   01/20/24 1101  BP: 116/73  Pulse: 94  Weight: 186 lb (84.4 kg)    Fetal Status:      Movement: Present    General: Alert, oriented and cooperative. Patient is in no acute distress.  Skin: Skin is warm and dry. No rash noted.   Cardiovascular: Normal heart rate noted  Respiratory: Normal respiratory effort, no problems with respiration noted  Abdomen: Soft, gravid, appropriate for gestational age.  Pain/Pressure: Absent     Pelvic: Cervical exam deferred        Extremities: Normal range of motion.  Edema: None  Mental Status: Normal mood and affect. Normal behavior. Normal judgment and thought content.   Assessment and Plan:  Pregnancy: G2P1001 at [redacted]w[redacted]d 1. Supervision of high risk pregnancy, antepartum IOL scheduled for 10/17.   2. [redacted] weeks gestation of pregnancy (Primary) BPP today - 8/8.   3. Seizure  disorder in son (chromosome 3 duplication)  4. Right adnexal mass Has f/u with Dr. Viktoria after delivery  5. History of maternal syphilis, currently pregnant, third trimester Send placenta after delivery  6. Late prenatal care affecting pregnancy in third trimester  7. Obesity affecting pregnancy, antepartum, unspecified obesity type Serial growth wnl. 9/22 was 36%ile, normal afi.   8. Benign gestational thrombocytopenia in third trimester Last platelets normal  Term labor symptoms and general obstetric precautions including but not limited to vaginal bleeding, contractions, leaking of fluid and fetal movement were reviewed in detail with the patient. Please refer to After Visit Summary for other counseling recommendations.   No follow-ups on file.  Future Appointments  Date Time Provider Department Center  01/24/2024  6:30 AM [facility] SCHED ROOM [facility] None  02/14/2024  3:15 PM Viktoria Comer JONELLE, MD [facility] None  03/13/2024  1:15 PM [facility] None    Vina Solian, MD

## 2024-01-21 ENCOUNTER — Ambulatory Visit: Payer: Self-pay

## 2024-01-23 ENCOUNTER — Other Ambulatory Visit: Payer: Self-pay | Admitting: Certified Nurse Midwife

## 2024-01-24 ENCOUNTER — Other Ambulatory Visit: Payer: Self-pay

## 2024-01-24 ENCOUNTER — Inpatient Hospital Stay (HOSPITAL_COMMUNITY)
Admission: RE | Admit: 2024-01-24 | Discharge: 2024-01-27 | DRG: 806 | Disposition: A | Discharge status: Home / Self Care | Attending: Obstetrics & Gynecology | Admitting: Obstetrics & Gynecology

## 2024-01-24 ENCOUNTER — Encounter (HOSPITAL_COMMUNITY): Payer: Self-pay | Admitting: Obstetrics and Gynecology

## 2024-01-24 ENCOUNTER — Inpatient Hospital Stay (HOSPITAL_COMMUNITY)

## 2024-01-24 DIAGNOSIS — Z833 Family history of diabetes mellitus: Secondary | ICD-10-CM | POA: Diagnosis not present

## 2024-01-24 DIAGNOSIS — O9902 Anemia complicating childbirth: Secondary | ICD-10-CM | POA: Diagnosis present

## 2024-01-24 DIAGNOSIS — Z3A41 41 weeks gestation of pregnancy: Secondary | ICD-10-CM

## 2024-01-24 DIAGNOSIS — D696 Thrombocytopenia, unspecified: Secondary | ICD-10-CM | POA: Diagnosis present

## 2024-01-24 DIAGNOSIS — Z8543 Personal history of malignant neoplasm of ovary: Secondary | ICD-10-CM | POA: Diagnosis not present

## 2024-01-24 DIAGNOSIS — Z3490 Encounter for supervision of normal pregnancy, unspecified, unspecified trimester: Principal | ICD-10-CM

## 2024-01-24 DIAGNOSIS — Z9221 Personal history of antineoplastic chemotherapy: Secondary | ICD-10-CM

## 2024-01-24 DIAGNOSIS — E66812 Obesity, class 2: Secondary | ICD-10-CM | POA: Diagnosis present

## 2024-01-24 DIAGNOSIS — Z148 Genetic carrier of other disease: Secondary | ICD-10-CM

## 2024-01-24 DIAGNOSIS — O99214 Obesity complicating childbirth: Secondary | ICD-10-CM | POA: Diagnosis present

## 2024-01-24 DIAGNOSIS — O480 Post-term pregnancy: Secondary | ICD-10-CM | POA: Diagnosis present

## 2024-01-24 DIAGNOSIS — Z8619 Personal history of other infectious and parasitic diseases: Secondary | ICD-10-CM

## 2024-01-24 DIAGNOSIS — C562 Malignant neoplasm of left ovary: Secondary | ICD-10-CM | POA: Diagnosis present

## 2024-01-24 DIAGNOSIS — O9912 Other diseases of the blood and blood-forming organs and certain disorders involving the immune mechanism complicating childbirth: Secondary | ICD-10-CM | POA: Diagnosis present

## 2024-01-24 DIAGNOSIS — O09893 Supervision of other high risk pregnancies, third trimester: Secondary | ICD-10-CM

## 2024-01-24 LAB — CBC
HCT: 33.6 % — ABNORMAL LOW (ref 36.0–46.0)
Hemoglobin: 11.1 g/dL — ABNORMAL LOW (ref 12.0–15.0)
MCH: 27.1 pg (ref 26.0–34.0)
MCHC: 33 g/dL (ref 30.0–36.0)
MCV: 82.2 fL (ref 80.0–100.0)
Platelets: 152 K/uL (ref 150–400)
RBC: 4.09 MIL/uL (ref 3.87–5.11)
RDW: 14.6 % (ref 11.5–15.5)
WBC: 11.5 K/uL — ABNORMAL HIGH (ref 4.0–10.5)
nRBC: 0 % (ref 0.0–0.2)

## 2024-01-24 LAB — TYPE AND SCREEN
ABO/RH(D): AB POS
Antibody Screen: NEGATIVE

## 2024-01-24 LAB — RPR
RPR Ser Ql: REACTIVE — AB
RPR Titer: 1:4 {titer}

## 2024-01-24 MED ORDER — MISOPROSTOL 50MCG HALF TABLET
50.0000 ug | ORAL_TABLET | Freq: Once | ORAL | Status: DC
  Filled 2024-01-24: qty 1

## 2024-01-24 MED ORDER — LIDOCAINE HCL (PF) 1 % IJ SOLN: 30.0000 mL | INTRAMUSCULAR | Status: DC | PRN

## 2024-01-24 MED ORDER — TERBUTALINE SULFATE 1 MG/ML IJ SOLN: 0.2500 mg | Freq: Once | INTRAMUSCULAR | Status: DC | PRN

## 2024-01-24 MED ORDER — LACTATED RINGERS IV SOLN: 500.0000 mL | INTRAVENOUS | Status: DC | PRN

## 2024-01-24 MED ORDER — LACTATED RINGERS IV SOLN: INTRAVENOUS | Status: DC

## 2024-01-24 MED ORDER — OXYTOCIN BOLUS FROM INFUSION
333.0000 mL | Freq: Once | INTRAVENOUS | Status: AC
  Administered 2024-01-25: 333 mL via INTRAVENOUS

## 2024-01-24 MED ORDER — MISOPROSTOL 25 MCG QUARTER TABLET
25.0000 ug | ORAL_TABLET | Freq: Once | ORAL | Status: DC
  Filled 2024-01-24: qty 1

## 2024-01-24 MED ORDER — FENTANYL CITRATE (PF) 100 MCG/2ML IJ SOLN
50.0000 ug | INTRAMUSCULAR | Status: DC | PRN
  Administered 2024-01-24 – 2024-01-25 (×3): 100 ug via INTRAVENOUS
  Filled 2024-01-24 (×3): qty 2

## 2024-01-24 MED ORDER — OXYTOCIN-SODIUM CHLORIDE 30-0.9 UT/500ML-% IV SOLN: 2.5000 [IU]/h | INTRAVENOUS | Status: DC

## 2024-01-24 MED ORDER — MISOPROSTOL 25 MCG QUARTER TABLET
25.0000 ug | ORAL_TABLET | Freq: Once | ORAL | Status: AC
  Administered 2024-01-24: 25 ug via ORAL
  Filled 2024-01-24: qty 1

## 2024-01-24 MED ORDER — ACETAMINOPHEN 325 MG PO TABS: 650.0000 mg | ORAL_TABLET | ORAL | Status: DC | PRN

## 2024-01-24 MED ORDER — MISOPROSTOL 25 MCG QUARTER TABLET
25.0000 ug | ORAL_TABLET | Freq: Once | ORAL | Status: AC
  Administered 2024-01-24: 25 ug via VAGINAL
  Filled 2024-01-24: qty 1

## 2024-01-24 MED ORDER — ZOLPIDEM TARTRATE 5 MG PO TABS
5.0000 mg | ORAL_TABLET | Freq: Every evening | ORAL | Status: DC | PRN
  Administered 2024-01-24: 5 mg via ORAL
  Filled 2024-01-24: qty 1

## 2024-01-24 MED ORDER — OXYCODONE-ACETAMINOPHEN 5-325 MG PO TABS: 2.0000 | ORAL_TABLET | ORAL | Status: DC | PRN

## 2024-01-24 MED ORDER — OXYCODONE-ACETAMINOPHEN 5-325 MG PO TABS: 1.0000 | ORAL_TABLET | ORAL | Status: DC | PRN

## 2024-01-24 MED ADMIN — Ondansetron HCl Inj 4 MG/2ML (2 MG/ML): 4 mg | INTRAVENOUS | NDC 60505613005

## 2024-01-24 MED FILL — Ondansetron HCl Inj 4 MG/2ML (2 MG/ML): 4.0000 mg | INTRAMUSCULAR | Qty: 2 | Status: AC

## 2024-01-24 NOTE — Plan of Care (Signed)

## 2024-01-24 NOTE — Progress Notes (Signed)
 Labor Progress Note ALEXAH KIVETT is a 33 y.o. G2P1001 at [redacted]w[redacted]d here for IOL secondary to post dates, high risk pregnancy.   S:  Terilyn reports no concerns and so far doing well, does not feel the need to have an epidural at this time. Was amenable to starting her induction with pitocin .   O:  BP 104/60 (BP Location: Left Arm)   Pulse 81   Temp 98.3 F (36.8 C) (Oral)   Resp 16   Ht 5' 2 (1.575 m)   Wt 83.1 kg   LMP 05/11/2023 (Approximate)   SpO2 98%   BMI 33.53 kg/m   EFM: baseline 155 bpm/ good variability/ + accels/ -- decels  Toco/IUPC: 2 SVE: Dilation: Closed Effacement (%): Thick Cervical Position: Posterior Station: -3 Presentation: Vertex Exam by:: Dr. Carilyn Pitocin : None  A/P: JERLYN PAIN is a 33 y.o. G2P1001 at [redacted]w[redacted]d here for IOL secondary to post dates, high risk pregnancy.  1. Labor: IOL, s/p Dcyto at 1210 2. FWB: Cat 1 3. Pain: Attempting to labor naturally 4. GBS -   Anticipate SVD.  Terrace Carilyn, MD 3:27 PM

## 2024-01-24 NOTE — Progress Notes (Signed)
 Labor Progress Note LAMESHIA HYPOLITE is a 33 y.o. G2P1001 at [redacted]w[redacted]d presenting for post dates IOL  S: Introduced myself. Feeling uncomfortable with ctx. Discussed AROM after initial SVE, which she desires and verbally consented to.  O:  BP 104/60 (BP Location: Left Arm)   Pulse 81   Temp 98.3 F (36.8 C) (Oral)   Resp 16   Ht 5' 2 (1.575 m)   Wt 83.1 kg   LMP 05/11/2023 (Approximate)   SpO2 98%   BMI 33.53 kg/m  Lab Results  Component Value Date   HGB 11.1 (L) 01/24/2024    Time: 8:52 PM  FHT: baseline bpm 130, moderate variability, accelerations present, decelerations none (lots of artifact due to pt on the ball),   Contractions: q 1-2 mins,    CVE: Dilation: 3 Effacement (%): 50 Cervical Position: Posterior Station: -3 Presentation: Vertex Exam by:: Dr Trudy AROM at 2050 with clear fluid   A&P: 33 y.o. G2P1001 [redacted]w[redacted]d IOL for post-dates #Labor: Latent Labor dCyto &AROM  #Pain: Labor support without medications #FWB: Category I #GBS negative   Leeroy KATHEE Trudy, MD 8:52 PM

## 2024-01-24 NOTE — Progress Notes (Addendum)
 Labor Progress Note KENZA MUNAR is a 33 y.o. G2P1001 at [redacted]w[redacted]d here for IOL secondary to post dates, high risk pregnancy.   S:  Ellason was noted to be in greater discomfort at this time, reporting painful contractions at her sides and back. Still wants to try to labor without an epidural or pain medications. Agrees that she does not need a FB at this time. Will continue walking and using labor balls, Princess RN to keep an eye on variable decels that were occurring while she was walking and using the labor ball 30 minutes prior.   O:  BP 104/60 (BP Location: Left Arm)   Pulse 81   Temp 98.3 F (36.8 C) (Oral)   Resp 16   Ht 5' 2 (1.575 m)   Wt 83.1 kg   LMP 05/11/2023 (Approximate)   SpO2 98%   BMI 33.53 kg/m   EFM: baseline 150 bpm/ moderate variability/ + accels/ -- decels  Toco/IUPC: 1-5 minutes SVE: Dilation: 1 Effacement (%): 50 Cervical Position: Posterior Station: -3 Presentation: Vertex Exam by:: Bari Eagles RN Pitocin : None  A/P: JULIUS BONIFACE is a 33 y.o. G2P1001 at [redacted]w[redacted]d here for IOL secondary to post dates, high risk pregnancy.  1. Labor: IOL - continue management; s/p Dcyto and using labor ball + walking 2. FWB: Cat 1 3. Pain: Natural management 4. GBS(-)  Anticipate NSVD.  Terrace Compton, MD 6:06 PM

## 2024-01-24 NOTE — H&P (Addendum)
 OBSTETRIC ADMISSION HISTORY AND PHYSICAL  Brandy Knight is a 33 y.o. female G2P1001 with IUP at [redacted]w[redacted]d by 29 week US  presenting for IOL secondary to . She reports +FMs, No LOF, no VB, no blurry vision, headaches or peripheral edema, and RUQ pain.  She plans on both bottle and breast feeding. She request Nexplanon for birth control. 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 Dating by 29wk u/s  [facility] Model Traditional Anatomy U/S Wnl, f/w MFM  Initiated care at  [employer]  English               LAB RESULTS   Support Person Grandma and FOB Genetics NIPS: low risk panorama AFP: n/a      NT/IT (FT only)        Carrier Screen Horizon: [place]iss alpha thal  Rhogam  AB/Positive/-- (07/30 1619) A1C/GTT Early HgbA1C: n/a Third trimester 2 hr GTT: neg  Flu Vaccine Declined       TDaP Vaccine   Blood Type AB/Positive/-- (07/30 1619)  RSV Vaccine   Antibody Negative (07/30 1619)  COVID Vaccine   Rubella 0.98 (07/30 1619)  Feeding Plan Undecided  RPR Reactive (08/14 0841)  Contraception  Nexplanon  HBsAg Negative (07/30 1619)  Circumcision If boy Yes  HIV Non Reactive (08/14 0841)  Pediatrician    HCVAb Non Reactive (07/30 1619)  Prenatal Classes        BTL Consent   Pap       Diagnosis  Date Value Ref Range Status  04/26/2020     Final    - Negative for Intraepithelial Lesions or Malignancy (NILM)  04/26/2020 - Benign reactive/reparative changes   Final    BTL Pre-payment   GC/CT Initial: neg   36wks:    VBAC  Consent   GBS   For PCN allergy, check sensitivities   BRx Optimized? [ ]  yes   [ ]  no      DME Rx [ ]  BP cuff [ ]  Weight Scale Waterbirth  [ ]  Class [ ]  Consent [ ]  CNM visit  PHQ9 & GAD7 [  ] new OB [  ] 28 weeks  [  ] 36 weeks Induction  [ ]  Orders Entered [ ] Foley Y/N    Past Medical History: Past Medical History:  Diagnosis Date   Anogenital (venereal) warts 05/18/2013   Ascites    Medical history non-contributory    Pelvic mass    Weight gain 04/14/2021    Past Surgical History: Past Surgical History:  Procedure Laterality Date   IR PARACENTESIS  04/22/2020   LAPAROTOMY N/A 04/27/2020   Procedure: MINI LAPAROTOMY, ILEOCECAL RESECTION, APPENDECTOMY;  Surgeon: Viktoria Comer JONELLE, MD;  Location: [facility];  Service: Gynecology;  Laterality: N/A;   ROBOTIC ASSISTED SALPINGO OOPHERECTOMY Right 04/27/2020   Procedure: XI ROBOTIC ASSISTED UNITLATERAL SALPINGO OOPHORECTOMY;  Surgeon: Viktoria Comer JONELLE, MD;  Location: [facility];  Service: Gynecology;  Laterality: Right;   THORACENTESIS  WISDOM TOOTH EXTRACTION      Obstetrical History: OB History     Gravida  2   Para  1   Term  1   Preterm      AB      Living  1      SAB      IAB      Ectopic      Multiple      Live Births  1           Social History Social History   Socioeconomic History   Marital status: Single    Spouse name: Not on file   Number of children: 1   Years of education: Not on file   Highest education level: GED or equivalent  Occupational History    Employer: [employer]  Tobacco Use   Smoking status: Never   Smokeless tobacco: Never  Vaping Use   Vaping status: Never Used  Substance and Sexual Activity   Alcohol use: No   Drug use: No   Sexual activity: Not Currently    Birth control/protection: None  Other Topics Concern   Not on file  Social History Narrative   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)   Social Drivers of Health   Financial Resource Strain: Low Risk   (11/18/2023)   Overall Financial Resource Strain (CARDIA)    Difficulty of Paying Living Expenses: Not very hard  Food Insecurity: No Food Insecurity (11/18/2023)   Hunger Vital Sign    Worried About Running Out of Food in the Last Year: Never true    Ran Out of Food in the Last Year: Never true  Transportation Needs: No Transportation Needs (11/18/2023)   PRAPARE - Administrator, Civil Service (Medical): No    Lack of Transportation (Non-Medical): No  Physical Activity: Insufficiently Active (11/18/2023)   Exercise Vital Sign    Days of Exercise per Week: 2 days    Minutes of Exercise per Session: 20 min  Stress: No Stress Concern Present (11/18/2023)   [employer] of Occupational Health - Occupational Stress Questionnaire    Feeling of Stress: Not at all  Social Connections: Moderately Isolated (11/18/2023)   Social Connection and Isolation Panel    Frequency of Communication with Friends and Family: More than three times a week    Frequency of Social Gatherings with Friends and Family: Twice a week    Attends Religious Services: 1 to 4 times per year    Active Member of [employer] or Organizations: No    Attends Engineer, structural: Not on file    Marital Status: Never married    Family History: Family History  Problem Relation Age of Onset   Diabetes Mother    Diabetes Father    Seizures Sister    Myasthenia gravis Sister    ADD / ADHD Sister    Ovarian cancer Neg Hx    Uterine cancer Neg Hx    Breast cancer Neg Hx    Colon cancer Neg Hx     Allergies: No Known Allergies  Medications Prior to Admission  Medication Sig Dispense Refill Last Dose/Taking   Prenatal Vit-Fe Fumarate-FA (PRENATAL MULTIVITAMIN) TABS tablet Take 1 tablet by mouth daily at 12 noon.       Review of Systems   All systems reviewed and negative except as stated in HPI  Weight 83.1 kg, last menstrual period 05/11/2023. General appearance: alert, cooperative, and no  distress Lungs: clear to auscultation bilaterally  Heart: regular rate and rhythm Abdomen: soft, non-tender; bowel sounds normal Pelvic: As below Extremities: Homans sign is negative, no sign of DVT Presentation: cephalic Fetal monitoringBaseline: 135 bpm, Variability: Fair (1-6 bpm), Accelerations: Reactive, and Decelerations: Absent Uterine activity: irregular/irritable    Prenatal labs: ABO, Rh: AB/Positive/-- (07/30 1619) Antibody: Negative (07/30 1619) Rubella: 0.98 (07/30 1619) RPR: Reactive (08/14 0841)  HBsAg: Negative (07/30 1619)  HIV: Non Reactive (08/14 0841)  GBS: Negative/-- (09/17 1717)    Lab Results  Component Value Date   GBS Negative 12/25/2023   GTT WNL Genetic screening  NIPS low risk, AFP n/a Anatomy US  WNL female  Immunization History  Administered Date(s) Administered   Influenza Whole 01/17/2009   Influenza,inj,Quad PF,6+ Mos 05/18/2020   Td 10/27/2002   Tdap 08/04/2013    Prenatal Transfer Tool  Maternal Diabetes: No Genetic Screening: Normal Maternal Ultrasounds/Referrals: Normal Fetal Ultrasounds or other Referrals:  Referred to Materal Fetal Medicine  Maternal Substance Abuse:  No Significant Maternal Medications:  None Significant Maternal Lab Results: Group B Strep negative Number of Prenatal Visits:greater than 3 verified prenatal visits Maternal Vaccinations: None Other Comments:  None   No results found for this or any previous visit (from the past 24 hours).  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 G2P1001 at [redacted]w[redacted]d here for IOL secondary to post dates, high risk pregnancy.   #Labor: IOL (cytotec, Foley, AROM, pitocin , walking/labor balls) #Pain: Natural management for now, may want epidural later #FWB: Cat 1 #GBS status:  negative  #Ovarian cancer s/p treatment (2022) #R adnexal mass (CT 10/25/23) - has a f/u scheduled with Dr. Viktoria after delivery   #Late to [facility] #Moderate obesity affecting pregnancy #Alpha thalassemia silent carrier #Family hx of chromosomal abnormality - son has chromosome 3 duplication and subsequent seizure disorder  #Gestational thrombocytopenia - monitor CBC  #Maternal syphilis affecting pregnancy (treated) - received 3 IM doses of Bicillin  (completed 11/2023) - send placenta after delivery - recheck RPR  #Feeding: Breastmilk  and Formula #Reproductive Life planning: Nexplanon #Circ: yes  Terrace Compton, MD  01/24/2024, 11:23 AM

## 2024-01-25 ENCOUNTER — Encounter (HOSPITAL_COMMUNITY): Payer: Self-pay | Admitting: Certified Nurse Midwife

## 2024-01-25 ENCOUNTER — Inpatient Hospital Stay (HOSPITAL_COMMUNITY): Admitting: Anesthesiology

## 2024-01-25 DIAGNOSIS — O99214 Obesity complicating childbirth: Secondary | ICD-10-CM | POA: Diagnosis not present

## 2024-01-25 DIAGNOSIS — Z3A41 41 weeks gestation of pregnancy: Secondary | ICD-10-CM

## 2024-01-25 DIAGNOSIS — O9912 Other diseases of the blood and blood-forming organs and certain disorders involving the immune mechanism complicating childbirth: Secondary | ICD-10-CM | POA: Diagnosis not present

## 2024-01-25 DIAGNOSIS — O480 Post-term pregnancy: Secondary | ICD-10-CM | POA: Diagnosis not present

## 2024-01-25 MED ORDER — DIPHENHYDRAMINE HCL 50 MG/ML IJ SOLN
12.5000 mg | INTRAMUSCULAR | Status: DC | PRN
  Administered 2024-01-25: 12.5 mg via INTRAVENOUS
  Filled 2024-01-25: qty 1

## 2024-01-25 MED ORDER — FENTANYL-BUPIVACAINE-NACL 0.5-0.125-0.9 MG/250ML-% EP SOLN
12.0000 mL/h | EPIDURAL | Status: DC | PRN
  Administered 2024-01-25: 12 mL/h via EPIDURAL
  Filled 2024-01-25: qty 250

## 2024-01-25 MED ORDER — ONDANSETRON HCL 4 MG/2ML IJ SOLN: 4.0000 mg | INTRAMUSCULAR | Status: DC | PRN

## 2024-01-25 MED ORDER — TERBUTALINE SULFATE 1 MG/ML IJ SOLN: 0.2500 mg | Freq: Once | INTRAMUSCULAR | Status: DC | PRN

## 2024-01-25 MED ORDER — PRENATAL MULTIVITAMIN CH
1.0000 | ORAL_TABLET | Freq: Every day | ORAL | Status: DC
  Administered 2024-01-26 – 2024-01-27 (×2): 1 via ORAL
  Filled 2024-01-25 (×2): qty 1

## 2024-01-25 MED ORDER — SENNOSIDES-DOCUSATE SODIUM 8.6-50 MG PO TABS
2.0000 | ORAL_TABLET | Freq: Every day | ORAL | Status: DC
  Administered 2024-01-26 – 2024-01-27 (×2): 2 via ORAL
  Filled 2024-01-25 (×2): qty 2

## 2024-01-25 MED ORDER — IBUPROFEN 600 MG PO TABS
600.0000 mg | ORAL_TABLET | Freq: Four times a day (QID) | ORAL | Status: DC
  Administered 2024-01-25 – 2024-01-27 (×6): 600 mg via ORAL
  Filled 2024-01-25 (×8): qty 1

## 2024-01-25 MED ORDER — SIMETHICONE 80 MG PO CHEW: 80.0000 mg | CHEWABLE_TABLET | ORAL | Status: DC | PRN

## 2024-01-25 MED ORDER — TETANUS-DIPHTH-ACELL PERTUSSIS 5-2-15.5 LF-MCG/0.5 IM SUSP: 0.5000 mL | Freq: Once | INTRAMUSCULAR | Status: DC

## 2024-01-25 MED ORDER — DIPHENHYDRAMINE HCL 25 MG PO CAPS: 25.0000 mg | ORAL_CAPSULE | Freq: Four times a day (QID) | ORAL | Status: DC | PRN

## 2024-01-25 MED ORDER — OXYTOCIN-SODIUM CHLORIDE 30-0.9 UT/500ML-% IV SOLN
1.0000 m[IU]/min | INTRAVENOUS | Status: DC
  Administered 2024-01-25: 2 m[IU]/min via INTRAVENOUS
  Filled 2024-01-25: qty 500

## 2024-01-25 MED ORDER — WITCH HAZEL-GLYCERIN EX PADS: 1.0000 | MEDICATED_PAD | CUTANEOUS | Status: DC | PRN

## 2024-01-25 MED ORDER — DIBUCAINE (PERIANAL) 1 % EX OINT: 1.0000 | TOPICAL_OINTMENT | CUTANEOUS | Status: DC | PRN

## 2024-01-25 MED ORDER — SODIUM CHLORIDE 0.9 % IV SOLN
INTRAVENOUS | Status: DC | PRN
  Administered 2024-01-25: 6 mL via EPIDURAL

## 2024-01-25 MED ORDER — EPHEDRINE 5 MG/ML INJ
10.0000 mg | INTRAVENOUS | Status: DC | PRN
Start: 2024-01-25 — End: 2024-01-25

## 2024-01-25 MED ORDER — PHENYLEPHRINE 80 MCG/ML (10ML) SYRINGE FOR IV PUSH (FOR BLOOD PRESSURE SUPPORT)
80.0000 ug | PREFILLED_SYRINGE | INTRAVENOUS | Status: DC | PRN
  Filled 2024-01-25: qty 10

## 2024-01-25 MED ORDER — COCONUT OIL OIL: 1.0000 | TOPICAL_OIL | Status: DC | PRN

## 2024-01-25 MED ORDER — LIDOCAINE HCL (PF) 1 % IJ SOLN
INTRAMUSCULAR | Status: DC | PRN
  Administered 2024-01-25: 3 mL via SUBCUTANEOUS

## 2024-01-25 MED ORDER — ONDANSETRON HCL 4 MG PO TABS: 4.0000 mg | ORAL_TABLET | ORAL | Status: DC | PRN

## 2024-01-25 MED ORDER — PHENYLEPHRINE 80 MCG/ML (10ML) SYRINGE FOR IV PUSH (FOR BLOOD PRESSURE SUPPORT): 80.0000 ug | PREFILLED_SYRINGE | INTRAVENOUS | Status: DC | PRN

## 2024-01-25 MED ORDER — ZOLPIDEM TARTRATE 5 MG PO TABS: 5.0000 mg | ORAL_TABLET | Freq: Every evening | ORAL | Status: DC | PRN

## 2024-01-25 MED ORDER — EPHEDRINE 5 MG/ML INJ: 10.0000 mg | INTRAVENOUS | Status: DC | PRN

## 2024-01-25 MED ORDER — ACETAMINOPHEN 325 MG PO TABS: 650.0000 mg | ORAL_TABLET | ORAL | Status: DC | PRN

## 2024-01-25 MED ORDER — LACTATED RINGERS IV SOLN: 500.0000 mL | Freq: Once | INTRAVENOUS | Status: DC

## 2024-01-25 MED ORDER — BENZOCAINE-MENTHOL 20-0.5 % EX AERO
1.0000 | INHALATION_SPRAY | CUTANEOUS | Status: DC | PRN
  Administered 2024-01-25: 1 via TOPICAL
  Filled 2024-01-25: qty 56

## 2024-01-25 MED ADMIN — Lidocaine Inj 2% w/ Epinephrine-1:200000 (PF): 3 mL | EPIDURAL | NDC 00409318301

## 2024-01-25 NOTE — Anesthesia Preprocedure Evaluation (Signed)
 Anesthesia Evaluation  Patient identified by MRN, date of birth, ID band Patient awake    Reviewed: Allergy & Precautions, NPO status , Patient's Chart, lab work & pertinent test results  History of Anesthesia Complications Negative for: history of anesthetic complications  Airway Mallampati: II  TM Distance: >3 FB Neck ROM: Full    Dental no notable dental hx. (+) Teeth Intact   Pulmonary neg pulmonary ROS, neg sleep apnea, neg COPD, Patient abstained from smoking.Not current smoker   Pulmonary exam normal breath sounds clear to auscultation       Cardiovascular Exercise Tolerance: Good METS(-) hypertension(-) CAD and (-) Past MI negative cardio ROS (-) dysrhythmias  Rhythm:Regular Rate:Normal - Systolic murmurs    Neuro/Psych negative neurological ROS  negative psych ROS   GI/Hepatic ,neg GERD  ,,(+)     (-) substance abuse    Endo/Other  neg diabetes    Renal/GU negative Renal ROS     Musculoskeletal   Abdominal   Peds  Hematology  (+) Blood dyscrasia, anemia Denies blood thinner use or bleeding disorders.    Anesthesia Other Findings Denies blood thinner use or bleeding diatheses. Recent labs reviewed. Past Medical History: 05/18/2013: Anogenital (venereal) warts No date: Ascites No date: Medical history non-contributory No date: Pelvic mass 04/14/2021: Weight gain   Reproductive/Obstetrics (+) Pregnancy                              Anesthesia Physical Anesthesia Plan  ASA: 2  Anesthesia Plan: Epidural   Post-op Pain Management:    Induction:   PONV Risk Score and Plan: 2 and Treatment may vary due to age or medical condition and Ondansetron   Airway Management Planned: Natural Airway  Additional Equipment:   Intra-op Plan:   Post-operative Plan:   Informed Consent: I have reviewed the patients History and Physical, chart, labs and discussed the procedure  including the risks, benefits and alternatives for the proposed anesthesia with the patient or authorized representative who has indicated his/her understanding and acceptance.       Plan Discussed with: Surgeon  Anesthesia Plan Comments: (Discussed R/B/A of neuraxial anesthesia technique with patient: - rare risks of spinal/epidural hematoma, nerve damage, infection - Risk of PDPH - Risk of itching - Risk of nausea and vomiting - Risk of poor block necessitating replacement of epidural. - Risk of allergic reactions. Patient voiced understanding.)        Anesthesia Quick Evaluation

## 2024-01-25 NOTE — Progress Notes (Signed)
 Labor Progress Note Brandy Knight is a 33 y.o. G2P1001 at [redacted]w[redacted]d presenting for IOL for post dates  S: Pt very uncomfortable with contractions  O:  BP (!) 112/57   Pulse 76   Temp 98.7 F (37.1 C) (Oral)   Resp 17   Ht 5' 2 (1.575 m)   Wt 83.1 kg   LMP 05/11/2023 (Approximate)   SpO2 98%   BMI 33.53 kg/m  Lab Results  Component Value Date   HGB 11.1 (L) 01/24/2024    Time: 12:55 AM  FHT: baseline bpm 120, moderate variability, accelerations present, decelerations none,   Contractions: q every 1 min mins,    CVE: Dilation: 4 Effacement (%): 90 Cervical Position: Posterior Station: -2 Presentation: Vertex Exam by:: Dr. Trudy   A&P: 33 y.o. G2P1001 [redacted]w[redacted]d IOL for post dates #Labor: Latent Labor AROM and Cytotec  #Pain: Labor support without medications #FWB: Category I #GBS negative  Leeroy KATHEE Trudy, MD 12:55 AM

## 2024-01-25 NOTE — Progress Notes (Signed)
 Labor Progress Note Brandy Knight is a 33 y.o. G2P1001 at [redacted]w[redacted]d presenting for IOL for post-dates  S: Pt now comfortable with epidural  O:  BP (!) 114/52   Pulse 74   Temp 98.5 F (36.9 C) (Oral)   Resp 17   Ht 5' 2 (1.575 m)   Wt 83.1 kg   LMP 05/11/2023 (Approximate)   SpO2 98%   BMI 33.53 kg/m  Lab Results  Component Value Date   HGB 11.1 (L) 01/24/2024    Time: 6:57 AM  FHT: baseline bpm 130, moderate variability, accelerations absent, decelerations intermittent variables associated with contractions,   Contractions: q 4-5 mins,    CVE: Dilation: 6 Effacement (%): 80 Cervical Position: Middle Station: -2 Presentation: Vertex Exam by:: Dr. Trudy   A&P: 33 y.o. G2P1001 [redacted]w[redacted]d IOL for post-dates #Labor: Active Labor AROM and Cytotec, start pitocin  now #Pain: Epidural #FWB: Category II, overall reassuring #GBS negative  Leeroy KATHEE Trudy, MD 6:57 AM

## 2024-01-25 NOTE — Plan of Care (Signed)
  Problem: Education: Goal: Knowledge of condition will improve Outcome: Progressing Goal: Individualized Educational Video(s) Outcome: Progressing Goal: Individualized Newborn Educational Video(s) Outcome: Progressing   Problem: Activity: Goal: Will verbalize the importance of balancing activity with adequate rest periods Outcome: Progressing Goal: Ability to tolerate increased activity will improve Outcome: Progressing   Problem: Life Cycle: Goal: Chance of risk for complications during the postpartum period will decrease Outcome: Progressing   Problem: Role Relationship: Goal: Ability to demonstrate positive interaction with newborn will improve Outcome: Progressing   Problem: Skin Integrity: Goal: Demonstration of wound healing without infection will improve Outcome: Progressing

## 2024-01-25 NOTE — Discharge Summary (Signed)
 Postpartum Discharge Summary  Date of Service updated***     Patient Name: Brandy Knight DOB: Mar 20, 1991 MRN: [id number]  Date of admission: 01/24/2024 Delivery date:01/25/2024 Delivering provider: MILLY PLANAS A Date of discharge: 01/25/2024  Admitting diagnosis: Pregnant [Z34.90] Intrauterine pregnancy: [redacted]w[redacted]d     Secondary diagnosis:  Principal Problem:   Pregnant Active Problems:   Malignant granulosa cell tumor of ovary, left (HCC)   History of maternal syphilis, currently pregnant, third trimester  Additional problems: *** None   Discharge diagnosis: Term Pregnancy Delivered                                              Post partum procedures:{Postpartum procedures:23558} Augmentation: AROM, Pitocin , and Cytotec Complications: None  Hospital course: Induction of Labor With Vaginal Delivery   33 y.o. yo G2P1001 at [redacted]w[redacted]d was admitted to the hospital 01/24/2024 for induction of labor.  Indication for induction: Postdates.  Patient had an labor course complicated by*** Membrane Rupture Time/Date: 8:50 PM,01/24/2024  Delivery Method:Vaginal, Spontaneous Operative Delivery:{Operative Delivery:30121} Episiotomy: None Lacerations:  2nd degree;Perineal Details of delivery can be found in separate delivery note.  Patient had a postpartum course complicated by***. Patient is discharged home 01/25/24.  Newborn Data: Birth date:01/25/2024 Birth time:1:33 PM Gender:Female Living status:Living Apgars:6 ,9  Weight:   Magnesium Sulfate received: No BMZ received: No Rhophylac:No MMR:No T-DaP:declined Flu: No RSV Vaccine received: No Transfusion:No  Immunizations received: Immunization History  Administered Date(s) Administered   Influenza Whole 01/17/2009   Influenza,inj,Quad PF,6+ Mos 05/18/2020   Td 10/27/2002   Tdap 08/04/2013    Physical exam  Vitals:   01/25/24 1355 01/25/24 1400 01/25/24 1405 01/25/24 1415  BP:   133/69 (!) 145/75  Pulse:   (!)  106 97  Resp:      Temp:      TempSrc:      SpO2: 100% 99% 100%   Weight:      Height:       General: {Exam; general:21111117} Lochia: {Desc; appropriate/inappropriate:30686::appropriate} Uterine Fundus: {Desc; firm/soft:30687} Incision: {Exam; incision:21111123} DVT Evaluation: {Exam; dvt:2111122} Labs: Lab Results  Component Value Date   WBC 11.5 (H) 01/24/2024   HGB 11.1 (L) 01/24/2024   HCT 33.6 (L) 01/24/2024   MCV 82.2 01/24/2024   PLT 152 01/24/2024      Latest Ref Rng & Units 11/06/2023    4:19 PM  CMP  Glucose 70 - 99 mg/dL 80   BUN 6 - 20 mg/dL 8   Creatinine 9.42 - 8.99 mg/dL 9.27   Sodium 865 - 855 mmol/L 133   Potassium 3.5 - 5.2 mmol/L 4.0   Chloride 96 - 106 mmol/L 100   CO2 20 - 29 mmol/L 18   Calcium 8.7 - 10.2 mg/dL 9.8   Total Protein 6.0 - 8.5 g/dL 6.6   Total Bilirubin 0.0 - 1.2 mg/dL 0.2   Alkaline Phos 44 - 121 IU/L 110   AST 0 - 40 IU/L 15   ALT 0 - 32 IU/L 17    Edinburgh Score:     No data to display         No data recorded  After visit meds:  Allergies as of 01/25/2024   No Known Allergies   Med Rec must be completed prior to using this [facility]***        Discharge home in stable  condition Infant Feeding: {Baby feeding:23562} Infant Disposition:{CHL IP OB HOME WITH FNUYZM:76418} Discharge instruction: per After Visit Summary and Postpartum booklet. Activity: Advance as tolerated. Pelvic rest for 6 weeks.  Diet: {OB ipzu:[id number]} Future Appointments: Future Appointments  Date Time Provider Department Center  02/14/2024  3:15 PM Viktoria Comer SAUNDERS, MD [facility] None  03/13/2024  1:15 PM [facility] None   Follow up Visit:  Message sent to [facility] on 01/25/24:  Please schedule this patient for a In person postpartum visit in 6 weeks with the following provider: attending MD. Additional Postpartum F/U:none  High risk pregnancy complicated by: Hx ovarian cancer Delivery mode:  Vaginal,  Spontaneous Anticipated Birth Control:  Nexplanon   01/25/2024 Olam Boards, CNM

## 2024-01-25 NOTE — Progress Notes (Signed)
 Brandy Knight is a 33 y.o. G2P1001 at [redacted]w[redacted]d by 29 weeks US  ultrasound admitted for induction of labor due to postdates.  Hx significant for ovarian cancer s/p left salpingo-oophorectomy and chemo s/p tx for syphilis @ 29 weeks.  Subjective: Pt comfortable with epidural.  Family in room for support.  Objective: BP 110/69   Pulse 79   Temp 98.7 F (37.1 C) (Oral)   Resp 16   Ht 5' 2 (1.575 m)   Wt 83.1 kg   LMP 05/11/2023 (Approximate)   SpO2 98%   BMI 33.53 kg/m  I/O last 3 completed shifts: In: 492.2 [I.V.:492.2] Out: -  No intake/output data recorded.  FHT:  FHR: 125 bpm, variability: moderate,  accelerations:  Present,  decelerations:  Present isolated variable UC:   irregular, every 2-5 minutes SVE:   Dilation: 6 Effacement (%): 80 Station: -2 Exam by:: Dr. Trudy  Labs: Lab Results  Component Value Date   WBC 11.5 (H) 01/24/2024   HGB 11.1 (L) 01/24/2024   HCT 33.6 (L) 01/24/2024   MCV 82.2 01/24/2024   PLT 152 01/24/2024    Assessment / Plan: IOL for postdates On Pitocin  since 0700  Labor: Progressing normally Preeclampsia:  n/a Fetal Wellbeing:  Category I overall with one variable Pain Control:  Epidural I/D:  GBS neg Anticipated MOD:  NSVD  Olam Boards, CNM 01/25/2024, 10:53 AM

## 2024-01-25 NOTE — Anesthesia Procedure Notes (Signed)
 Epidural Patient location during procedure: OB Start time: 01/25/2024 2:40 AM End time: 01/25/2024 2:43 AM  Staffing Anesthesiologist: Boone Fess, MD Performed: anesthesiologist   Preanesthetic Checklist Completed: patient identified, IV checked, site marked, risks and benefits discussed, surgical consent, monitors and equipment checked, pre-op evaluation and timeout performed  Epidural Patient position: sitting Prep: ChloraPrep Patient monitoring: heart rate, continuous pulse ox and blood pressure Approach: midline Location: L3-L4 Injection technique: LOR saline  Needle:  Needle type: Tuohy  Needle gauge: 17 G Needle length: 9 cm Needle insertion depth: 7 cm Catheter type: closed end flexible Catheter size: 19 Gauge Catheter at skin depth: 12 cm Test dose: negative and 1.5% lidocaine  with Epi 1:200 K  Assessment Sensory level: T10 Events: blood not aspirated, no cerebrospinal fluid, injection not painful, no injection resistance, no paresthesia and negative IV test  Additional Notes First/one attempt. Mild scoliosis noted, needle entry to right of midline.  Pt. Evaluated and documentation done after procedure finished. Patient identified. Risks/Benefits/Options discussed with patient including but not limited to bleeding, infection, nerve damage, paralysis, failed block, incomplete pain control, headache, blood pressure changes, nausea, vomiting, reactions to medication both or allergic, itching and postpartum back pain. Confirmed with bedside nurse the patient's most recent platelet count. Confirmed with patient that they are not currently taking any anticoagulation, have any bleeding history or any family history of bleeding disorders. Patient expressed understanding and wished to proceed. All questions were answered. Sterile technique was used throughout the entire procedure. Please see nursing notes for vital signs. Test dose was given through epidural catheter and  negative prior to continuing to dose epidural or start infusion. Warning signs of high block given to the patient including shortness of breath, tingling/numbness in hands, complete motor block, or any concerning symptoms with instructions to call for help. Patient was given instructions on fall risk and not to get out of bed. All questions and concerns addressed with instructions to call with any issues or inadequate analgesia.     Patient tolerated the insertion well without immediate complications.  Reason for block: procedure for painReason for block:procedure for pain

## 2024-01-26 LAB — CBC
HCT: 27.4 % — ABNORMAL LOW (ref 36.0–46.0)
Hemoglobin: 9.1 g/dL — ABNORMAL LOW (ref 12.0–15.0)
MCH: 27.5 pg (ref 26.0–34.0)
MCHC: 33.2 g/dL (ref 30.0–36.0)
MCV: 82.8 fL (ref 80.0–100.0)
Platelets: 118 K/uL — ABNORMAL LOW (ref 150–400)
RBC: 3.31 MIL/uL — ABNORMAL LOW (ref 3.87–5.11)
RDW: 14.1 % (ref 11.5–15.5)
WBC: 17.5 K/uL — ABNORMAL HIGH (ref 4.0–10.5)
nRBC: 0 % (ref 0.0–0.2)

## 2024-01-26 MED ORDER — FERROUS SULFATE 325 (65 FE) MG PO TABS
325.0000 mg | ORAL_TABLET | Freq: Every day | ORAL | Status: DC
  Administered 2024-01-26 – 2024-01-27 (×2): 325 mg via ORAL
  Filled 2024-01-26 (×2): qty 1

## 2024-01-26 NOTE — Anesthesia Postprocedure Evaluation (Signed)
 Anesthesia Post Note  Patient: Brandy Knight  Procedure(s) Performed: AN AD HOC LABOR EPIDURAL     Patient location during evaluation: Mother Baby Anesthesia Type: Epidural Level of consciousness: awake and alert Pain management: pain level controlled Vital Signs Assessment: post-procedure vital signs reviewed and stable Respiratory status: spontaneous breathing, nonlabored ventilation and respiratory function stable Cardiovascular status: stable Postop Assessment: no headache, no backache, epidural receding, no apparent nausea or vomiting, patient able to bend at knees, able to ambulate and adequate PO intake Anesthetic complications: no   No notable events documented.  Last Vitals:  Vitals:   01/26/24 0156 01/26/24 0600  BP: 103/65 108/60  Pulse: 88 60  Resp: 16 18  Temp: 36.9 C 36.7 C  SpO2:      Last Pain:  Vitals:   01/26/24 0757  TempSrc:   PainSc: 0-No pain   Pain Goal:                Epidural/Spinal Function Cutaneous sensation: Normal sensation (01/26/24 0757), Patient able to flex knees: Yes (01/26/24 0757), Patient able to lift hips off bed: Yes (01/26/24 0757), Back pain beyond tenderness at insertion site: No (01/26/24 0757), Progressively worsening motor and/or sensory loss: No (01/26/24 0757), Bowel and/or bladder incontinence post epidural: No (01/26/24 0757)  [name]

## 2024-01-26 NOTE — Progress Notes (Signed)
   CIRCUMCISION CONSENT  Clotilda JONELLE Balloon and SO expresses desire for infant circumcision.  Informed that [facility] can perform said procedure and circumcision procedure details discussed.    -It was emphasized that this is an elective procedure.   -Risks and benefits of procedure were reviewed including, but not limited to:  *Benefits include reduction in the rates of urinary tract infection (UTI), penile cancer, some sexually transmitted infections, penile inflammatory, and retractile disorders, as well as easier hygiene.   *Risks include bleeding, infection, injury of glans which may lead to need for additional surgery, penile deformity, or urinary tract issues, unsatisfactory cosmetic appearance and other potential complications related to the procedure.   -Informed that procedure will not be performed if provider deems inappropriate d/t penile size, noted deformity, or unsatisfactory pediatric evaluation. -Patient wants to proceed with circumcision. -Circumcision to be done pending pediatric evaluation of infant.  -Post circumcision care discussed. -L&D Team updated  Harlene LITTIE Duncans MSN, CNM Advanced Practice Provider, Center for [facility] 01/26/2024 7:17 AM

## 2024-01-26 NOTE — Progress Notes (Signed)
 Patient ID: Brandy Knight, female   DOB: [birth year], 33 y.o.   MRN: [id number]  Post Partum Day One:S/P VD  Subjective: Patient up ad lib, denies syncope or dizziness. Reports consuming regular diet without issues and denies N/V. Denies issues with urination and reports bleeding is light like it is going away.  Patient is bottlefeeding and denies breast or nipple pain.  Desires Nexplanon for postpartum contraception.  Pain is being appropriately managed with use of ibuprofen .  Objective: Vitals:   01/25/24 1737 01/25/24 2200 01/26/24 0156 01/26/24 0600  BP: 105/66 114/60 103/65 108/60  Pulse:  90 88 60  Resp: 17 16 16 18   Temp: 99.4 F (37.4 C) 99.3 F (37.4 C) 98.4 F (36.9 C) 98 F (36.7 C)  TempSrc: Oral Axillary Oral Oral  SpO2: 100%     Weight:      Height:       Recent Labs    01/24/24 1153 01/26/24 0500  HGB 11.1* 9.1*  HCT 33.6* 27.4*    Physical Exam:  General: alert, cooperative, and no distress Mood/Affect: Appropriate/Appropriate Lungs: clear to auscultation, no wheezes, rales or rhonchi, symmetric air entry.  Heart: normal rate and regular rhythm. Breast: breasts appear normal, no suspicious masses, no skin or nipple changes or axillary nodes, not examined. Abdomen:  + bowel sounds, Soft Uterine Fundus: firm at umbilicus Lochia: Not assessed Laceration: 2nd Degree Perineal Skin: Warm, Dry DVT Evaluation: No evidence of DVT seen on physical exam. No significant calf/ankle edema.  Assessment S/P Vaginal Delivery-Day One Normal Involution BottleFeeding Asymptomatic Anemia  Plan: -Started on iron supplement. Plan for daily dosing while inpatient then every other at home.  -Reviewed plan for discharge tomorrow.  -Continue current care. -L&D team to be updated on patient status.   Harlene LITTIE Duncans, MSN, CNM 01/26/2024, 7:09 AM

## 2024-01-26 NOTE — Lactation Note (Signed)
 This note was copied from a baby's chart. Lactation Consultation Note  Patient Name: Brandy Knight Unijb'd Date: 01/26/2024 Age:35 hours   Attempted to see mom but she was sleeping, rm. Dark. Support person laying down.   Maternal Data    Feeding Nipple Type: Slow - flow  LATCH Score                    Lactation Tools Discussed/Used    Interventions    Discharge    Consult Status      Cheyeanne Roadcap G 01/26/2024, 12:05 AM

## 2024-01-26 NOTE — Lactation Note (Signed)
 This note was copied from a baby's chart. Lactation Consultation Note  Patient Name: [name] Unijb'd Date: 01/26/2024 Age:35 hours    Spoke with mother. She states she plans to only formula feed for now.  If decides she want to pump, she will call.    Consult Status Consult Status: Complete  Hajra Levorn Lemme  RN, IBCLC 01/26/2024, 8:42 AM

## 2024-01-27 LAB — T.PALLIDUM AB, TOTAL: T Pallidum Abs: REACTIVE — AB

## 2024-01-27 MED ORDER — SENNOSIDES-DOCUSATE SODIUM 8.6-50 MG PO TABS: 2.0000 | ORAL_TABLET | Freq: Every day | ORAL | 0 refills | Status: AC

## 2024-01-27 MED ORDER — IBUPROFEN 600 MG PO TABS: 600.0000 mg | ORAL_TABLET | Freq: Four times a day (QID) | ORAL | 0 refills | Status: AC

## 2024-01-27 MED ORDER — ACETAMINOPHEN 325 MG PO TABS: 650.0000 mg | ORAL_TABLET | ORAL | Status: AC | PRN

## 2024-01-27 MED ORDER — FERROUS SULFATE 325 (65 FE) MG PO TABS: 325.0000 mg | ORAL_TABLET | Freq: Every day | ORAL | 3 refills | Status: AC

## 2024-01-27 NOTE — Patient Instructions (Signed)
 If interested in an outpatient lactation consult in office or virtually please reach out to us  at [facility] (First Floor) [address] [place]  Please feel free to out with any lactation related questions or concerns [phone]  to leave a message for our lactation voicemail box.  Lactation support groups:  [facility], Tuesdays 10:00 am -12:00 pm at [address] on the second floor in the conference room, lactating parents and lap babies welcome.  [email or web address]  [email or web address]  Si est interesado en una consulta ambulatoria sobre lactancia en el consultorio o virtualmente, comunquese con nosotros al [facility] scientist) [address] [place], [place] No dude en comunicarse con cualquier pregunta o inquietud relacionada con la lactancia al [phone] para dejar un mensaje en nuestro buzn de voz de lactancia  Grupos de apoyo para la lactancia:  [facility], martes de 10:00 a. m. a 12:00 p. m., en [address], segundo piso, sala de conferencias. Se admiten madres lactantes y bebs en regazo.      Delaney Mandril, [facility] for [facility]

## 2024-01-28 LAB — SURGICAL PATHOLOGY

## 2024-02-06 ENCOUNTER — Ambulatory Visit: Admitting: Gynecologic Oncology

## 2024-02-08 ENCOUNTER — Telehealth (HOSPITAL_COMMUNITY): Payer: Self-pay | Admitting: *Deleted

## 2024-02-08 NOTE — Telephone Encounter (Signed)
 02/08/2024  Name: Brandy Knight MRN: [id number] DOB: 05/06/1990  Reason for Call:  Transition of Care Hospital Discharge Call  Contact Status: Patient Contact Status: Complete  Language assistant needed:          Follow-Up Questions: Do You Have Any Concerns About Your Health As You Heal From Delivery?: No Do You Have Any Concerns About Your Infants Health?: No  Edinburgh Postnatal Depression Scale:  In the Past 7 Days: I have been able to laugh and see the funny side of things.: As much as I always could I have looked forward with enjoyment to things.: As much as I ever did I have blamed myself unnecessarily when things went wrong.: No, never I have been anxious or worried for no good reason.: No, not at all I have felt scared or panicky for no good reason.: No, not at all Things have been getting on top of me.: No, I have been coping as well as ever I have been so unhappy that I have had difficulty sleeping.: Not at all I have felt sad or miserable.: No, not at all I have been so unhappy that I have been crying.: No, never The thought of harming myself has occurred to me.: Never Van Postnatal Depression Scale Total: 0  PHQ2-9 Depression Scale:     Discharge Follow-up: Edinburgh score requires follow up?: No Patient was advised of the following resources:: Breastfeeding Support Group, Support Group Declined email information at this time. Post-discharge interventions: Reviewed Newborn Safe Sleep Practices  Signature Allean IVAR Carton, RN, 02/08/24, [phone]

## 2024-02-14 ENCOUNTER — Inpatient Hospital Stay: Admitting: Gynecologic Oncology

## 2024-02-14 DIAGNOSIS — D3911 Neoplasm of uncertain behavior of right ovary: Secondary | ICD-10-CM

## 2024-02-14 NOTE — Progress Notes (Unsigned)
Patient canceled same day

## 2024-02-19 ENCOUNTER — Other Ambulatory Visit: Payer: Self-pay | Admitting: Gynecologic Oncology

## 2024-02-19 ENCOUNTER — Ambulatory Visit (HOSPITAL_COMMUNITY)
Admission: RE | Admit: 2024-02-19 | Discharge: 2024-02-19 | Disposition: A | Discharge status: Home / Self Care | Source: Ambulatory Visit | Attending: Gynecologic Oncology | Admitting: Gynecologic Oncology

## 2024-02-19 DIAGNOSIS — D3911 Neoplasm of uncertain behavior of right ovary: Secondary | ICD-10-CM

## 2024-02-20 ENCOUNTER — Inpatient Hospital Stay

## 2024-02-20 ENCOUNTER — Inpatient Hospital Stay: Attending: Gynecologic Oncology | Admitting: Gynecologic Oncology

## 2024-02-20 ENCOUNTER — Encounter: Payer: Self-pay | Admitting: Gynecologic Oncology

## 2024-02-20 VITALS — BP 124/61 | HR 60 | Temp 98.6°F | Resp 18 | Wt 180.0 lb

## 2024-02-20 DIAGNOSIS — D3911 Neoplasm of uncertain behavior of right ovary: Secondary | ICD-10-CM

## 2024-02-20 DIAGNOSIS — Z9221 Personal history of antineoplastic chemotherapy: Secondary | ICD-10-CM | POA: Insufficient documentation

## 2024-02-20 DIAGNOSIS — Z8543 Personal history of malignant neoplasm of ovary: Secondary | ICD-10-CM | POA: Insufficient documentation

## 2024-02-20 DIAGNOSIS — Z90721 Acquired absence of ovaries, unilateral: Secondary | ICD-10-CM | POA: Diagnosis not present

## 2024-02-20 NOTE — Progress Notes (Signed)
 Gynecologic Oncology Return Clinic Visit  02/20/24  Reason for Visit: surveillance  Treatment History: Oncology History Overview Note  1/14: inhibin B - 3,543 Inhibin A - 496  Presented initially on 04/21/20 with suspected pregnancy, increasing abdominal growth. 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   Malignant granulosa cell tumor of ovary, left (HCC)  04/21/2020 Imaging   CT A/P: 1. 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 Ovarian lesion is typically a fibroma in this setting. There is a cleavage plane between the adnexal mass and the uterus, which makes a pedunculated fibroid unlikely. 2. There is a probable nabothian cyst in the cervix. 3. Ascites causes mass effect on the abdominopelvic structures   04/22/2020 Procedure   Paracentesis, 9L  FINAL MICROSCOPIC DIAGNOSIS:  - Atypical cells present  - See comment.    04/23/2020 Procedure   Thoracentesis, 2L  FINAL MICROSCOPIC DIAGNOSIS:  - Reactive mesothelial cells present   04/27/2020 Surgery   Robotic-assisted lysis of adhesions, left salpingoo-oophorectomy, laparotomy for specimen removal, ileocecal resection and reanastomosis, appendectomy, omentectomy  Findings: On EUA, small mobile uterus with mass that moves in junction with the uterus, sitting out of the pelvis and palpable with the abdominal hand. Mass is firm and adherent to the anterior abdominal wall. On intra-abdominal entry, approximately 6L of green-tinged ascites noted and removed. Normal appearing upper abdominal survey. Normal appearing omentum. 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 zsome pockets of degeneration that are unavoidably entered during gentle manipulation of the ovary. Mass with dense adhesions to the anterior abdominal wall  on the right, the right sidewall, and several loops of ileum. Uterus 8cm and normal appearing. Right fallopian tube and ovary without evidence of disease. Some inflammatory rind noted on the small and large bowel. Given size of mass and its suspected degeneration and poor integrity, unable to place in an Endocatch bag and midline excision extended for specimen removal. Given concern for thermal damage to the terminal ileum from lyssi of dense adhesions, decision made to proceed with ileocectomy. 1-1.5cm mesenteric node palpated within the mesentery removed. Some shotty sub centimeter mesenteric nodes palpated. No para-aortic or pelvic lymphadenopathy. Liver edge, diaphragm and stomach smooth. Omentum without evidence of disease. An additional 3L of ascites was removed over the course of the surgery.   04/27/2020 Pathology Results   A. OVARY AND FALLOPIAN TUBE, LEFT, SALPINGO OOPHORECTOMY:  - Granulosa cell tumor, adult type, multiple fragments measuring 18.5 cm  in aggregate  - No evidence of ovarian surface involvement  - Benign unremarkable fallopian tube  - See oncology table   B. ILEOCECUM AND APPENDIX, RESECTION:  - Segment of terminal ileum (9 cm) and colon (10 cm) showing focal  serosal disruption  - Unremarkable appendix  - Margins appear viable  - Benign lymph nodes  - No evidence of malignancy   C. OMENTUM, RESECTION:  - Portion of omentum with congestion and mild mesothelial hyperplasia  - No evidence of malignancy   OVARY or FALLOPIAN TUBE or PRIMARY PERITONEUM: Resection   Procedure: Salpingo-oophorectomy  Specimen Integrity: Fragmented  Tumor Site: Left ovary  Tumor Size: Multiple tissue fragments measured 18.5 cm in aggregate  Histologic Type: Granulosa cell tumor, adult type  Histologic Grade: Not applicable  Ovarian Surface Involvement: Not identified  Fallopian Tube Surface Involvement: Not identified  Implants (required for advanced stage serous/seromucinous  borderline  tumors only): Not applicable  Other Tissue/ Organ Involvement: Not applicable  Largest Extrapelvic Peritoneal Focus: Not identified  Peritoneal/Ascitic Fluid Involvement: Not identified ([id number])  Chemotherapy Response Score (CRS): Not applicable, no known presurgical  therapy  Regional Lymph Nodes: Not applicable (no lymph nodes submitted or found)  Distant Metastasis:       Distant Site(s) Involved: Not applicable  Pathologic Stage Classification (pTNM, AJCC 8th Edition): pT1c, pN not  assigned (no nodes submitted or found).  See comment  Ancillary Studies: Can be performed upon request  Representative Tumor Block: A2  Comment(s): The tumor stage is due to intraoperative surgical spill of  the tumor cells based on discussion with the surgeon Dr. Comer Dollar.   04/27/2020 Cancer Staging   Staging form: Ovary, Fallopian Tube, and Primary Peritoneal Carcinoma, AJCC 8th Edition - Clinical stage from 04/27/2020: FIGO Stage IC1, calculated as Stage IC (cT1c1, cN0, cM0) - Signed by Dollar Comer SAUNDERS, MD on 05/05/2020   05/05/2020 Initial Diagnosis   Granulosa cell tumor of ovary, right   05/30/2020 - 10/12/2020 Chemotherapy    Patient is on Treatment Plan: OVARIAN CARBOPLATIN  (AUC 6) / PACLITAXEL  (175) Q21D X 6 CYCLES       06/21/2020 Tumor Marker   Patient's tumor was tested for the following markers: Inhibin B Results of the tumor marker test revealed 17.5   10/13/2020 Imaging   Negative. No evidence of recurrent or metastatic carcinoma within the abdomen or pelvis.   10/25/2023 Imaging   CT ABDOMEN PELVIS W CONTRAST Result Date: 10/25/2023 CLINICAL DATA:  History of left ovarian granulosa cell tumor. New onset bloating and firmness to palpation. * Tracking Code: BO * EXAM: CT ABDOMEN AND PELVIS WITH CONTRAST TECHNIQUE: Multidetector CT imaging of the abdomen and pelvis was performed using the standard protocol following bolus administration of intravenous contrast.  RADIATION DOSE REDUCTION: This exam was performed according to the departmental dose-optimization program which includes automated exposure control, adjustment of the mA and/or kV according to patient size and/or use of iterative reconstruction technique. CONTRAST:  OMNIPAQUE  IOHEXOL  300 MG/ML  SOLN COMPARISON:  CT abdomen and pelvis dated 10/13/2020 FINDINGS: Lower chest: No focal consolidation or pulmonary nodule in the lung bases. No pleural effusion or pneumothorax demonstrated. Partially imaged heart size is normal. Hepatobiliary: Ill-defined subcentimeter hyperdensity in segment 7/8 (2:13) and 4/8 (2:17), likely perfusional. No intra or extrahepatic biliary ductal dilation. Normal gallbladder. Pancreas: No focal lesions or main ductal dilation. Spleen: Normal in size without focal abnormality. Adrenals/Urinary Tract: No adrenal nodules. No suspicious renal mass, calculi or hydronephrosis. Mild prominence of bilateral renal collecting systems, likely secondary to gravid uterus. No focal bladder wall thickening. Stomach/Bowel: Normal appearance of the stomach. No evidence of bowel wall thickening, distention, or inflammatory changes. Appendix is not discretely seen. Nodular focus adjacent to the cecum measuring 1.7 x 1.2 cm (2:51), may represent the collapsed appendix. Vascular/Lymphatic: Markedly enlarged right ovarian vein (2:45). Prominent bilateral pelvic vessels. No enlarged abdominal or pelvic lymph nodes. Reproductive: Gravid uterus. Intrauterine gestation in breech presentation. Posterior placenta with suggestion of low lying placental margin. Within the right adnexa is a lobulated soft tissue density measuring 7.7 x 3.8 cm (2:57). Prior left salpingo oophorectomy. No definite left adnexal mass, allowing for physiologically enlarged vasculature. Other: No free fluid, fluid collection, or free air. Musculoskeletal: No acute or abnormal lytic or blastic osseous lesions. IMPRESSION: 1. Gravid uterus  with intrauterine gestation in breech presentation. Posterior placenta with suggestion of low  lying placental margin. Recommend consultation to obstetrics for establishment of prenatal care and for further evaluation of the placenta. 2. Lobulated soft tissue density within the right adnexa may represent continuation of the markedly enlarged right ovarian vein, likely physiologic in the setting of pregnancy. The right ovary is not definitely seen. 3. No definite left adnexal mass, allowing for physiologically enlarged vasculature. 4. Appendix is not discretely seen. Nodular focus adjacent to the cecum measuring 1.7 x 1.2 cm, may represent the collapsed appendix. Recommend attention on follow-up postnatally. These results will be called to the ordering clinician or representative by the Radiologist Assistant, and communication documented in the PACS or [employer]. Electronically Signed   By: Limin  Xu M.D.   On: 10/25/2023 17:40        Interval History: Doing well.  Bleeding has stopped since delivery.  Pumping.  Reports normal bowel and bladder function.  Denies any abdominal or pelvic pain.  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   Ascites    Medical history non-contributory    Pelvic mass    Weight gain 04/14/2021    Past Surgical History:  Procedure Laterality Date   IR PARACENTESIS  04/22/2020   LAPAROTOMY N/A 04/27/2020   Procedure: MINI LAPAROTOMY, ILEOCECAL RESECTION, APPENDECTOMY;  Surgeon: Viktoria Comer SAUNDERS, MD;  Location: [facility];  Service: Gynecology;  Laterality: N/A;   ROBOTIC ASSISTED SALPINGO OOPHERECTOMY Right 04/27/2020   Procedure: XI ROBOTIC ASSISTED UNITLATERAL SALPINGO OOPHORECTOMY;  Surgeon: Viktoria Comer SAUNDERS, MD;  Location: [facility];  Service: Gynecology;  Laterality: Right;   THORACENTESIS     WISDOM TOOTH EXTRACTION      Family History  Problem Relation Age of Onset   Diabetes Mother     Diabetes Father    Seizures Sister    Myasthenia gravis Sister    ADD / ADHD Sister    Ovarian cancer Neg Hx    Uterine cancer Neg Hx    Breast cancer Neg Hx    Colon cancer Neg Hx     Social History   Socioeconomic History   Marital status: Single    Spouse name: Not on file   Number of children: 1   Years of education: Not on file   Highest education level: GED or equivalent  Occupational History    Employer: [employer]  Tobacco Use   Smoking status: Never   Smokeless tobacco: Never  Vaping Use   Vaping status: Never Used  Substance and Sexual Activity   Alcohol use: No   Drug use: No   Sexual activity: Not Currently    Birth control/protection: None  Other Topics Concern   Not on file  Social History Narrative   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)   Social Drivers of Health   Financial Resource Strain: Low Risk  (11/18/2023)   Overall Financial Resource Strain (CARDIA)    Difficulty of Paying Living Expenses: Not very hard  Food Insecurity: No Food Insecurity (01/24/2024)   Hunger Vital Sign    Worried About Running Out of Food in the Last Year: Never true    Ran Out of Food in the Last Year: Never true  Transportation Needs: No Transportation Needs (01/24/2024)   PRAPARE - Administrator, Civil Service (Medical): No    Lack of Transportation (Non-Medical): No  Physical Activity: Insufficiently Active (11/18/2023)   Exercise Vital Sign    Days of Exercise per Week: 2 days  Minutes of Exercise per Session: 20 min  Stress: No Stress Concern Present (11/18/2023)   [employer] of Occupational Health - Occupational Stress Questionnaire    Feeling of Stress: Not at all  Social Connections: Moderately Isolated (11/18/2023)   Social Connection and Isolation Panel    Frequency of Communication with Friends and Family: More than three times a week    Frequency of Social Gatherings with Friends and Family: Twice a week    Attends Religious Services: 1 to 4  times per year    Active Member of [employer] or Organizations: No    Attends Engineer, Structural: Not on file    Marital Status: Never married    Current Medications:  Current Outpatient Medications:    acetaminophen  (TYLENOL ) 325 MG tablet, Take 2 tablets (650 mg total) by mouth every 4 (four) hours as needed (for pain scale < 4)., Disp: , Rfl:    ferrous sulfate 325 (65 FE) MG tablet, Take 1 tablet (325 mg total) by mouth daily with breakfast., Disp: 30 tablet, Rfl: 3   ibuprofen  (ADVIL ) 600 MG tablet, Take 1 tablet (600 mg total) by mouth every 6 (six) hours. (Patient not taking: Reported on 02/11/2024), Disp: 30 tablet, Rfl: 0   Prenatal Vit-Fe Fumarate-FA (PRENATAL MULTIVITAMIN) TABS tablet, Take 1 tablet by mouth daily at 12 noon., Disp: , Rfl:    senna-docusate (SENOKOT-S) 8.6-50 MG tablet, Take 2 tablets by mouth daily., Disp: 60 tablet, Rfl: 0  Review of Systems: Denies appetite changes, fevers, chills, fatigue, unexplained weight changes. Denies hearing loss, neck lumps or masses, mouth sores, ringing in ears or voice changes. Denies cough or wheezing.  Denies shortness of breath. Denies chest pain or palpitations. Denies leg swelling. Denies abdominal distention, pain, blood in stools, constipation, diarrhea, nausea, vomiting, or early satiety. Denies pain with intercourse, dysuria, frequency, hematuria or incontinence. Denies hot flashes, pelvic pain, vaginal bleeding or vaginal discharge.   Denies joint pain, back pain or muscle pain/cramps. Denies itching, rash, or wounds. Denies dizziness, headaches, numbness or seizures. Denies swollen lymph nodes or glands, denies easy bruising or bleeding. Denies anxiety, depression, confusion, or decreased concentration.  Physical Exam: BP 124/61 (BP Location: Left Arm, Patient Position: Sitting)   Pulse 60   Temp 98.6 F (37 C) (Oral)   Resp 18   Wt 180 lb (81.6 kg)   LMP 05/11/2023 (Approximate)   SpO2 100%   BMI 32.92  kg/m  General: Alert, oriented, no acute distress. HEENT: Normocephalic, atraumatic, sclera anicteric. Chest: Clear to auscultation bilaterally.  No wheezes or rhonchi. Cardiovascular: Regular rate and rhythm, no murmurs. Abdomen: obese, soft, nontender.  Normoactive bowel sounds.  No masses or hepatosplenomegaly appreciated.  Well-healed scars. Extremities: Grossly normal range of motion.  Warm, well perfused.  No edema bilaterally. Skin: No rashes or lesions noted. Lymphatics: No cervical, supraclavicular, or inguinal adenopathy. GU: Normal appearing external genitalia without erythema, excoriation, or lesions.  On bimanual exam, cervix is still 1-2 cm dilated, soft.  Uterus enlarged, mobile.  No adnexal masses appreciated.  Laboratory & Radiologic Studies:          Component Ref Range & Units (hover) 8 mo ago (05/31/23) 1 yr ago (01/25/23) 1 yr ago (07/20/22) 2 yr ago (10/12/21) 2 yr ago (07/17/21) 2 yr ago (04/14/21) 3 yr ago (08/22/20)  Inhibin B 10.4 97.2 CM 163.9 CM 69.7 CM <7.0 CM 80.4 CM 132.7 CM   Pelvic ultrasound 02/19/24: ULTRASOUND FINDINGS: UTERUS: Mildly enlarged uterus compatible with  recent postpartum stay. The uterus measures 15 x 6 x 9 cm with a volume of 421 ml. Uterus demonstrates normal myometrial echotexture.   ENDOMETRIAL STRIPE: Endometrium measures 8 mm. Endometrial stripe is within normal limits.   RIGHT OVARY: Right ovary measures 3.5 x 2.2 x 2.6 cm. 10 ml volume. Right ovary is within normal limits.   LEFT OVARY: Surgically absent left ovary.   FREE FLUID: Trace free fluid in the pelvis.   LIMITATIONS: The patient refused transvaginal portion of the exam.   IMPRESSION: 1. Postpartum uterus. 2. Surgically absent left ovary.  Assessment & Plan: Brandy Knight is a 33 y.o. 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  Finished adjuvant chemotherapy in 10/2020.  S/p SVD on 01/25/24   Patient is overall  doing very well and is NED on exam today.  She was congratulated on recent delivery.  We will plan to repeat an inhibin B today as this was a tumor marker for her prior to surgery (3,532).   I will see the patient back for follow-up in 5 months.  Discussed ultrasound from yesterday which shows normal-appearing right ovary, postpartum uterus.  We will get imaging if elevation of her tumor marker or symptoms suggest possible recurrence.     We discussed signs and symptoms that would be concerning for recurrence today including abnormal uterine bleeding.  She knows to call the clinic if she develops any of these before her next scheduled visit.  20 minutes of total time was spent for this patient encounter, including preparation, face-to-face counseling with the patient and coordination of care, and documentation of the encounter.  Comer Dollar, MD  Division of Gynecologic Oncology  Department of Obstetrics and Gynecology  [facility]

## 2024-02-20 NOTE — Patient Instructions (Signed)
 It was good to see you today.  I do not see or feel any evidence of cancer recurrence on your exam.  I will see you for follow-up in 5 months.  As always, if you develop any new and concerning symptoms before your next visit, please call to see me sooner.

## 2024-02-21 ENCOUNTER — Ambulatory Visit: Payer: Self-pay | Admitting: Gynecologic Oncology

## 2024-02-21 LAB — INHIBIN B: Inhibin B: 18.4 pg/mL

## 2024-03-11 ENCOUNTER — Other Ambulatory Visit: Payer: Self-pay

## 2024-03-11 ENCOUNTER — Ambulatory Visit (INDEPENDENT_AMBULATORY_CARE_PROVIDER_SITE_OTHER): Admitting: Certified Nurse Midwife

## 2024-03-11 DIAGNOSIS — Z3042 Encounter for surveillance of injectable contraceptive: Secondary | ICD-10-CM

## 2024-03-11 DIAGNOSIS — Z8619 Personal history of other infectious and parasitic diseases: Secondary | ICD-10-CM

## 2024-03-11 MED ORDER — MEDROXYPROGESTERONE ACETATE 150 MG/ML IM SUSY
150.0000 mg | PREFILLED_SYRINGE | Freq: Once | INTRAMUSCULAR | Status: AC
  Administered 2024-03-11: 150 mg via INTRAMUSCULAR

## 2024-03-11 NOTE — Progress Notes (Unsigned)
 Postpartum Visit Note  Brandy Knight is a 33 y.o. [id number] female who presents for a postpartum visit. She is 6.4 weeks postpartum following a normal spontaneous vaginal delivery.  I have fully reviewed the prenatal and intrapartum course. The delivery was at 41.1 gestational weeks.  Anesthesia: epidural. Postpartum course has been uncomplicated physically. Baby is doing well. Baby is feeding by bottle - Similac Advance. Bleeding no bleeding. Bowel function is normal. Bladder function is normal. Patient is not sexually active. Contraception method is Depo-Provera  injections. Postpartum depression screening: negative.    The pregnancy intention screening data noted above was reviewed. Potential methods of contraception were discussed. The patient elected to proceed with No data recorded.   Edinburgh Postnatal Depression Scale - 03/11/24 0951       Edinburgh Postnatal Depression Scale:  In the Past 7 Days   I have been able to laugh and see the funny side of things. 0    I have looked forward with enjoyment to things. 0    I have blamed myself unnecessarily when things went wrong. 0    I have been anxious or worried for no good reason. 0    I have felt scared or panicky for no good reason. 0    Things have been getting on top of me. 0    I have been so unhappy that I have had difficulty sleeping. 0    I have felt sad or miserable. 0    I have been so unhappy that I have been crying. 0    The thought of harming myself has occurred to me. 0    Edinburgh Postnatal Depression Scale Total 0          Health Maintenance Due  Topic Date Due   COVID-19 Vaccine (1) Never done   Hepatitis B Vaccines 19-59 Average Risk (1 of 3 - 19+ 3-dose series) Never done   HPV VACCINES (1 - Risk 3-dose SCDM series) Never done   DTaP/Tdap/Td (3 - Td or Tdap) 08/05/2023    The following portions of the patient's history were reviewed and updated as appropriate: allergies, current medications, past family  history, past medical history, past social history, past surgical history, and problem list.  Review of Systems Pertinent items noted in HPI and remainder of comprehensive ROS otherwise negative.  Objective:  BP 113/70   Pulse 65   Wt 173 lb 4.8 oz (78.6 kg)   LMP 05/11/2023 (Approximate)   Breastfeeding No   BMI 31.70 kg/m   Constitutional: Alert, oriented female in no physical distress.  HEENT: PERRLA Skin: normal color and turgor, no rash Cardiovascular: normal rate & rhythm Respiratory: normal effort, no problems with respiration noted GI: Abd soft, non-tender MS: Extremities nontender, no edema, normal ROM Neurologic: Alert and oriented x 4.  GU: no CVA tenderness Pelvic: perineum healing well, per pt, exam declined      Assessment:  1. Postpartum care and examination - Recovering well, milk involusion occurred without issue  2. 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  Mood and well being: Patient with negative depression screening today. Reviewed local resources for support.  - Patient tobacco use? No.   - hx of drug use? No.    2. Infant care and feeding:  -Patient currently breastmilk feeding? No.  -Social determinants of health (SDOH) reviewed in EPIC. No concerns   3. Sexuality, contraception and birth spacing -  Patient does not want a pregnancy in the next year.  Desired family size is 2 children.  - Reviewed reproductive life planning. Reviewed contraceptive methods based on pt preferences and effectiveness.  Patient desired Hormonal Injection today.   - Discussed birth spacing of 18 months  4. Sleep and fatigue -Encouraged family/partner/community support of 4 hrs of uninterrupted sleep to help with mood and fatigue  5. Physical Recovery  - Discussed patients delivery and complications. She describes her labor as good. - Patient had a Vaginal, no problems at delivery. Patient had a  2nd degree laceration. Perineal healing reviewed. Patient expressed understanding - Patient has urinary incontinence? No. - Patient is safe to resume physical and sexual activity  6.  Health Maintenance - HM due items addressed Yes - Last pap smear  Diagnosis  Date Value Ref Range Status  04/26/2020   Final   - Negative for Intraepithelial Lesions or Malignancy (NILM)  04/26/2020 - Benign reactive/reparative changes  Final   Pap smear not done at today's visit.  -Breast Cancer screening indicated? No.   7. Chronic Disease/Pregnancy Condition follow up: None - PCP follow up as needed  Cornell JONELLE Finder, CNM [facility] [employer], [facility]

## 2024-03-13 ENCOUNTER — Inpatient Hospital Stay: Attending: Gynecologic Oncology

## 2024-03-13 ENCOUNTER — Ambulatory Visit: Admitting: Gynecologic Oncology

## 2024-03-16 ENCOUNTER — Inpatient Hospital Stay: Attending: Gynecologic Oncology

## 2024-03-18 ENCOUNTER — Inpatient Hospital Stay: Attending: Gynecologic Oncology

## 2024-03-20 ENCOUNTER — Other Ambulatory Visit: Payer: Medicaid Other

## 2024-03-20 ENCOUNTER — Ambulatory Visit: Payer: Medicaid Other | Admitting: Gynecologic Oncology

## 2024-05-27 ENCOUNTER — Ambulatory Visit

## 2024-07-30 ENCOUNTER — Inpatient Hospital Stay: Admitting: Gynecologic Oncology

## 2024-07-30 ENCOUNTER — Inpatient Hospital Stay
